# Patient Record
Sex: Male | Born: 1945 | Race: White | Hispanic: No | Marital: Married | Smoking: Never smoker
Health system: Southern US, Community
[De-identification: ages and names within clinical notes are randomized; demographics above are authoritative.]

## PROBLEM LIST (undated history)

## (undated) ENCOUNTER — Emergency Department (HOSPITAL_COMMUNITY): Payer: PPO | Source: Home / Self Care

## (undated) DIAGNOSIS — E782 Mixed hyperlipidemia: Secondary | ICD-10-CM

## (undated) DIAGNOSIS — E8881 Metabolic syndrome: Secondary | ICD-10-CM

## (undated) DIAGNOSIS — IMO0001 Reserved for inherently not codable concepts without codable children: Secondary | ICD-10-CM

## (undated) DIAGNOSIS — Q211 Atrial septal defect: Secondary | ICD-10-CM

## (undated) DIAGNOSIS — E1165 Type 2 diabetes mellitus with hyperglycemia: Secondary | ICD-10-CM

## (undated) DIAGNOSIS — Z8672 Personal history of thrombophlebitis: Secondary | ICD-10-CM

## (undated) DIAGNOSIS — M109 Gout, unspecified: Secondary | ICD-10-CM

## (undated) DIAGNOSIS — Z8673 Personal history of transient ischemic attack (TIA), and cerebral infarction without residual deficits: Secondary | ICD-10-CM

## (undated) DIAGNOSIS — Z86718 Personal history of other venous thrombosis and embolism: Secondary | ICD-10-CM

## (undated) DIAGNOSIS — G8929 Other chronic pain: Secondary | ICD-10-CM

## (undated) DIAGNOSIS — G4730 Sleep apnea, unspecified: Secondary | ICD-10-CM

## (undated) DIAGNOSIS — M5136 Other intervertebral disc degeneration, lumbar region: Secondary | ICD-10-CM

## (undated) DIAGNOSIS — M545 Low back pain, unspecified: Secondary | ICD-10-CM

## (undated) DIAGNOSIS — M51369 Other intervertebral disc degeneration, lumbar region without mention of lumbar back pain or lower extremity pain: Secondary | ICD-10-CM

## (undated) DIAGNOSIS — E669 Obesity, unspecified: Secondary | ICD-10-CM

## (undated) DIAGNOSIS — Z86711 Personal history of pulmonary embolism: Secondary | ICD-10-CM

## (undated) DIAGNOSIS — D6859 Other primary thrombophilia: Secondary | ICD-10-CM

## (undated) DIAGNOSIS — R413 Other amnesia: Secondary | ICD-10-CM

## (undated) DIAGNOSIS — I639 Cerebral infarction, unspecified: Secondary | ICD-10-CM

## (undated) DIAGNOSIS — Q2112 Patent foramen ovale: Secondary | ICD-10-CM

## (undated) DIAGNOSIS — I10 Essential (primary) hypertension: Secondary | ICD-10-CM

## (undated) HISTORY — DX: Metabolic syndrome: E88.81

## (undated) HISTORY — DX: Essential (primary) hypertension: I10

## (undated) HISTORY — DX: Other intervertebral disc degeneration, lumbar region without mention of lumbar back pain or lower extremity pain: M51.369

## (undated) HISTORY — DX: Sleep apnea, unspecified: G47.30

## (undated) HISTORY — DX: Gout, unspecified: M10.9

## (undated) HISTORY — DX: Other intervertebral disc degeneration, lumbar region: M51.36

## (undated) HISTORY — DX: Metabolic syndrome: E88.810

## (undated) HISTORY — DX: Low back pain, unspecified: M54.50

## (undated) HISTORY — DX: Other amnesia: R41.3

## (undated) HISTORY — DX: Atrial septal defect: Q21.1

## (undated) HISTORY — DX: Mixed hyperlipidemia: E78.2

## (undated) HISTORY — DX: Low back pain: M54.5

## (undated) HISTORY — DX: Other chronic pain: G89.29

## (undated) HISTORY — DX: Type 2 diabetes mellitus with hyperglycemia: E11.65

## (undated) HISTORY — DX: Personal history of thrombophlebitis: Z86.72

## (undated) HISTORY — DX: Patent foramen ovale: Q21.12

## (undated) HISTORY — PX: WRIST SURGERY: SHX841

## (undated) HISTORY — DX: Personal history of pulmonary embolism: Z86.711

## (undated) HISTORY — DX: Obesity, unspecified: E66.9

## (undated) HISTORY — DX: Other primary thrombophilia: D68.59

## (undated) HISTORY — DX: Reserved for inherently not codable concepts without codable children: IMO0001

## (undated) HISTORY — DX: Cerebral infarction, unspecified: I63.9

## (undated) HISTORY — DX: Personal history of transient ischemic attack (TIA), and cerebral infarction without residual deficits: Z86.73

## (undated) HISTORY — DX: Personal history of other venous thrombosis and embolism: Z86.718

---

## 1998-11-06 HISTORY — PX: COLONOSCOPY: SHX174

## 1998-11-08 LAB — HM COLONOSCOPY

## 1998-12-18 ENCOUNTER — Emergency Department (HOSPITAL_COMMUNITY): Admission: EM | Admit: 1998-12-18 | Discharge: 1998-12-18 | Payer: Self-pay | Admitting: Emergency Medicine

## 1998-12-18 ENCOUNTER — Encounter: Payer: Self-pay | Admitting: Emergency Medicine

## 2000-08-18 ENCOUNTER — Encounter: Payer: Self-pay | Admitting: Emergency Medicine

## 2000-08-19 ENCOUNTER — Inpatient Hospital Stay (HOSPITAL_COMMUNITY): Admission: EM | Admit: 2000-08-19 | Discharge: 2000-08-20 | Payer: Self-pay | Admitting: Emergency Medicine

## 2000-08-31 ENCOUNTER — Ambulatory Visit (HOSPITAL_BASED_OUTPATIENT_CLINIC_OR_DEPARTMENT_OTHER): Admission: RE | Admit: 2000-08-31 | Discharge: 2000-08-31 | Payer: Self-pay | Admitting: Internal Medicine

## 2001-01-12 ENCOUNTER — Emergency Department (HOSPITAL_COMMUNITY): Admission: EM | Admit: 2001-01-12 | Discharge: 2001-01-12 | Payer: Self-pay | Admitting: Emergency Medicine

## 2002-07-21 ENCOUNTER — Encounter: Payer: Self-pay | Admitting: Emergency Medicine

## 2002-07-21 ENCOUNTER — Emergency Department (HOSPITAL_COMMUNITY): Admission: EM | Admit: 2002-07-21 | Discharge: 2002-07-21 | Payer: Self-pay | Admitting: Emergency Medicine

## 2002-11-12 ENCOUNTER — Encounter: Payer: Self-pay | Admitting: Internal Medicine

## 2002-11-12 ENCOUNTER — Inpatient Hospital Stay (HOSPITAL_COMMUNITY): Admission: EM | Admit: 2002-11-12 | Discharge: 2002-11-19 | Payer: Self-pay | Admitting: Emergency Medicine

## 2002-11-14 ENCOUNTER — Encounter: Payer: Self-pay | Admitting: Internal Medicine

## 2003-02-11 ENCOUNTER — Emergency Department (HOSPITAL_COMMUNITY): Admission: EM | Admit: 2003-02-11 | Discharge: 2003-02-11 | Payer: Self-pay | Admitting: Emergency Medicine

## 2004-09-27 ENCOUNTER — Ambulatory Visit: Payer: Self-pay | Admitting: Internal Medicine

## 2004-11-11 ENCOUNTER — Ambulatory Visit: Payer: Self-pay | Admitting: Internal Medicine

## 2004-12-21 ENCOUNTER — Ambulatory Visit: Payer: Self-pay | Admitting: Internal Medicine

## 2005-01-02 ENCOUNTER — Ambulatory Visit: Payer: Self-pay | Admitting: Family Medicine

## 2005-01-02 ENCOUNTER — Emergency Department (HOSPITAL_COMMUNITY): Admission: EM | Admit: 2005-01-02 | Discharge: 2005-01-02 | Payer: Self-pay | Admitting: Emergency Medicine

## 2005-01-05 ENCOUNTER — Ambulatory Visit: Payer: Self-pay | Admitting: Internal Medicine

## 2005-01-13 ENCOUNTER — Ambulatory Visit: Payer: Self-pay | Admitting: Internal Medicine

## 2005-03-09 ENCOUNTER — Ambulatory Visit: Payer: Self-pay | Admitting: Internal Medicine

## 2005-04-06 ENCOUNTER — Ambulatory Visit: Payer: Self-pay | Admitting: Internal Medicine

## 2005-04-26 ENCOUNTER — Ambulatory Visit: Payer: Self-pay | Admitting: Internal Medicine

## 2005-06-21 ENCOUNTER — Ambulatory Visit: Payer: Self-pay | Admitting: Internal Medicine

## 2005-11-16 ENCOUNTER — Ambulatory Visit: Payer: Self-pay | Admitting: Internal Medicine

## 2006-02-02 ENCOUNTER — Encounter (INDEPENDENT_AMBULATORY_CARE_PROVIDER_SITE_OTHER): Payer: Self-pay | Admitting: *Deleted

## 2006-02-02 ENCOUNTER — Ambulatory Visit: Payer: Self-pay | Admitting: Internal Medicine

## 2006-02-02 LAB — CONVERTED CEMR LAB: PSA: 0.52 ng/mL

## 2006-03-09 ENCOUNTER — Ambulatory Visit: Payer: Self-pay | Admitting: Internal Medicine

## 2006-03-12 ENCOUNTER — Encounter: Admission: RE | Admit: 2006-03-12 | Discharge: 2006-03-12 | Payer: Self-pay | Admitting: Internal Medicine

## 2006-04-18 ENCOUNTER — Ambulatory Visit: Payer: Self-pay | Admitting: Internal Medicine

## 2006-06-01 ENCOUNTER — Ambulatory Visit: Payer: Self-pay | Admitting: Internal Medicine

## 2006-07-20 ENCOUNTER — Ambulatory Visit: Payer: Self-pay | Admitting: Internal Medicine

## 2006-07-24 ENCOUNTER — Ambulatory Visit: Payer: Self-pay | Admitting: Internal Medicine

## 2006-08-06 ENCOUNTER — Ambulatory Visit: Payer: Self-pay | Admitting: Internal Medicine

## 2006-08-23 ENCOUNTER — Ambulatory Visit: Payer: Self-pay | Admitting: Internal Medicine

## 2006-08-23 LAB — CONVERTED CEMR LAB: INR: 2.5 — ABNORMAL HIGH (ref 0.9–2.0)

## 2006-09-12 ENCOUNTER — Ambulatory Visit: Payer: Self-pay | Admitting: Internal Medicine

## 2006-09-12 LAB — CONVERTED CEMR LAB: Prothrombin Time: 22 s — ABNORMAL HIGH (ref 10.0–14.0)

## 2006-11-01 ENCOUNTER — Ambulatory Visit: Payer: Self-pay | Admitting: Internal Medicine

## 2006-11-01 LAB — CONVERTED CEMR LAB
AST: 25 units/L (ref 0–37)
Creatinine, Ser: 1 mg/dL (ref 0.4–1.5)
Creatinine,U: 104.2 mg/dL
HDL: 44.9 mg/dL (ref >39.0)
Hgb A1c MFr Bld: 6 % (ref 4.6–6.0)
Microalb, Ur: 0.3 mg/dL (ref 0.0–1.9)
Potassium: 4 meq/L (ref 3.5–5.1)
Triglyceride fasting, serum: 137 mg/dL (ref 0–149)

## 2006-12-03 ENCOUNTER — Ambulatory Visit: Payer: Self-pay | Admitting: Internal Medicine

## 2006-12-06 ENCOUNTER — Ambulatory Visit: Payer: Self-pay | Admitting: Internal Medicine

## 2006-12-21 ENCOUNTER — Encounter: Admission: RE | Admit: 2006-12-21 | Discharge: 2006-12-21 | Payer: Self-pay | Admitting: Internal Medicine

## 2006-12-21 ENCOUNTER — Ambulatory Visit: Payer: Self-pay | Admitting: Internal Medicine

## 2007-01-24 ENCOUNTER — Ambulatory Visit: Payer: Self-pay | Admitting: Internal Medicine

## 2007-01-24 LAB — CONVERTED CEMR LAB
INR: 2.5 — ABNORMAL HIGH (ref 0.9–2.0)
Prothrombin Time: 19.7 s — ABNORMAL HIGH (ref 10.0–14.0)

## 2007-02-28 ENCOUNTER — Ambulatory Visit: Payer: Self-pay | Admitting: Internal Medicine

## 2007-02-28 LAB — CONVERTED CEMR LAB
INR: 2.6 — ABNORMAL HIGH (ref 0.9–2.0)
Prothrombin Time: 20.4 s — ABNORMAL HIGH (ref 10.0–14.0)

## 2007-04-03 ENCOUNTER — Ambulatory Visit: Payer: Self-pay | Admitting: Internal Medicine

## 2007-04-09 ENCOUNTER — Ambulatory Visit: Payer: Self-pay | Admitting: Internal Medicine

## 2007-04-09 ENCOUNTER — Telehealth (INDEPENDENT_AMBULATORY_CARE_PROVIDER_SITE_OTHER): Payer: Self-pay | Admitting: *Deleted

## 2007-04-09 ENCOUNTER — Observation Stay (HOSPITAL_COMMUNITY): Admission: EM | Admit: 2007-04-09 | Discharge: 2007-04-10 | Payer: Self-pay | Admitting: Emergency Medicine

## 2007-04-23 ENCOUNTER — Ambulatory Visit: Payer: Self-pay | Admitting: Cardiology

## 2007-04-25 ENCOUNTER — Ambulatory Visit: Payer: Self-pay | Admitting: Internal Medicine

## 2007-05-14 ENCOUNTER — Ambulatory Visit: Payer: Self-pay | Admitting: Internal Medicine

## 2007-05-24 ENCOUNTER — Ambulatory Visit: Payer: Self-pay | Admitting: Internal Medicine

## 2007-07-30 ENCOUNTER — Ambulatory Visit: Payer: Self-pay | Admitting: Internal Medicine

## 2007-07-30 LAB — CONVERTED CEMR LAB
INR: 4.5 — ABNORMAL HIGH (ref 0.8–1.0)
Prothrombin Time: 27.1 s — ABNORMAL HIGH (ref 10.9–13.3)

## 2007-08-05 ENCOUNTER — Telehealth (INDEPENDENT_AMBULATORY_CARE_PROVIDER_SITE_OTHER): Payer: Self-pay | Admitting: *Deleted

## 2007-08-16 ENCOUNTER — Ambulatory Visit: Payer: Self-pay | Admitting: Internal Medicine

## 2007-09-18 ENCOUNTER — Ambulatory Visit: Payer: Self-pay | Admitting: Internal Medicine

## 2007-09-20 LAB — CONVERTED CEMR LAB
INR: 2.5 — ABNORMAL HIGH (ref 0.8–1.0)
Prothrombin Time: 19.8 s — ABNORMAL HIGH (ref 10.9–13.3)

## 2007-10-24 ENCOUNTER — Ambulatory Visit: Payer: Self-pay | Admitting: Internal Medicine

## 2007-10-25 LAB — CONVERTED CEMR LAB
INR: 2.3 — ABNORMAL HIGH (ref 0.8–1.0)
Prothrombin Time: 18.9 s — ABNORMAL HIGH (ref 10.9–13.3)

## 2007-11-06 ENCOUNTER — Telehealth (INDEPENDENT_AMBULATORY_CARE_PROVIDER_SITE_OTHER): Payer: Self-pay | Admitting: *Deleted

## 2007-11-07 DIAGNOSIS — Z8673 Personal history of transient ischemic attack (TIA), and cerebral infarction without residual deficits: Secondary | ICD-10-CM

## 2007-11-07 HISTORY — DX: Personal history of transient ischemic attack (TIA), and cerebral infarction without residual deficits: Z86.73

## 2007-12-05 ENCOUNTER — Telehealth (INDEPENDENT_AMBULATORY_CARE_PROVIDER_SITE_OTHER): Payer: Self-pay | Admitting: *Deleted

## 2007-12-05 ENCOUNTER — Ambulatory Visit: Payer: Self-pay | Admitting: Internal Medicine

## 2007-12-05 DIAGNOSIS — E782 Mixed hyperlipidemia: Secondary | ICD-10-CM

## 2007-12-09 ENCOUNTER — Encounter (INDEPENDENT_AMBULATORY_CARE_PROVIDER_SITE_OTHER): Payer: Self-pay | Admitting: *Deleted

## 2007-12-09 DIAGNOSIS — Z86718 Personal history of other venous thrombosis and embolism: Secondary | ICD-10-CM

## 2007-12-09 DIAGNOSIS — Z8672 Personal history of thrombophlebitis: Secondary | ICD-10-CM

## 2007-12-09 DIAGNOSIS — E8881 Metabolic syndrome: Secondary | ICD-10-CM | POA: Insufficient documentation

## 2007-12-09 DIAGNOSIS — M109 Gout, unspecified: Secondary | ICD-10-CM

## 2007-12-18 ENCOUNTER — Telehealth (INDEPENDENT_AMBULATORY_CARE_PROVIDER_SITE_OTHER): Payer: Self-pay | Admitting: *Deleted

## 2007-12-20 ENCOUNTER — Ambulatory Visit: Payer: Self-pay | Admitting: Internal Medicine

## 2007-12-24 ENCOUNTER — Encounter (INDEPENDENT_AMBULATORY_CARE_PROVIDER_SITE_OTHER): Payer: Self-pay | Admitting: *Deleted

## 2007-12-24 LAB — CONVERTED CEMR LAB: Prothrombin Time: 20.3 s — ABNORMAL HIGH (ref 10.9–13.3)

## 2008-02-21 ENCOUNTER — Ambulatory Visit: Payer: Self-pay | Admitting: Internal Medicine

## 2008-05-01 ENCOUNTER — Ambulatory Visit: Payer: Self-pay | Admitting: Internal Medicine

## 2008-05-01 LAB — CONVERTED CEMR LAB: INR: 2.1 — ABNORMAL HIGH (ref 0.8–1.0)

## 2008-05-04 ENCOUNTER — Telehealth (INDEPENDENT_AMBULATORY_CARE_PROVIDER_SITE_OTHER): Payer: Self-pay | Admitting: *Deleted

## 2008-05-07 ENCOUNTER — Ambulatory Visit: Payer: Self-pay | Admitting: Internal Medicine

## 2008-05-07 LAB — CONVERTED CEMR LAB
Bilirubin, Direct: 0.1 mg/dL (ref 0.0–0.3)
Cholesterol: 204 mg/dL (ref 0–200)
Direct LDL: 140.3 mg/dL
Total Bilirubin: 0.8 mg/dL (ref 0.3–1.2)

## 2008-05-14 ENCOUNTER — Ambulatory Visit: Payer: Self-pay | Admitting: Internal Medicine

## 2008-05-14 DIAGNOSIS — D6859 Other primary thrombophilia: Secondary | ICD-10-CM | POA: Insufficient documentation

## 2008-05-14 DIAGNOSIS — E118 Type 2 diabetes mellitus with unspecified complications: Secondary | ICD-10-CM

## 2008-05-14 DIAGNOSIS — M5416 Radiculopathy, lumbar region: Secondary | ICD-10-CM

## 2008-05-14 HISTORY — DX: Other primary thrombophilia: D68.59

## 2008-05-14 LAB — CONVERTED CEMR LAB: HDL goal, serum: 40 mg/dL

## 2008-06-11 ENCOUNTER — Telehealth (INDEPENDENT_AMBULATORY_CARE_PROVIDER_SITE_OTHER): Payer: Self-pay | Admitting: *Deleted

## 2008-06-15 ENCOUNTER — Encounter: Payer: Self-pay | Admitting: Internal Medicine

## 2008-06-18 ENCOUNTER — Ambulatory Visit (HOSPITAL_COMMUNITY): Admission: RE | Admit: 2008-06-18 | Discharge: 2008-06-18 | Payer: Self-pay | Admitting: Specialist

## 2008-06-19 ENCOUNTER — Encounter: Admission: RE | Admit: 2008-06-19 | Discharge: 2008-06-19 | Payer: Self-pay | Admitting: Specialist

## 2008-07-24 ENCOUNTER — Ambulatory Visit: Payer: Self-pay | Admitting: Internal Medicine

## 2008-07-27 ENCOUNTER — Telehealth (INDEPENDENT_AMBULATORY_CARE_PROVIDER_SITE_OTHER): Payer: Self-pay | Admitting: *Deleted

## 2008-08-14 ENCOUNTER — Ambulatory Visit: Payer: Self-pay | Admitting: Internal Medicine

## 2008-08-16 LAB — CONVERTED CEMR LAB
ALT: 27 units/L (ref 0–53)
Albumin: 3.6 g/dL (ref 3.5–5.2)
Bilirubin, Direct: 0.1 mg/dL (ref 0.0–0.3)
INR: 2.7 — ABNORMAL HIGH (ref 0.8–1.0)
Total Bilirubin: 0.8 mg/dL (ref 0.3–1.2)
Total Protein: 6.8 g/dL (ref 6.0–8.3)

## 2008-08-19 ENCOUNTER — Telehealth (INDEPENDENT_AMBULATORY_CARE_PROVIDER_SITE_OTHER): Payer: Self-pay | Admitting: *Deleted

## 2008-08-26 ENCOUNTER — Ambulatory Visit: Payer: Self-pay | Admitting: Internal Medicine

## 2008-08-26 ENCOUNTER — Inpatient Hospital Stay (HOSPITAL_COMMUNITY): Admission: EM | Admit: 2008-08-26 | Discharge: 2008-08-28 | Payer: Self-pay | Admitting: Emergency Medicine

## 2008-08-27 ENCOUNTER — Ambulatory Visit: Payer: Self-pay | Admitting: Internal Medicine

## 2008-08-27 ENCOUNTER — Ambulatory Visit: Payer: Self-pay | Admitting: Vascular Surgery

## 2008-08-27 ENCOUNTER — Encounter (INDEPENDENT_AMBULATORY_CARE_PROVIDER_SITE_OTHER): Payer: Self-pay | Admitting: Emergency Medicine

## 2008-08-28 ENCOUNTER — Encounter: Admission: RE | Admit: 2008-08-28 | Discharge: 2008-08-28 | Payer: Self-pay | Admitting: Internal Medicine

## 2008-09-01 ENCOUNTER — Telehealth (INDEPENDENT_AMBULATORY_CARE_PROVIDER_SITE_OTHER): Payer: Self-pay | Admitting: *Deleted

## 2008-09-03 ENCOUNTER — Ambulatory Visit: Payer: Self-pay | Admitting: Internal Medicine

## 2008-09-03 DIAGNOSIS — Z8679 Personal history of other diseases of the circulatory system: Secondary | ICD-10-CM

## 2008-09-03 LAB — CONVERTED CEMR LAB: INR: 3.1

## 2008-09-16 ENCOUNTER — Encounter: Payer: Self-pay | Admitting: Internal Medicine

## 2008-09-28 ENCOUNTER — Ambulatory Visit: Payer: Self-pay | Admitting: Internal Medicine

## 2008-09-28 ENCOUNTER — Encounter (INDEPENDENT_AMBULATORY_CARE_PROVIDER_SITE_OTHER): Payer: Self-pay | Admitting: *Deleted

## 2008-09-29 ENCOUNTER — Encounter (INDEPENDENT_AMBULATORY_CARE_PROVIDER_SITE_OTHER): Payer: Self-pay | Admitting: *Deleted

## 2008-09-29 ENCOUNTER — Telehealth (INDEPENDENT_AMBULATORY_CARE_PROVIDER_SITE_OTHER): Payer: Self-pay | Admitting: *Deleted

## 2008-09-29 LAB — CONVERTED CEMR LAB
Prothrombin Time: 42 s — ABNORMAL HIGH (ref 10.9–13.3)
Uric Acid, Serum: 7.1 mg/dL (ref 4.0–7.8)

## 2008-10-05 ENCOUNTER — Ambulatory Visit: Payer: Self-pay | Admitting: Internal Medicine

## 2008-10-05 DIAGNOSIS — T887XXA Unspecified adverse effect of drug or medicament, initial encounter: Secondary | ICD-10-CM

## 2008-10-05 LAB — CONVERTED CEMR LAB: INR: 2.6

## 2008-10-12 ENCOUNTER — Telehealth: Payer: Self-pay | Admitting: Internal Medicine

## 2008-10-22 ENCOUNTER — Ambulatory Visit: Payer: Self-pay | Admitting: Internal Medicine

## 2008-10-22 DIAGNOSIS — R1011 Right upper quadrant pain: Secondary | ICD-10-CM

## 2008-10-23 ENCOUNTER — Ambulatory Visit: Payer: Self-pay | Admitting: Internal Medicine

## 2008-10-23 LAB — CONVERTED CEMR LAB
AST: 23 units/L (ref 0–37)
Alkaline Phosphatase: 57 units/L (ref 39–117)
Basophils Relative: 0.2 % (ref 0.0–3.0)
Bilirubin, Direct: 0.2 mg/dL (ref 0.0–0.3)
Eosinophils Relative: 2.2 % (ref 0.0–5.0)
Lymphocytes Relative: 25.6 % (ref 12.0–46.0)
MCHC: 34.2 g/dL (ref 30.0–36.0)
MCV: 87.7 fL (ref 78.0–100.0)
Platelets: 214 10*3/uL (ref 150–400)
RBC: 4.75 M/uL (ref 4.22–5.81)
Total Bilirubin: 1.1 mg/dL (ref 0.3–1.2)
WBC: 7.2 10*3/uL (ref 4.5–10.5)

## 2008-10-25 ENCOUNTER — Emergency Department (HOSPITAL_COMMUNITY): Admission: EM | Admit: 2008-10-25 | Discharge: 2008-10-25 | Payer: Self-pay | Admitting: Emergency Medicine

## 2008-10-26 ENCOUNTER — Encounter (INDEPENDENT_AMBULATORY_CARE_PROVIDER_SITE_OTHER): Payer: Self-pay | Admitting: *Deleted

## 2008-11-02 ENCOUNTER — Telehealth: Payer: Self-pay | Admitting: Internal Medicine

## 2008-11-02 DIAGNOSIS — S381XXA Crushing injury of abdomen, lower back, and pelvis, initial encounter: Secondary | ICD-10-CM | POA: Insufficient documentation

## 2008-11-03 ENCOUNTER — Telehealth: Payer: Self-pay | Admitting: Internal Medicine

## 2008-11-03 ENCOUNTER — Encounter: Admission: RE | Admit: 2008-11-03 | Discharge: 2008-11-03 | Payer: Self-pay | Admitting: Internal Medicine

## 2008-11-04 ENCOUNTER — Ambulatory Visit: Payer: Self-pay | Admitting: Internal Medicine

## 2008-11-04 DIAGNOSIS — S32009A Unspecified fracture of unspecified lumbar vertebra, initial encounter for closed fracture: Secondary | ICD-10-CM | POA: Insufficient documentation

## 2008-11-10 ENCOUNTER — Encounter: Payer: Self-pay | Admitting: Internal Medicine

## 2008-11-16 ENCOUNTER — Telehealth (INDEPENDENT_AMBULATORY_CARE_PROVIDER_SITE_OTHER): Payer: Self-pay | Admitting: *Deleted

## 2008-11-17 ENCOUNTER — Ambulatory Visit: Payer: Self-pay | Admitting: Internal Medicine

## 2008-11-17 LAB — CONVERTED CEMR LAB
Anti Nuclear Antibody(ANA): NEGATIVE
Homocysteine: 14.1 micromoles/L (ref 4.0–15.4)

## 2008-11-24 LAB — CONVERTED CEMR LAB
Hgb A1c MFr Bld: 5.8 % (ref 4.6–6.0)
INR: 4.7 — ABNORMAL HIGH (ref 0.8–1.0)
Prothrombin Time: 46.1 s — ABNORMAL HIGH (ref 10.9–13.3)
TSH: 0.71 microintl units/mL (ref 0.35–5.50)
Total CHOL/HDL Ratio: 4.6
VLDL: 29 mg/dL (ref 0–40)
Vitamin B-12: 279 pg/mL (ref 211–911)

## 2008-11-26 ENCOUNTER — Ambulatory Visit: Payer: Self-pay | Admitting: Internal Medicine

## 2008-12-03 ENCOUNTER — Telehealth (INDEPENDENT_AMBULATORY_CARE_PROVIDER_SITE_OTHER): Payer: Self-pay | Admitting: *Deleted

## 2008-12-16 ENCOUNTER — Encounter: Payer: Self-pay | Admitting: Internal Medicine

## 2008-12-17 ENCOUNTER — Telehealth (INDEPENDENT_AMBULATORY_CARE_PROVIDER_SITE_OTHER): Payer: Self-pay | Admitting: *Deleted

## 2008-12-17 ENCOUNTER — Ambulatory Visit: Payer: Self-pay | Admitting: Internal Medicine

## 2009-01-01 ENCOUNTER — Telehealth (INDEPENDENT_AMBULATORY_CARE_PROVIDER_SITE_OTHER): Payer: Self-pay | Admitting: *Deleted

## 2009-01-29 ENCOUNTER — Encounter: Payer: Self-pay | Admitting: Internal Medicine

## 2009-02-10 ENCOUNTER — Ambulatory Visit (HOSPITAL_COMMUNITY): Admission: RE | Admit: 2009-02-10 | Discharge: 2009-02-10 | Payer: Self-pay | Admitting: Cardiology

## 2009-02-10 ENCOUNTER — Encounter (INDEPENDENT_AMBULATORY_CARE_PROVIDER_SITE_OTHER): Payer: Self-pay | Admitting: Cardiology

## 2009-02-15 ENCOUNTER — Telehealth (INDEPENDENT_AMBULATORY_CARE_PROVIDER_SITE_OTHER): Payer: Self-pay | Admitting: *Deleted

## 2009-02-16 ENCOUNTER — Encounter: Payer: Self-pay | Admitting: Internal Medicine

## 2009-02-23 ENCOUNTER — Ambulatory Visit: Payer: Self-pay | Admitting: Internal Medicine

## 2009-02-24 ENCOUNTER — Telehealth (INDEPENDENT_AMBULATORY_CARE_PROVIDER_SITE_OTHER): Payer: Self-pay | Admitting: *Deleted

## 2009-02-24 LAB — CONVERTED CEMR LAB
INR: 3.9 — ABNORMAL HIGH (ref 0.8–1.0)
Prothrombin Time: 39.1 s — ABNORMAL HIGH (ref 10.9–13.3)

## 2009-03-26 ENCOUNTER — Ambulatory Visit: Payer: Self-pay | Admitting: Internal Medicine

## 2009-04-02 ENCOUNTER — Telehealth (INDEPENDENT_AMBULATORY_CARE_PROVIDER_SITE_OTHER): Payer: Self-pay | Admitting: *Deleted

## 2009-04-07 LAB — CONVERTED CEMR LAB
ALT: 23 units/L (ref 0–53)
AST: 31 units/L (ref 0–37)
Albumin: 4 g/dL (ref 3.5–5.2)
Cholesterol: 184 mg/dL (ref 0–200)
HDL: 50.5 mg/dL (ref >39.00)
Hgb A1c MFr Bld: 5.9 % (ref 4.6–6.5)
Prothrombin Time: 27.3 s — ABNORMAL HIGH (ref 10.9–13.3)
Total Bilirubin: 1.1 mg/dL (ref 0.3–1.2)
Total Protein: 7.3 g/dL (ref 6.0–8.3)
Triglycerides: 97 mg/dL (ref 0.0–149.0)
VLDL: 19.4 mg/dL (ref 0.0–40.0)

## 2009-04-08 ENCOUNTER — Ambulatory Visit: Payer: Self-pay | Admitting: Internal Medicine

## 2009-04-08 DIAGNOSIS — Q2112 Patent foramen ovale: Secondary | ICD-10-CM | POA: Insufficient documentation

## 2009-04-08 DIAGNOSIS — G4733 Obstructive sleep apnea (adult) (pediatric): Secondary | ICD-10-CM | POA: Insufficient documentation

## 2009-04-08 DIAGNOSIS — Q211 Atrial septal defect: Secondary | ICD-10-CM

## 2009-04-08 DIAGNOSIS — G4730 Sleep apnea, unspecified: Secondary | ICD-10-CM

## 2009-04-16 ENCOUNTER — Telehealth (INDEPENDENT_AMBULATORY_CARE_PROVIDER_SITE_OTHER): Payer: Self-pay | Admitting: *Deleted

## 2009-05-06 ENCOUNTER — Ambulatory Visit: Payer: Self-pay | Admitting: Internal Medicine

## 2009-05-06 LAB — CONVERTED CEMR LAB: INR: 2.4

## 2009-07-01 ENCOUNTER — Telehealth (INDEPENDENT_AMBULATORY_CARE_PROVIDER_SITE_OTHER): Payer: Self-pay | Admitting: *Deleted

## 2009-07-09 ENCOUNTER — Ambulatory Visit: Payer: Self-pay | Admitting: Internal Medicine

## 2009-07-18 LAB — CONVERTED CEMR LAB
ALT: 21 units/L (ref 0–53)
Bilirubin, Direct: 0.1 mg/dL (ref 0.0–0.3)
HDL: 52.5 mg/dL (ref >39.00)
INR: 2.3 — ABNORMAL HIGH (ref 0.8–1.0)
LDL Cholesterol: 86 mg/dL (ref 0–99)
Prothrombin Time: 23.8 s — ABNORMAL HIGH (ref 9.1–11.7)
Total Bilirubin: 1.1 mg/dL (ref 0.3–1.2)
VLDL: 14.6 mg/dL (ref 0.0–40.0)

## 2009-07-21 ENCOUNTER — Telehealth (INDEPENDENT_AMBULATORY_CARE_PROVIDER_SITE_OTHER): Payer: Self-pay | Admitting: *Deleted

## 2009-07-22 ENCOUNTER — Ambulatory Visit: Payer: Self-pay | Admitting: Internal Medicine

## 2009-08-06 ENCOUNTER — Encounter: Payer: Self-pay | Admitting: Internal Medicine

## 2009-08-27 ENCOUNTER — Telehealth (INDEPENDENT_AMBULATORY_CARE_PROVIDER_SITE_OTHER): Payer: Self-pay | Admitting: *Deleted

## 2009-09-10 ENCOUNTER — Telehealth (INDEPENDENT_AMBULATORY_CARE_PROVIDER_SITE_OTHER): Payer: Self-pay | Admitting: *Deleted

## 2009-09-10 ENCOUNTER — Ambulatory Visit: Payer: Self-pay | Admitting: Internal Medicine

## 2009-09-10 LAB — CONVERTED CEMR LAB: Prothrombin Time: 26.7 s — ABNORMAL HIGH (ref 9.1–11.7)

## 2009-09-27 ENCOUNTER — Telehealth: Payer: Self-pay | Admitting: Internal Medicine

## 2009-11-26 ENCOUNTER — Ambulatory Visit: Payer: Self-pay | Admitting: Internal Medicine

## 2009-11-28 LAB — CONVERTED CEMR LAB: Prothrombin Time: 18.6 s — ABNORMAL HIGH (ref 9.1–11.7)

## 2009-11-29 ENCOUNTER — Telehealth (INDEPENDENT_AMBULATORY_CARE_PROVIDER_SITE_OTHER): Payer: Self-pay | Admitting: *Deleted

## 2009-12-14 ENCOUNTER — Telehealth (INDEPENDENT_AMBULATORY_CARE_PROVIDER_SITE_OTHER): Payer: Self-pay | Admitting: *Deleted

## 2009-12-23 ENCOUNTER — Ambulatory Visit: Payer: Self-pay | Admitting: Internal Medicine

## 2009-12-24 ENCOUNTER — Telehealth (INDEPENDENT_AMBULATORY_CARE_PROVIDER_SITE_OTHER): Payer: Self-pay | Admitting: *Deleted

## 2010-01-06 ENCOUNTER — Encounter: Payer: Self-pay | Admitting: Internal Medicine

## 2010-01-28 ENCOUNTER — Ambulatory Visit: Payer: Self-pay | Admitting: Internal Medicine

## 2010-01-28 ENCOUNTER — Telehealth (INDEPENDENT_AMBULATORY_CARE_PROVIDER_SITE_OTHER): Payer: Self-pay | Admitting: *Deleted

## 2010-01-28 LAB — CONVERTED CEMR LAB
INR: 2.8 — ABNORMAL HIGH (ref 0.8–1.0)
Prothrombin Time: 29.1 s — ABNORMAL HIGH (ref 9.1–11.7)

## 2010-02-07 ENCOUNTER — Encounter: Payer: Self-pay | Admitting: Internal Medicine

## 2010-03-09 ENCOUNTER — Encounter: Payer: Self-pay | Admitting: Internal Medicine

## 2010-03-17 ENCOUNTER — Encounter: Payer: Self-pay | Admitting: Internal Medicine

## 2010-03-25 ENCOUNTER — Telehealth (INDEPENDENT_AMBULATORY_CARE_PROVIDER_SITE_OTHER): Payer: Self-pay | Admitting: *Deleted

## 2010-04-22 ENCOUNTER — Telehealth (INDEPENDENT_AMBULATORY_CARE_PROVIDER_SITE_OTHER): Payer: Self-pay | Admitting: *Deleted

## 2010-05-03 ENCOUNTER — Ambulatory Visit: Payer: Self-pay | Admitting: Internal Medicine

## 2010-05-04 ENCOUNTER — Telehealth (INDEPENDENT_AMBULATORY_CARE_PROVIDER_SITE_OTHER): Payer: Self-pay | Admitting: *Deleted

## 2010-05-04 LAB — CONVERTED CEMR LAB
Creatinine, Ser: 0.8 mg/dL (ref 0.4–1.5)
Hgb A1c MFr Bld: 6 % (ref 4.6–6.5)
INR: 2.2 — ABNORMAL HIGH (ref 0.8–1.0)
Potassium: 4.9 meq/L (ref 3.5–5.1)

## 2010-05-05 ENCOUNTER — Encounter (INDEPENDENT_AMBULATORY_CARE_PROVIDER_SITE_OTHER): Payer: Self-pay | Admitting: *Deleted

## 2010-05-05 ENCOUNTER — Telehealth: Payer: Self-pay | Admitting: Family Medicine

## 2010-05-12 ENCOUNTER — Ambulatory Visit: Payer: Self-pay | Admitting: Internal Medicine

## 2010-05-13 ENCOUNTER — Telehealth (INDEPENDENT_AMBULATORY_CARE_PROVIDER_SITE_OTHER): Payer: Self-pay | Admitting: *Deleted

## 2010-06-15 ENCOUNTER — Telehealth: Payer: Self-pay | Admitting: Internal Medicine

## 2010-06-22 ENCOUNTER — Ambulatory Visit: Payer: Self-pay | Admitting: Internal Medicine

## 2010-09-01 ENCOUNTER — Ambulatory Visit: Payer: Self-pay | Admitting: Internal Medicine

## 2010-09-01 ENCOUNTER — Telehealth (INDEPENDENT_AMBULATORY_CARE_PROVIDER_SITE_OTHER): Payer: Self-pay | Admitting: *Deleted

## 2010-09-01 LAB — CONVERTED CEMR LAB: INR: 2.5 — ABNORMAL HIGH (ref 0.8–1.0)

## 2010-10-10 ENCOUNTER — Ambulatory Visit: Payer: Self-pay | Admitting: Internal Medicine

## 2010-10-11 LAB — CONVERTED CEMR LAB: Prothrombin Time: 22.4 s — ABNORMAL HIGH (ref 9.7–11.8)

## 2010-11-01 ENCOUNTER — Telehealth (INDEPENDENT_AMBULATORY_CARE_PROVIDER_SITE_OTHER): Payer: Self-pay | Admitting: *Deleted

## 2010-11-10 ENCOUNTER — Ambulatory Visit
Admission: RE | Admit: 2010-11-10 | Discharge: 2010-11-10 | Payer: Self-pay | Source: Home / Self Care | Attending: Internal Medicine | Admitting: Internal Medicine

## 2010-11-10 ENCOUNTER — Other Ambulatory Visit: Payer: Self-pay | Admitting: Internal Medicine

## 2010-11-10 ENCOUNTER — Telehealth (INDEPENDENT_AMBULATORY_CARE_PROVIDER_SITE_OTHER): Payer: Self-pay | Admitting: *Deleted

## 2010-11-10 LAB — PROTIME-INR
INR: 2.6 ratio — ABNORMAL HIGH (ref 0.8–1.0)
Prothrombin Time: 27.8 s — ABNORMAL HIGH (ref 9.7–11.8)

## 2010-11-14 ENCOUNTER — Other Ambulatory Visit: Payer: Self-pay | Admitting: Internal Medicine

## 2010-11-14 ENCOUNTER — Ambulatory Visit
Admission: RE | Admit: 2010-11-14 | Discharge: 2010-11-14 | Payer: Self-pay | Source: Home / Self Care | Attending: Internal Medicine | Admitting: Internal Medicine

## 2010-11-14 LAB — LIPID PANEL
Cholesterol: 178 mg/dL (ref 0–200)
HDL: 52.1 mg/dL (ref >39.00)
LDL Cholesterol: 105 mg/dL — ABNORMAL HIGH (ref 0–99)
Total CHOL/HDL Ratio: 3
Triglycerides: 106 mg/dL (ref 0.0–149.0)
VLDL: 21.2 mg/dL (ref 0.0–40.0)

## 2010-11-14 LAB — BUN: BUN: 14 mg/dL (ref 6–23)

## 2010-11-14 LAB — HEMOGLOBIN A1C: Hgb A1c MFr Bld: 6.4 % (ref 4.6–6.5)

## 2010-11-14 LAB — CREATININE, SERUM: Creatinine, Ser: 0.9 mg/dL (ref 0.4–1.5)

## 2010-11-14 LAB — POTASSIUM: Potassium: 4.4 mEq/L (ref 3.5–5.1)

## 2010-11-18 ENCOUNTER — Ambulatory Visit
Admission: RE | Admit: 2010-11-18 | Discharge: 2010-11-18 | Payer: Self-pay | Source: Home / Self Care | Attending: Internal Medicine | Admitting: Internal Medicine

## 2010-11-30 ENCOUNTER — Telehealth: Payer: Self-pay | Admitting: Internal Medicine

## 2010-12-08 NOTE — Progress Notes (Signed)
Summary: REFILL REQUEST  Phone Note Refill Request Call back at [phone] Message from:  Pharmacy on May 05, 2010 8:16 AM  Refills Requested: Medication #1:  GABAPENTIN 100 MG CAPS 1 q 8 hrs as needed pain   Dosage confirmed as above?Dosage Confirmed   Supply Requested: 1 month   Last Refilled: 03/25/2010 [facility]  Next Appointment Scheduled: JULY 7TH 2011 Initial call taken by: Lavell Islam,  May 05, 2010 8:16 AM  Follow-up for Phone Call        Last filled 03-25-10 #90, last Ov 07-22-09.Marland KitchenMarland KitchenMarland KitchenFelecia Deloach CMA  May 05, 2010 9:33 AM   Additional Follow-up for Phone Call Additional follow up Details #1::        needs OV.  ok for # 90, no refills Additional Follow-up by: Neena Rhymes MD,  May 05, 2010 9:35 AM    Additional Follow-up for Phone Call Additional follow up Details #2::    letter mailed .....Marland KitchenMarland KitchenFelecia Deloach CMA  May 05, 2010 10:10 AM   New/Updated Medications: GABAPENTIN 100 MG CAPS (GABAPENTIN) 1 q 8 hrs as needed pain*OFFICE VISIT DUE NOW** Prescriptions: GABAPENTIN 100 MG CAPS (GABAPENTIN) 1 q 8 hrs as needed pain*OFFICE VISIT DUE NOW**  #90 x 0   Entered by:   Jeremy Johann CMA   Authorized by:   Neena Rhymes MD   Signed by:   Jeremy Johann CMA on 05/05/2010   Method used:   Faxed to ...       [facility]* (retail)       [address]       [place], [place]  [address]       Ph: [phone]       Fax: [phone]   RxID:   [id number]

## 2010-12-08 NOTE — Letter (Signed)
Summary: Primary Care Appointment Letter  [facility] [place]  [address] [place], [place] [address]   Phone: [phone]  Fax: [phone]    05/05/2010 MRN: [id number]  Logan Valdez [address] [place], [place]  [address]  Dear Mr. SWIM,   Your Primary Care Physician Marga Melnick MD has indicated that:    __X_____it is time to schedule an appointment.    _______you missed your appointment on______ and need to call and          reschedule.    _______you need to have lab work done.    _______you need to schedule an appointment discuss lab or test results.    _______you need to call to reschedule your appointment that is                       scheduled on _________.     Please call our office as soon as possible. Our phone number is [phone]. Please press option 1. Our office is open 8a-12noon and 1p-5p, Monday through Friday.     Thank you,    [facility]

## 2010-12-08 NOTE — Progress Notes (Signed)
Summary: PT/INR Results  Phone Note Outgoing Call   Call placed by: Shonna Chock,  May 04, 2010 8:14 AM Call placed to: Patient Summary of Call: Left message on machine for patient to return call when avaliable, Reason for call:  PT/INR 2.2 (good), ? current dose of Coumadin, ? missed doses, ? chage in diet   Chrae [facility]  May 04, 2010 8:17 AM   Follow-up for Phone Call        no change ; repeat 1 month. I recommend OV before refilling Metformin as A1c is in NON DM range . Bring all glucose readings to that visit. Follow-up by: Marga Melnick MD,  May 04, 2010 8:42 AM  Additional Follow-up for Phone Call Additional follow up Details #1::        Left message for pt to return phone call. Army Fossa CMA  May 05, 2010 12:03 PM DISCUSS WITH PATIENT.............Marland KitchenFelecia Deloach CMA  May 05, 2010 5:04 PM

## 2010-12-08 NOTE — Progress Notes (Signed)
Summary: REFILL REQUEST  Phone Note Refill Request Call back at [phone] Message from:  Pharmacy on May 13, 2010 8:21 AM  Refills Requested: Medication #1:  TOPROL XL 100 MG  TB24 1/2 tab qd   Dosage confirmed as above?Dosage Confirmed   Supply Requested: 3 months   Last Refilled: 04/13/2010 [facility]  Next Appointment Scheduled: NONE Initial call taken by: Lavell Islam,  May 13, 2010 8:23 AM    Prescriptions: TOPROL XL 100 MG  TB24 (METOPROLOL SUCCINATE) 1/2 tab qd  #90 x 2   Entered by:   Shonna Chock   Authorized by:   Marga Melnick MD   Signed by:   Shonna Chock on 05/13/2010   Method used:   Electronically to        [facility]* (retail)       [address]       [place], [place]  [address]       Ph: [phone]       Fax: [phone]   RxID:   [id number]

## 2010-12-08 NOTE — Progress Notes (Signed)
Summary: refill  Phone Note Refill Request Message from:  Fax from Pharmacy on September 01, 2010 10:15 AM  Refills Requested: Medication #1:  WARFARIN SODIUM 5 MG  TABS take as directed 1 by mouth once daily except 1 1/2 on [facility] - fax [phone]  Initial call taken by: Okey Regal Spring,  September 01, 2010 10:16 AM  Follow-up for Phone Call        I spoke with patient's wife and asked who is checking PT. Last PT 06/22/10 with indication recheck in 4 weeks.  Patient's wife indicated that Dr.Hopper is monitoring PT and patient forgot to go get it rechecked and she will have him go to [facility] today for PT because tomorrow [place] are [place]. Follow-up by: Shonna Chock CMA,  September 01, 2010 10:35 AM  Additional Follow-up for Phone Call Additional follow up Details #1::        PT/INR 2.5  Taking 5mg  tab daily except 7.5mg  on Wed , patient's wife informed to have patient keep med the same and recheck in 4 week ( Mrs.Selbe indicated she will mark it on her calender)  Additional Follow-up by: Shonna Chock CMA,  September 01, 2010 2:11 PM    Prescriptions: WARFARIN SODIUM 5 MG  TABS (WARFARIN SODIUM) take as directed 1 by mouth once daily except 1 1/2 on Wed  #45 x 1   Entered by:   Shonna Chock CMA   Authorized by:   Marga Melnick MD   Signed by:   Shonna Chock CMA on 09/01/2010   Method used:   Electronically to        [facility]* (retail)       [address]       [place], [place]  [address]       Ph: [phone]       Fax: [phone]   RxID:   [id number]

## 2010-12-08 NOTE — Assessment & Plan Note (Signed)
Summary: FOLLOWUP/KN   Vital Signs:  Patient profile:   65 year old male Weight:      271.8 pounds Temp:     98.4 degrees F oral Pulse rate:   64 / minute Resp:     15 per minute BP sitting:   114 / 76  (left arm) Cuff size:   large  Vitals Entered By: Shonna Chock CMA (November 18, 2010 9:41 AM) CC: Follow-up visit: discuss labs (copy given). Patient not sure if he missed a dose of coumadin or not and would like to know if he should take a dose or just count it as a possible skipped dose. , Type 2 diabetes mellitus follow-up   CC:  Follow-up visit: discuss labs (copy given). Patient not sure if he missed a dose of coumadin or not and would like to know if he should take a dose or just count it as a possible skipped dose.  and Type 2 diabetes mellitus follow-up.  History of Present Illness: Hyperlipidemia Follow-Up      This is a 65 year old man who presents for Hyperlipidemia follow-up.  Labs reviewed & risks discussed. The patient denies muscle aches, GI upset, abdominal pain, flushing, itching, constipation, diarrhea, and fatigue.  The patient denies the following symptoms: chest pain/pressure, dypsnea, palpitations, syncope, and pedal edema.  Compliance with medications (by patient report) has been near 100%.  Dietary compliance has been good except over holidays.  The patient reports exercising occasionally.  LDL 105 on Crestor 20 mg 1/2 3X/week ; cost will go up. Type 2 Diabetes Mellitus Follow-Up      The patient is also here for Type 2 diabetes mellitus follow-up.  The patient reports weight gain of 1# over holidays, but denies polyuria, polydipsia, blurred vision, self managed hypoglycemia, and numbness of extremities.  The patient denies the following symptoms: neuropathic pain, orthostatic symptoms, poor wound healing, intermittent claudication, vision loss, and foot ulcer.  Since the last visit the patient reports not monitoring blood glucose.  Since the last visit, the patient  reports having had eye care by an ophthalmologist.  A1c was 6.4%( 137 average sugar , risk of 28%).  Allergies: 1)  ! * Indomethacin 2)  ! * Hctz 3)  ! * Lisinopril 4)  ! * Micardis 5)  ! Pravastatin Sodium (Pravastatin Sodium)  Physical Exam  General:  in no acute distress; alert,appropriate and cooperative throughout examination Lungs:  Normal respiratory effort, chest expands symmetrically. Lungs are clear to auscultation, no crackles or wheezes. Heart:  Normal rate and regular rhythm. S1 and S2 normal without gallop, murmur, click, rub. S4 Pulses:  R and L carotid,radial,dorsalis pedis and posterior tibial pulses are full and equal bilaterally Extremities:  No clubbing, cyanosis, edema, or deformity noted . Neurologic:  alert ; 11/18/2009";  sensation intact to light touch over feet.   Skin:  Intact without suspicious lesions or rashes Psych:  normally interactive, good eye contact, and not anxious appearing.     Impression & Recommendations:  Problem # 1:  DIABETES MELLITUS, CONTROLLED (ICD-250.00)  His updated medication list for this problem includes:    Metformin Hcl 500 Mg Tabs (Metformin hcl) .Marland Kitchen... 1 by mouth once daily with largest meal  Problem # 2:  HYPERLIPIDEMIA (ICD-272.2)  The following medications were removed from the medication list:    Crestor 20 Mg Tabs (Rosuvastatin calcium) .Marland Kitchen... 1/2 m,w & f His updated medication list for this problem includes:    Simvastatin 20 Mg Tabs (  Simvastatin) .Marland Kitchen... 1/2 at bedtime .Note: cost to rise  Problem # 3:  MEMORY LOSS (ICD-780.93) stable  Problem # 4:  PRIMARY HYPERCOAGULABLE STATE (ICD-289.81) Coumadin must be lifelong  Complete Medication List: 1)  Toprol Xl 100 Mg Tb24 (Metoprolol succinate) .... 1/2 tab qd 2)  Warfarin Sodium 5 Mg Tabs (Warfarin sodium) .... Take as directed 1 by mouth once daily except 1 1/2 on wed 3)  Metformin Hcl 500 Mg Tabs (Metformin hcl) .Marland Kitchen.. 1 by mouth once daily with largest meal 4)   Hydrocodone-acetaminophen 10-325 Mg/74ml Soln (Hydrocodone-acetaminophen) .Marland Kitchen.. 1 by mouth every 6 hours as needed 5)  Gabapentin 100 Mg Caps (Gabapentin) .Marland Kitchen.. 1 q 8 hrs as needed pain*office visit due now** 6)  Stool Softner  .Marland Kitchen.. 1 by mouth as needed 7)  Metanx 3-35-2 Mg Tabs (L-methylfolate-b6-b12) .Marland Kitchen.. 1 by mouth once daily 8)  Simvastatin 20 Mg Tabs (Simvastatin) .... 1/2 at bedtime  Patient Instructions: 1)  Check your blood sugars regularly. If your readings are usually above :150  or below 90 you should contact our office. 2)  It is important that your Diabetic A1c level is checked every 6 months. 3)  See your eye doctor yearly to check for diabetic eye damage. 4)  Check your feet each night for sore areas, calluses or signs of infection. 5)  Please schedule a follow-up appointment in 6 months. 6)  Hepatic Panel ; 7)  Lipid Panel ; 8)  HbgA1C ; & 9)  Urine Microalbumin prior to visit. Prescriptions: SIMVASTATIN 20 MG TABS (SIMVASTATIN) 1/2 at bedtime  #30 x 3   Entered and Authorized by:   Marga Melnick MD   Signed by:   Marga Melnick MD on 11/18/2010   Method used:   Print then Give to Patient   RxID:   [id number] METFORMIN HCL 500 MG  TABS (METFORMIN HCL) 1 by mouth once daily with largest meal  #90 x 1   Entered and Authorized by:   Marga Melnick MD   Signed by:   Marga Melnick MD on 11/18/2010   Method used:   Print then Give to Patient   RxID:   [id number]    Orders Added: 1)  Est. Patient Level IV [28413]

## 2010-12-08 NOTE — Progress Notes (Signed)
Summary: PT/INR Results   Phone Note Outgoing Call Call back at Home Phone [phone]   Call placed by: Shonna Chock CMA,  November 10, 2010 4:58 PM Call placed to: Patient Details for Reason: PT/INR 2.6 Summary of Call: Left message on machine for patient to return call when avaliable, Reason for call: no change ; recheck 4 weeks  Chrae [facility] CMA  November 10, 2010 4:58 PM   Follow-up for Phone Call        spoke w/ patient wife aware of resuts ...Marland KitchenMarland KitchenDoristine Devoid CMA  November 14, 2010 9:58 AM

## 2010-12-08 NOTE — Progress Notes (Signed)
Summary: PT/INR Results  Phone Note Outgoing Call Call back at Home Phone [phone]   Call placed by: Shonna Chock,  November 29, 2009 11:42 AM Call placed to: Patient Details for Reason: PT/INR  Summary of Call: Left message on machine for patient to return call when avaliable, Reason for call:    he needs to increase coumadin by 2.5 mg one day of week ; recheck 4 weeks  Chrae [facility]  November 29, 2009 11:44 AM    Follow-up for Phone Call        Patient takes 5mg  daily except Tues 7.5mg , Wed 2.5mg , Saturday 7.5mg    Per Dr.Hopper 7.5mg  on Tues/Sat, 5mg  all other days  and recheck in 4 weeks  Patient verbalized he understood instruction Follow-up by: Shonna Chock,  November 29, 2009 1:53 PM

## 2010-12-08 NOTE — Progress Notes (Signed)
Summary: Lab Results  Phone Note Outgoing Call Call back at [facility] Phone [phone]   Call placed by: Shonna Chock,  January 28, 2010 10:38 AM Details for Reason: INR: 2.8 Summary of Call: Left message on Home VM informing patient to keep meds the same and recheck labs in 4 weeks.   Chrae Malloy  January 28, 2010 10:39 AM

## 2010-12-08 NOTE — Progress Notes (Signed)
Summary: Refill--Gabapentin  Phone Note Refill Request Message from:  Fax from Pharmacy on June 15, 2010 1:23 PM  Refills Requested: Medication #1:  GABAPENTIN 100 MG CAPS 1 q 8 hrs as needed pain*OFFICE VISIT DUE NOW** [facility] [phone]  Initial call taken by: Okey Regal Spring,  June 15, 2010 1:24 PM    Prescriptions: GABAPENTIN 100 MG CAPS (GABAPENTIN) 1 q 8 hrs as needed pain*OFFICE VISIT DUE NOW**  #90 x 0   Entered by:   Lucious Groves CMA   Authorized by:   Marga Melnick MD   Signed by:   Lucious Groves CMA on 06/15/2010   Method used:   Faxed to ...       [facility]* (retail)       [address]       [place], [place]  [address]       Ph: [phone]       Fax: [phone]   RxID:   [id number]

## 2010-12-08 NOTE — Progress Notes (Signed)
Summary: Metformin question  Phone Note Call from Patient Call back at Home Phone [phone]   Caller: Norma Fredrickson Summary of Call: At last office visit patient was given Metformin prescription. Spouse notes that this was stopped 05/2010 (verified via patient instructions). She would like to know if this meant that he should re-start the med or continue to stay off it. Please advise. Initial call taken by: Lucious Groves CMA,  November 30, 2010 10:40 AM  Follow-up for Phone Call        leave it off ,but check A1c in 3 months(250.00) Follow-up by: Marga Melnick MD,  November 30, 2010 1:09 PM  Additional Follow-up for Phone Call Additional follow up Details #1::        Patient spouse notified. Additional Follow-up by: Lucious Groves CMA,  November 30, 2010 2:52 PM    New/Updated Medications: METFORMIN HCL 500 MG  TABS (METFORMIN HCL) 1 by mouth once daily with largest meal (D/C ed 07/11)

## 2010-12-08 NOTE — Progress Notes (Signed)
Summary: Refill Request  Phone Note Refill Request Call back at [phone] Message from:  Pharmacy on November 01, 2010 10:56 AM  Refills Requested: Medication #1:  WARFARIN SODIUM 5 MG  TABS take as directed 1 by mouth once daily except 1 1/2 on Wed   Dosage confirmed as above?Dosage Confirmed   Supply Requested: #45   Last Refilled: 10/10/2010 [facility]  Next Appointment Scheduled: none Initial call taken by: Harold Barban,  November 01, 2010 10:56 AM  Follow-up for Phone Call        I called and spoke with patient's wife and informed her appointment due 11/10/2009 or a few days before for PT/INR check and  other labs (see 05/2010 OV/Patient Instructions) Follow-up by: Shonna Chock CMA,  November 01, 2010 3:15 PM    Prescriptions: WARFARIN SODIUM 5 MG  TABS (WARFARIN SODIUM) take as directed 1 by mouth once daily except 1 1/2 on Wed  #45 x 1   Entered by:   Shonna Chock CMA   Authorized by:   Marga Melnick MD   Signed by:   Shonna Chock CMA on 11/01/2010   Method used:   Electronically to        [facility]* (retail)       [address]       [place], [place]  [address]       Ph: [phone]       Fax: [phone]   RxID:   [id number]

## 2010-12-08 NOTE — Progress Notes (Signed)
Summary: Rash- Waiting for Return Call  Phone Note Call from Patient Call back at [phone] ext [phone]   Summary of Call: received call from patient wife stating that patient has noticed small rash on legs and upper thigh thought it may be from pain medication given by another physician called pharmacy to see if manufactures had change or ingredient change which it hadn't informed wife I don't think that this may be from medication she says he was outside doing some yard work also and was having a hard time trying to convince him to get it check out. Wife ask if I would call patient at home called patient left message on machine to return call.......Marland KitchenDoristine Devoid  April 22, 2010 2:05 PM   Follow-up for Phone Call        Left message for pt to return call. Follow-up by: Josph Macho RMA,  April 25, 2010 11:19 AM  Additional Follow-up for Phone Call Additional follow up Details #1::        Spoke with patient and patient indicated that his rash is gone. Patient does not feel the need for an OV at this time. Patient was informed that if rash re-appears he needs to contact the Dr. that rx'ed the med first then [place] if he is unable to be seen by that Dr. patient agreed.  I called patient's wife at work and left message for her to return call to share what me and her husband discussed  Additional Follow-up by: Shonna Chock,  April 26, 2010 8:13 AM    Additional Follow-up for Phone Call Additional follow up Details #2::    I spoke with patient's wife and informed her what me and Mr.Vonbargen discussed earlier. Follow-up by: Shonna Chock,  April 26, 2010 4:57 PM

## 2010-12-08 NOTE — Letter (Signed)
Summary: Clarene Critchley OD  Clarene Critchley OD   Imported By: Lanelle Bal 02/14/2010 08:47:55  _____________________________________________________________________  External Attachment:    Type:   Image     Comment:   External Document

## 2010-12-08 NOTE — Assessment & Plan Note (Signed)
Summary: rto review lab/cbs   Vital Signs:  Patient profile:   65 year old male Weight:      259.4 pounds Pulse rate:   60 / minute Resp:     17 per minute BP sitting:   128 / 70  (left arm) Cuff size:   large  Vitals Entered By: Shonna Chock (May 12, 2010 2:56 PM) CC: Follow-up visit; Discuss Labs and how to take Gabapentin, Type 2 diabetes mellitus follow-up Comments REVIEWED MED LIST, PATIENT AGREED DOSE AND INSTRUCTION CORRECT    CC:  Follow-up visit; Discuss Labs and how to take Gabapentin and Type 2 diabetes mellitus follow-up.  History of Present Illness:        This is a 65 year old man who presents for Type 2 diabetes mellitus follow-up.  The patient denies polyuria, polydipsia, blurred vision, self managed hypoglycemia, and numbness of extremities.  The patient denies the following symptoms: neuropathic pain, chest pain, vomiting, orthostatic symptoms, poor wound healing, intermittent claudication, vision loss, and foot ulcer.  Since the last visit the patient reports poor dietary compliance with 16# weight gain, compliance with medications, not exercising regularly, and not monitoring blood glucose.  Since the last visit, the patient reports having had eye care by an ophthalmologist and no foot care.    Allergies: 1)  ! * Indomethacin 2)  ! * Hctz 3)  ! * Lisinopril 4)  ! * Micardis 5)  ! Pravastatin Sodium (Pravastatin Sodium)  Physical Exam  General:  well-nourished alert,appropriate and cooperative throughout examination Lungs:  Normal respiratory effort, chest expands symmetrically. Lungs are clear to auscultation, no crackles or wheezes. Heart:  normal rate, regular rhythm, no gallop, no rub, no JVD, and grade 1/2  /6 systolic murmur.   Pulses:  R and L carotid,radial,dorsalis pedis and posterior tibial pulses are full and equal bilaterally Neurologic:  alert  but unable to give date Skin:  Intact without suspicious lesions or rashes Psych:  normally  interactive, good eye contact, and not anxious appearing.     Impression & Recommendations:  Problem # 1:  DIABETES MELLITUS, CONTROLLED (ICD-250.00) A1c now  in non Diabetes range His updated medication list for this problem includes:    Metformin Hcl 500 Mg Tabs (Metformin hcl) .Marland Kitchen... 1 by mouth once daily **appointment due**  Problem # 2:  MEMORY LOSS (ICD-780.93) stable as per spouse  Complete Medication List: 1)  Toprol Xl 100 Mg Tb24 (Metoprolol succinate) .... 1/2 tab qd 2)  Warfarin Sodium 5 Mg Tabs (Warfarin sodium) .... Take as directed 1 by mouth once daily except 1 1/2 on wed 3)  Metformin Hcl 500 Mg Tabs (Metformin hcl) .Marland Kitchen.. 1 by mouth once daily **appointment due** 4)  Hydrocodone-acetaminophen 10-325 Mg/46ml Soln (Hydrocodone-acetaminophen) .Marland Kitchen.. 1 by mouth every 6 hours as needed 5)  Gabapentin 100 Mg Caps (Gabapentin) .Marland Kitchen.. 1 q 8 hrs as needed pain*office visit due now** 6)  Stool Softner  .Marland Kitchen.. 1 by mouth as needed 7)  Crestor 20 Mg Tabs (Rosuvastatin calcium) .... 1/2 m,w & f 8)  Metanx 3-35-2 Mg Tabs (L-methylfolate-b6-b12) .Marland Kitchen.. 1 by mouth once daily  Patient Instructions: 1)  Consume LESS THAN 40 grams of "sugar" /day from foods & drinks with High fructose Corn Syrup as #1,2 or #3 on label. Stop Metformin . PT/INR  recheck  in 1 month.  2)  Please schedule a follow-up appointment in 6 months. 3)  BUN,creat,K+ prior to visit, ICD-9:250.00 4)  Lipid Panel prior to  visit, ICD-9:272.2 5)  HbgA1C prior to visit, ICD-9:250.00 6)  It is important that you exercise regularly at least 20 minutes 5 times a week. If you develop chest pain, have severe difficulty breathing, or feel very tired , stop exercising immediately and seek medical attention.

## 2010-12-08 NOTE — Progress Notes (Signed)
Summary: Refill Request  Phone Note Refill Request Message from:  Pharmacy on [facility] Fax #: [phone]  Refills Requested: Medication #1:  WARFARIN SODIUM 5 MG  TABS take as directed   Dosage confirmed as above?Dosage Confirmed Initial call taken by: Harold Barban,  December 24, 2009 8:21 AM    Prescriptions: WARFARIN SODIUM 5 MG  TABS (WARFARIN SODIUM) take as directed  #45 Each x 5   Entered by:   Shonna Chock   Authorized by:   Marga Melnick MD   Signed by:   Shonna Chock on 12/24/2009   Method used:   Electronically to        [facility]* (retail)       [address]       [place], [place]  [address]       Ph: [phone]       Fax: [phone]   RxID:   [id number]

## 2010-12-08 NOTE — Progress Notes (Signed)
Summary: Appointment Concerns  Phone Note Call from Patient Call back at Home Phone [phone]   Caller: Spouse Summary of Call: Message left on voicemail: Please call     I called and spoke with patient, patient said he would like to cancle appointment here to check PT/INR and get it done at [facility]. I ok'd and will cancle appointment for here and place patient on [facility] schedule./Chrae [facility]  December 14, 2009 5:23 PM     Additional Follow-up for Phone Call Additional follow up Details #2::    patient scheduled.Marland KitchenMarland KitchenMarland KitchenBarb Merino  December 15, 2009 8:25 AM  Follow-up by: Barb Merino,  December 15, 2009 8:25 AM

## 2010-12-08 NOTE — Progress Notes (Signed)
Summary: Refill Request  Phone Note Refill Request Call back at [phone] Message from:  Pharmacy on Mar 25, 2010 8:54 AM  Refills Requested: Medication #1:  GABAPENTIN 100 MG CAPS 1 q 8 hrs as needed pain   Dosage confirmed as above?Dosage Confirmed   Supply Requested: 1 month   Last Refilled: 02/11/2010 [facility]  Next Appointment Scheduled: none Initial call taken by: Harold Barban,  Mar 25, 2010 8:54 AM    Prescriptions: GABAPENTIN 100 MG CAPS (GABAPENTIN) 1 q 8 hrs as needed pain  #90 x 0   Entered by:   Shonna Chock   Authorized by:   Marga Melnick MD   Signed by:   Shonna Chock on 03/25/2010   Method used:   Electronically to        [facility]* (retail)       [address]       [place], [place]  [address]       Ph: [phone]       Fax: [phone]   RxID:   [id number]

## 2010-12-09 ENCOUNTER — Telehealth: Payer: Self-pay | Admitting: Internal Medicine

## 2010-12-12 ENCOUNTER — Encounter: Payer: Self-pay | Admitting: Internal Medicine

## 2010-12-14 NOTE — Progress Notes (Signed)
Summary: OTC med recommendation  Phone Note Call from Patient Call back at Home Phone [phone]   Caller: Spouse-Inez  Summary of Call: Patient family member calling noting that he has a cold and they would like to know which OTC med you recommend for the patient due to his conditions and meds (coumadin). Please advise. Initial call taken by: Lucious Groves CMA,  December 09, 2010 12:15 PM  Follow-up for Phone Call        Mucinex DM, Zicam or Airborne Follow-up by: Marga Melnick MD,  December 09, 2010 1:19 PM  Additional Follow-up for Phone Call Additional follow up Details #1::        Patient spouse notified. Additional Follow-up by: Lucious Groves CMA,  December 09, 2010 3:01 PM

## 2010-12-28 NOTE — Letter (Signed)
Summary: [employer]  [employer]   Imported By: Maryln Gottron 12/22/2010 08:35:48  _____________________________________________________________________  External Attachment:    Type:   Image     Comment:   External Document

## 2011-01-02 ENCOUNTER — Telehealth (INDEPENDENT_AMBULATORY_CARE_PROVIDER_SITE_OTHER): Payer: Self-pay | Admitting: *Deleted

## 2011-01-05 ENCOUNTER — Encounter (INDEPENDENT_AMBULATORY_CARE_PROVIDER_SITE_OTHER): Payer: Self-pay | Admitting: *Deleted

## 2011-01-05 ENCOUNTER — Other Ambulatory Visit: Payer: Self-pay | Admitting: Internal Medicine

## 2011-01-05 ENCOUNTER — Other Ambulatory Visit: Payer: Medicare Other

## 2011-01-05 DIAGNOSIS — Z79899 Other long term (current) drug therapy: Secondary | ICD-10-CM

## 2011-01-05 DIAGNOSIS — Z86718 Personal history of other venous thrombosis and embolism: Secondary | ICD-10-CM

## 2011-01-05 DIAGNOSIS — Z8672 Personal history of thrombophlebitis: Secondary | ICD-10-CM

## 2011-01-05 DIAGNOSIS — T887XXA Unspecified adverse effect of drug or medicament, initial encounter: Secondary | ICD-10-CM

## 2011-01-05 LAB — PROTIME-INR: Prothrombin Time: 34.1 s — ABNORMAL HIGH (ref 10.2–12.4)

## 2011-01-06 ENCOUNTER — Telehealth: Payer: Self-pay | Admitting: Internal Medicine

## 2011-01-10 ENCOUNTER — Encounter: Payer: Self-pay | Admitting: Internal Medicine

## 2011-01-12 NOTE — Progress Notes (Signed)
Summary: Refill  Phone Note Refill Request Message from:  Pharmacy on January 02, 2011 12:05 PM  Refills Requested: Medication #1:  WARFARIN SODIUM 5 MG  TABS take as directed 1 by mouth once daily except 1 1/2 on Wed   Dosage confirmed as above?Dosage Confirmed   Supply Requested: 1 month [facility]  Next Appointment Scheduled: none Initial call taken by: Lavell Islam,  January 02, 2011 12:06 PM  Follow-up for Phone Call        I left message for patient to call and schedule PT/INR (Nurse Visit) appointment  Follow-up by: Shonna Chock CMA,  January 02, 2011 1:14 PM    Prescriptions: WARFARIN SODIUM 5 MG  TABS (WARFARIN SODIUM) take as directed 1 by mouth once daily except 1 1/2 on Wed  #45 x 0   Entered by:   Shonna Chock CMA   Authorized by:   Marga Melnick MD   Signed by:   Shonna Chock CMA on 01/02/2011   Method used:   Electronically to        [facility]* (retail)       [address]       [place], [place]  [address]       Ph: [phone]       Fax: [phone]   RxID:   [id number]   Appended Document: Refill Patient's wife indicated patient will get PT checked on Thursday at [facility] in the lab

## 2011-01-12 NOTE — Progress Notes (Signed)
Summary: PT/INR Results  Phone Note Outgoing Call Call back at Home Phone [phone]   Call placed by: Shonna Chock CMA,  January 06, 2011 8:46 AM Call placed to: Patient Details for Reason: PT/INR Results 3.4 Summary of Call: Per Dr.Hieu Herms No change and recheck in 4 weeks: I spoke with patient's wife and she ok'd results, patient is currently taking 1 tab daily except Tue/Sat 1 1/2 (5mg  tab)   .Shonna Chock CMA  January 06, 2011 8:47 AM    Dr.Indigo Barbian PT/INR was 3.4 this time, last PT/INR 2.6 on 11/10/2010. Please verify patient is to keep med the same and recheck in 4 weeks   Follow-up for Phone Call        noted Follow-up by: Marga Melnick MD,  January 06, 2011 12:55 PM

## 2011-01-24 NOTE — Letter (Signed)
Summary: [facility]  [facility]   Imported By: Kassie Mends 01/19/2011 11:03:16  _____________________________________________________________________  External Attachment:    Type:   Image     Comment:   External Document

## 2011-01-27 ENCOUNTER — Other Ambulatory Visit: Payer: Self-pay | Admitting: Internal Medicine

## 2011-01-27 MED ORDER — WARFARIN SODIUM 5 MG PO TABS: 5.0000 mg | ORAL_TABLET | Freq: Every day | ORAL | Status: DC

## 2011-02-07 ENCOUNTER — Other Ambulatory Visit (INDEPENDENT_AMBULATORY_CARE_PROVIDER_SITE_OTHER): Payer: Medicare Other

## 2011-02-07 ENCOUNTER — Telehealth: Payer: Self-pay

## 2011-02-07 DIAGNOSIS — Z8672 Personal history of thrombophlebitis: Secondary | ICD-10-CM

## 2011-02-07 DIAGNOSIS — T887XXA Unspecified adverse effect of drug or medicament, initial encounter: Secondary | ICD-10-CM

## 2011-02-07 DIAGNOSIS — E119 Type 2 diabetes mellitus without complications: Secondary | ICD-10-CM

## 2011-02-07 DIAGNOSIS — Z86718 Personal history of other venous thrombosis and embolism: Secondary | ICD-10-CM

## 2011-02-07 LAB — PROTIME-INR
INR: 2.4 ratio — ABNORMAL HIGH (ref 0.8–1.0)
Prothrombin Time: 24.8 s — ABNORMAL HIGH (ref 10.2–12.4)

## 2011-02-07 NOTE — Telephone Encounter (Signed)
Message copied by Stephan Minister on Tue Feb 07, 2011  4:44 PM ------      Message from: Marga Melnick      Created: Tue Feb 07, 2011  1:07 PM       No change; repeat in 4 weeks

## 2011-02-07 NOTE — Telephone Encounter (Signed)
Spoke with patient and he ok'd Dr.Hopper's instruction to keep meds the same and recheck in 4 weeks

## 2011-02-24 ENCOUNTER — Other Ambulatory Visit: Payer: Self-pay

## 2011-02-24 MED ORDER — WARFARIN SODIUM 5 MG PO TABS: 5.0000 mg | ORAL_TABLET | Freq: Every day | ORAL | Status: DC

## 2011-03-21 ENCOUNTER — Other Ambulatory Visit (INDEPENDENT_AMBULATORY_CARE_PROVIDER_SITE_OTHER): Payer: Medicare Other

## 2011-03-21 ENCOUNTER — Telehealth: Payer: Self-pay

## 2011-03-21 DIAGNOSIS — Z8672 Personal history of thrombophlebitis: Secondary | ICD-10-CM

## 2011-03-21 DIAGNOSIS — Z86718 Personal history of other venous thrombosis and embolism: Secondary | ICD-10-CM

## 2011-03-21 DIAGNOSIS — T887XXA Unspecified adverse effect of drug or medicament, initial encounter: Secondary | ICD-10-CM

## 2011-03-21 NOTE — Assessment & Plan Note (Signed)
[facility]                            CARDIOLOGY OFFICE NOTE   HAWLEY, MICHEL                   MRN:          [id number]  DATE:04/23/2007                            DOB:          1945/12/17    PRIMARY CARDIOLOGIST:  Dr. Gala Romney.   PRIMARY CARE PHYSICIAN:  Dr. Alwyn Ren.   PATIENT PROFILE:  A 65 year old, Caucasian male with a prior history of  nonobstructive CAD who was recently admitted June 3 to June 4 with  vertigo and atypical chest pain.   PROBLEM LIST:  1. History of chest pain.      a.     Nonobstructive coronary artery disease by cardiac       catheterization in 2004.  2. Hypertension.  3. Hyperlipidemia.  4. Type 2 diabetes mellitus.  5. Obesity.  6. Obstructive sleep apnea.  7. 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   ALLERGIES:  INDOCIN.   HISTORY OF PRESENT ILLNESS:  A 65 year old, Caucasian male who was  recently admitted to [facility] secondary to benign positional vertigo  that was successfully treated with meclizine along with atypical chest  pain that appeared for several hours over the weekend prior to  admission. He was evaluated by Dr. Felicity Coyer from Internal Medicine who  recommended the meclizine. Upon discharge, we had initially scheduled a  Myoview; however, after being seen by Dr. Samule Ohm, it was felt the  Myoview was not necessary and this was cancelled. Since his discharge,  he has done reasonably well and has only had to take 2 doses of  Meclizine. Otherwise he has not had any vertigo. He denies any chest  pain or shortness of breath and he is back to working without any  limitations.   HOME MEDICATIONS:  1. Coumadin as directed.  2. Toprol XL 50 mg daily.  3. Amaril 1 mg b.i.d.  4. Metformin 500 mg b.i.d.  5. Pravachol 40 mg daily.   PHYSICAL EXAMINATION:  VITAL SIGNS:  Blood pressure is 124/66, heart  rate 60, he weighs 293 pounds.  GENERAL:  Pleasant, white  male in no acute distress, awake, alert and  oriented x3.  NECK:  No bruits or JVD.  LUNGS:  Respirations regular and unlabored, clear to auscultation.  CARDIAC:  Regular, S1, S2, no S3, S4 or murmurs.  ABDOMEN:  Round, soft, nontender, nondistended. Bowel sounds present x4.  EXTREMITIES:  Warm, dry and pink. No clubbing or cyanosis with 1+  bilateral lower extremity edema.   ACCESSORY CLINICAL FINDINGS:  None.   ASSESSMENT/PLAN:  1. Chest pain. This is felt to be fairly atypical and he has not had      any recurrence. He has a history of nonobstructive coronary disease      by catheterization in 2004.  2. Hypertension. Stable, continue current regimen.  3. Type 2 diabetes mellitus followed by primary care.  4. Hyperlipidemia. He remains on Pravachol therapy and this is well      tolerated. His LDL in the hospital was 91.  5. Benign positional vertigo. Appears to have been  transient and he      has not really needed to take much meclizine. It is recommended      that he followup with Dr. Alwyn Ren.  6. History of deep venous thrombosis and bilateral PE. He remains on      Coumadin therapy and is followed in the Coumadin clinic.  7. Disposition. The patient to followup with Dr. Gala Romney in 3 months      or sooner if necessary.      Nicolasa Ducking, ANP  Electronically Signed      Madolyn Frieze. Jens Som, MD, [facility]  Electronically Signed   CB/MedQ  DD: 04/23/2007  DT: 04/23/2007  Job #: [id number]

## 2011-03-21 NOTE — H&P (Signed)
NAMESHEROD, CISSE            ACCOUNT NO.:  [id number]   MEDICAL RECORD NO.:  [id number]          PATIENT TYPE:  INP   LOCATION:  1844                         FACILITY:  [facility]   PHYSICIAN:  Bevelyn Buckles. Bensimhon, MDDATE OF BIRTH:  [birth year]   DATE OF ADMISSION:  04/09/2007  DATE OF DISCHARGE:                              HISTORY & PHYSICAL   Logan Valdez is a very pleasant 64 year old Caucasian gentleman with  known history of single vessel disease status post cardiac  catheterization in 2001 at which time he was found to have 20-35%  proximal stenosis of the left main coronary artery with normal left  ventricular systolic function.  No further cardiac workup since that  time.  He does have several comorbidities including morbid obesity,  hypercholesteremia, diabetes, hypertension, sleep apnea that is not  treated.  Logan Valdez presented to [facility] emergency room today.  After speaking with Dr. Frederik Pear nurse he apparently has had 2 weeks of  increased fatigue and decreased energy.  He continues, however, to work  as a Advice worker.  Sunday he stated he felt just shaky and weak.  Experienced some intermittent substernal chest pressure.  Had what he  called nearly passing out spell and some intermittent nausea.  Those  symptoms occurred on this past Sunday.  Yesterday, which would be  Monday, the patient went on to work, experienced the same sensation with  mild nausea and dizziness.  The dizziness has been intermittent since  the weekend.  Once again he felt like he might pass out.  He also  complains of some shortness of breath that started this morning.  That  is what led him to call Dr. Alwyn Ren.  He is describing pressure now on  both sides of his head also.  Currently is denying any chest discomfort.  He states the dizziness does not always occur with the chest pressure.   ALLERGIES:  Include intolerance to INDOCIN.  He thinks he had a  presyncopal episode in the past  when he was given Indocin for his back  pain.   MEDICATIONS:  1. Toprol XL 50 daily.  2. Pravastatin 40 daily.  3. Amaryl 2 mg tablet; the patient takes half a tablet b.i.d.  4. Metformin 500 mg daily.  5. Warfarin 7.5 mg daily except for 5 mg on Tuesdays and Thursdays.   PAST MEDICAL HISTORY:  1. Includes arthritis/chronic back and hip pain.  2. Diabetes type 2.  3. Hypertension.  4. Status post DVT in the past with bilateral pulmonary embolism      requiring chronic Coumadin therapy.  5. Nonobstructive coronary artery disease with a 20-30% ostial left      main coronary artery stenosis by cardiac catheterization in 2001.      Ejection fraction at that time 55%.  6. Previous history of alcohol abuse.  7. Morbid obesity.  8. Hypercholesteremia.  9. Sleep apnea.  The patient never followed up after sleep study.  10.Gout.   PAST SURGICAL HISTORY:  No surgical history.   SOCIAL HISTORY:  He lives in [place] with his wife.  He does metal work  for a living.  He has four grown children.  Denies any tobacco or use.  He states he just occasionally has a beer now.  He states he is not  compliant with his ADA diet.  Denies any drug or herbal medication use.   FAMILY HISTORY:  Mother with hypertension.  Father deceased secondary to  complications of COPD.  Brother had a MI and ended up having a bypass at  age 4.   REVIEW OF SYSTEMS:  Positive for sweats, headache, chest pressure,  dyspnea on exertion, presyncopal episodes, weakness, generalized pain in  hips, back, nausea.  All other systems negative per patient.   PHYSICAL EXAMINATION:  Temperature 97.3, heart rate 67, respirations 18,  blood pressure 151/78, weight 288 pounds, blood pressure 98% on room  air.  GENERAL:  In no acute distress, a morbidly obese 65 year old Caucasian  gentleman.  HEENT:  Normocephalic, atraumatic.  Pupils equal, round, react to light.  Sclerae is clear.  NECK:  Supple without lymphadenopathy, no  bruit, no JVD.  CARDIOVASCULAR: Reveals S1-S2, regular rate and rhythm.  LUNGS:  Clear to auscultation with distant breath sounds throughout.  SKIN:  Warm and dry.  ABDOMEN:  Soft, nontender, positive bowel sounds, protuberant.  LOWER EXTREMITIES: Without clubbing, cyanosis or edema.  NEUROLOGIC:  The patient is alert and oriented x3.   Chest x-ray is pending.  EKG:  Sinus rhythm at a rate of 64 without  acute ST or T-wave changes.   BASELINE LAB WORK:  Point of care enzymes x1 set:  Troponin less than  0.05, D-dimer within normal limits.  PT 25.8, INR 2.2, H&H 15 and 44,  WBC 7.6, platelet count 210,000, sodium 138, potassium 4.0, chloride  106, BUN 12, creatinine 0.9, glucose 126.   Dr. Nicholes Mango in to examine and assess patient with nonspecific  chest tightness in the setting of dizziness most likely secondary to  vertigo.  We will treat with meclizine at this time.  The patient will  be admitted to rule out MI.  Discussed the risks and benefits associated  with catheterization versus stress Myoview.  The patient wants to think  about it, however, agrees to be admitted.  Make the patient n.p.o. after  midnight for possible cath in the a.m.  If the patient rules out he may  choose to be discharged and proceed with a stress Myoview outpatient.  We will reevaluate in the a.m. Also will need to review results of his  chest x-ray at that time.  We will hold metformin.  Place the patient on  sliding scale insulin coverage.      Dorian Pod, ACNP      Bevelyn Buckles. Bensimhon, MD  Electronically Signed    MB/MEDQ  D:  04/09/2007  T:  04/09/2007  Job:  [id number]

## 2011-03-21 NOTE — Telephone Encounter (Signed)
Patient's wife is aware of PT/INR results and informed that he needs to schedule an appointment for his next PT/INR (call transferred to scheduler)

## 2011-03-21 NOTE — Telephone Encounter (Signed)
Message copied by Stephan Minister on Tue Mar 21, 2011  3:09 PM ------      Message from: Marga Melnick      Created: Tue Mar 21, 2011  1:26 PM       No change repeat in 4 weeks.
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
## 2011-03-21 NOTE — Discharge Summary (Signed)
NAMEBRAYLN, DUQUE            ACCOUNT NO.:  [id number]   MEDICAL RECORD NO.:  [id number]          PATIENT TYPE:  INP   LOCATION:  4733                         FACILITY:  [facility]   PHYSICIAN:  Salvadore Farber, MD  DATE OF BIRTH:  [birth year]   DATE OF ADMISSION:  04/09/2007  DATE OF DISCHARGE:  04/10/2007                               DISCHARGE SUMMARY   PRIMARY CARDIOLOGIST:  Dr. Gala Romney.   PRIMARY CARE Shizuye Rupert:  Dr. Alwyn Ren.   DISCHARGE DIAGNOSIS:  Benign positional vertigo.   SECONDARY DIAGNOSES:  1. Chest pain with history of nonobstructive coronary artery disease      by cardiac catheterization in 2004.  2. Hypertension.  3. Diabetes mellitus.  4. Hyperlipidemia.  5. Obstructive sleep apnea.  6. 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  7. Obesity.   ALLERGIES:  INDOCIN.   PROCEDURES:  None.   HISTORY OF PRESENT ILLNESS:  This is a 65 year old obese Caucasian male  with the above problem list who since this past weekend has been  experiencing intermittent midsternal chest pressure lasting hours at a  time (as much as 6 hours at a time) and resolving spontaneously.  He has  also felt shaky and weak as well as lightheaded with rotation of his  head.  His lightheadedness worsened on Monday June 2 and then that  evening he developed a headache.  On the morning of admission, June 3,  he continued to have a lightheadedness worse with rotation of his head  as well as weakness and shortness of breath and he called his primary  care Satish Hammers who recommended he present to the ED.  In the ED he was  pain free and his cardiac markers and D-dimer were normal.  He was  admitted for further evaluation.   HOSPITAL COURSE:  The patient's history seemed most consistent with  benign positional vertigo and he was written for p.r.n. meclizine, which  was felt to abate his symptoms.  He initially had a headache this  morning while he was n.p.o. as  well as while he had a nitroglycerin  patch and once he ate, his nitroglycerin patch was removed, his headache  resolved promptly.  He was not found to be orthostatic on exam and he  has had no additional chest discomfort.  His cardiac markers have  remained negative and his ECG is without any acute changes.  Despite  many hours of chest discomfort over the weekend.  We will plan to  discharge him today and have arranged for him to have a 2-day outpatient  exercise Myoview on April 15, 2007 at 9 a.m.  He will then follow-up with  Dr. Prescott Gum nurse practitioner in June 17  at 9:45 a.m.   DISCHARGE LABS:  Hemoglobin.  13.3, hematocrit 39.7, WBC 7.5, platelets  182.  PT 25.3, INR 2.2, PTT 44.  Sodium 137, potassium 3.9, chloride  100, CO2 27, BUN 12, creatinine 0.93, glucose 82, total bilirubin 0.9,  alkaline phosphatase 51, AST 24, ALT 22, total protein 6.1, albumin 3.3,  calcium 8.7, hemoglobin A1c  6.1.  Cardiac enzymes are negative x3.  Total cholesterol 155, triglycerides 110, HDL 42, LDL 91.  TSH 1.950.   DISPOSITION:  The patient is being discharged home today in good  condition.   FOLLOW-UP APPOINTMENTS:  He has an outpatient exercise Myoview on June 9  at 9:00 a.m..  He has been advised to hold his beta-blocker that morning  as well as to remain n.p.o. after midnight.  He has follow-up with Dr.  Prescott Gum nurse practitioner in June 17 at 9:45 a.m. He has follow-up  with Dr. Alwyn Ren in approximately 1 week for further follow-up regarding  vertigo.   DISCHARGE MEDICATIONS:  1. Coumadin as previously prescribed.  2. Toprol XL 50 mg daily.  3. Amaryl 1 mg b.i.d.  4. Metformin 500 mg b.i.d.  5. Pravachol 40 mg daily.  6. Nitroglycerin 0.4 mg sublingual p.r.n. chest pain.  7. Meclizine 25 mg t.i.d. p.r.n. vertigo.   PENDING STUDIES:  None.   DURATION DISCHARGE ENCOUNTER:  Sixty minutes including physician time.      Nicolasa Ducking, ANP      Salvadore Farber, MD   Electronically Signed    CB/MEDQ  D:  04/10/2007  T:  04/11/2007  Job:  [id number]   cc:   Peyton Najjar, MD

## 2011-03-24 NOTE — Cardiovascular Report (Signed)
[place]. [facility]  Patient:    Logan Valdez, Logan Valdez                   MRN: [id number] Proc. Date: 08/20/00 Adm. Date:  [id number] Attending:  Tresa Garter CC:         Corwin Levins, M.D. [facility]  Daisey Must, M.D. [facility]   Cardiac Catheterization  DATE OF BIRTH:  07-Jan-1946  REFERRING PHYSICIAN:  Dr. Jonny Ruiz, phone number [phone]  CARDIOLOGIST:  Dr. Loraine Leriche Pulsipher  PROCEDURES PERFORMED: 1. Left heart catheterization with selective coronary angiography. 2. Left ventriculography.  DIAGNOSES: 1. Nonobstructive coronary artery disease, as outlined below. 2. Normal left ventricular systolic function.  HISTORY:  [name] is a 65 year old white male with multiple risk factors for coronary artery disease.  The patient was admitted with atypical chest pain.  He has ruled out for myocardial infarction by enzymes.  He has been referred for a diagnostic catheterization to assess his coronary anatomy.  DESCRIPTION OF PROCEDURE:  After informed consent was obtained, the patient was brought to the catheterization laboratory.  The right groin was sterilely prepped and draped.  Lidocaine 1% was used to infiltrate the right groin and a 6 French arterial sheath was placed using the modified Seldinger technique. Subsequently a 6 [place] and JR4 catheters were used to engage the left and right coronary arteries.  Selective angiography was then performed in various projections using manual injections of contrast.  After coronary angiography, attention was turned to ventriculography.  A 6 French angled pigtail catheter was placed in the left ventricle.  Appropriated left-sided hemodynamics were obtained.  Subsequently ventriculography was performed in single plane RAO projection using power injection of contrast.  After ventriculography, the pigtail catheter was removed.  At the termination of the case, the catheters and sheaths were removed  and manual pressure applied until adequate hemostasis was achieved.  The patient tolerated the procedure well and was transferred to floor in stable condition.   FINDINGS:  HEMODYNAMICS: 1. Left ventricular pressure 134/19 mmHg. 2. Aortic pressure 133/76 mmHg.  VENTRICULOGRAPHY: 1. Ejection fraction approximately 55%, no segmental wall abnormalities.  2. 2. There was no mitral regurgitation.  LEFT MAIN CORONARY ARTERY:  There appears to be a 20-35% proximal stenosis off the left main coronary artery.  Mild calcification has been noted.  However, there was no pressure dampening upon engagement of the Judkins catheter, and there was normal efflux of dye upon contrast injection.  Stenosis not felt to be hemodynamically significant.  LEFT ANTERIOR DESCENDING ARTERY:  Proximal segment gives rise to two septal perforators.  The LAD proper is rather small.  After the second septal perforator, the LAD bifurcates into a small LAD proper and a large diagonal branch.  There is no obvious flow-limiting coronary artery disease.  LEFT CIRCUMFLEX CORONARY ARTERY:  Demonstrates no evidence of flow-limiting coronary artery disease.  This is a moderate caliber vessel.  RIGHT CORONARY ARTERY:  This was a large vessel.  Giving rise to a large posterior descending artery and a very large posterolateral branch.  There was no evidence of flow-limiting coronary artery disease in the right coronary artery proper or at terminal branches.  CONCLUSION: 1. Nonobstructive coronary artery disease with a 20-30% ostial left main    coronary artery stenosis. 2. Normal LV systolic function. 3. Mildly elevated left ventricular and diastolic pressure.  RECOMMENDATIONS:  The results have been discussed with Dr. Gerri Spore.  The patients chest pains are felt  to be atypical as ______ findings by angiography do not explain his chest pain syndrome.  The plan is to refer the patient to Dr. Jonny Ruiz for further work-up of  his chest pain syndrome.  The patient will be discharged later today. DD:  08/20/00 TD:  08/20/00 Job: [id number]

## 2011-03-24 NOTE — H&P (Signed)
[place]. [facility]  Patient:    Logan Valdez, Logan Valdez                   MRN: [id number] Adm. Date:  [id number] Attending:  Tresa Garter CC:         Corwin Levins, M.D. [facility]   History and Physical  DATE OF BIRTH: Sep 18, 1946  CHIEF COMPLAINT: Chest pain.  HISTORY OF PRESENT ILLNESS: The patient is a 65 year old male who started to develop a heavy sensation in the chest as well as burning, sweating, and some shortness of breath around 5 p.m.  This lasted for several hours.  He presented to the emergency room, and the pain has resolved.  He took three aspirin at home.  He also noted that his left arm and hand were numb for one week.  His symptoms were not exertional.  PAST MEDICAL HISTORY:  1. Chest pain one year ago, work-up negative.  Cardiolite study, according to     the patient.  2. Elevated blood pressure diagnosed by Dr. Jonny Ruiz three to four weeks ago.  He     was started on Altace.  3. Borderline sugar.  No treatment.  CURRENT MEDICATIONS: Altace 5 mg for one month.  ALLERGIES: No known drug allergies.  FAMILY HISTORY: Brother had MI and had bypass surgery in his early 56s. Father has coronary artery disease.  SOCIAL HISTORY: He has never smoked.  He is married.  He works as a Fish farm manager. He does not drink alcohol.  REVIEW OF SYSTEMS: He has been overweight.  Some indigestion.  No syncope.  No urinary complaints.  No blood in the stool or black stools.  Remainder of systems negative.  PHYSICAL EXAMINATION:  VITAL SIGNS: Blood pressure 172/82, pulse 80, respirations 32, temperature 98.6 degrees.  Oxygen saturation 97%.  Recheck of vital signs showed a blood pressure of 134/70, respirations 18.  GENERAL: He is in no acute distress.  He wears a wristwatch on the left wrist with a tight bracelet.  HEENT: Mucosa moist.  NECK: Supple, no thyromegaly or bruits.  CHEST: Lungs clear to auscultation and percussion.  Decreased breath  sounds. Chest wall nontender.  HEART: No distant tones.  S1 and S2.  ABDOMEN: Obese.  Soft, nontender.  No organomegaly or masses felt.  EXTREMITIES: Lower extremities show trace edema.  Calves nontender.  SKIN: Aging changes noted.  NEUROLOGIC: He is alert and cooperative.  Cranial nerves nonfocal.  LABORATORY DATA: EKG was normal.  Chest x-ray showed no infiltrates; cardiomegaly.  Potassium 3.6.  Hemoglobin 13.2.  CK 49.  Troponin 0.01.  ASSESSMENT/PLAN:  1. Chest pain, cardiac versus gastrointestinal related.  Obtain troponin x 3,     CPK x 3, and obtain EKG in the morning.  Obtain cardiology consultation to     help with further work-up.  Will continue with aspirin.  I will not start     him on heparin at this point.  2. Elevated blood pressure, probable hypertension.  Continue with Altace at     current dosage.  3. Diabetes mellitus, likely diet controlled at present.  Obtain hemoglobin     A1C and check CBG b.i.d.  Dietary consultation.  4. Obesity.  Dietary consultation.  5. Possible obstructive sleep apnea.  Will probably need to address this as     an outpatient.  6. 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  Will     monitor. DD:  08/19/00  TD:  08/19/00 Job: [id number]

## 2011-03-24 NOTE — H&P (Signed)
NAME:  Logan Valdez, Logan Valdez                      ACCOUNT NO.:  [id number]   MEDICAL RECORD NO.:  [id number]                   PATIENT TYPE:  INP   LOCATION:  3730                                 FACILITY:  [facility]   PHYSICIAN:  Titus Dubin. Alwyn Ren, M.D. [facility]         DATE OF BIRTH:  Aug 02, 1946   DATE OF ADMISSION:  11/12/2002  DATE OF DISCHARGE:                                HISTORY & PHYSICAL   HISTORY:  The patient is a 65 year old white male admitted with shortness of  breath in the context of left lower extremity edema with a questionable  Homan's sign with an asthmatic component.   He was seen on 11/03/2002 following an onset of cough on 12/28. This was  associated with headache and chest congestion. He had sweats and chills the  evening of 12/28, but the temperature was up to 101 or greater. The headache  was described as throbbing in the temples and the frontal sinuses. It was  aggravated by the nonproductive cough. He was having some paroxysmal  nocturnal dyspnea due to the cough. Tessalon Perles was of no benefit.  Temperature was 101. The oropharynx revealed mild erythema. The tympanic  membranes were dull. Narrow walls at the bases, but no increased work of  breathing. An S4 gallop was noted and he was placed on Tequin for 7 days and  given Anaplex-HD and was told to use Tylenol or Advil every four hours for  fever. Tequin was employed as he had been on amoxicillin in October 2003 for  bronchitis and possible strep pharyngitis.   He finished the Tequin on 1/4 and actually returned to work on 1/5. On the  evening of 1/6 he noticed swelling of the left lower extremity and a pain.  His wife felt that he had broken a blood vessel as she noted and  erythematous lesion on the left posterior calf. This morning approximately 6  a.m. he had acute shortness of breath upon awakening. He also had a  paroxysmal nonproductive cough this morning associated with heaviness in his  chest. When  he walked outside he had symptoms of exercise induced  bronchospasm following exposure to the cold.   He came to the office and was found to have oxygen saturations in the mid  80s with ambulation. He was found to have an equivocal Homan's sign on the  left and diffuse wheezing. He was admitted to rule out pulmonary and  thromboemboli and deep venous thrombosis.   PAST MEDICAL HISTORY:  His past medical history reveals a catheterization in  October 2001. He had less than a 30% lesion in the left main. He had a  repair of the right wrist over three decades ago after trauma. He had a  colonoscopy in 2000.   Medical problems include hyperlipidemia, weight excess, sleep apnea, and  gout.   FAMILY HISTORY:  Positive for cancer in his maternal aunt of unknown  primary, diabetes, hypertension, bypass surgery,  and myocardial infarction  in his brother, and emphysema in his father.   SOCIAL HISTORY:  He has never smoked. He drinks occasionally.   ALLERGIES:  He has no known drug allergies. He is questionably allergic to  indomethacin as he experienced near syncope on one occasion when he took  this.   MEDICATIONS:  He has been on Bayer aspirin 325 mg daily and took this the  morning of his office visit. He is also on Toprol XL 100 one-half daily.   REVIEW OF SYMPTOMS:  Review of systems reveals some heartburn recently. He  has had no angina, but the vague heaviness. The remainder of the review of  systems was checked and was negative. He questioned whether the pain in the  calf was related to gout.   He had lost six pounds over the holidays and related it to better dietary  discretion.   PHYSICAL EXAMINATION:   GENERAL:  He appeared deconditioned and mildly acutely ill.   VITAL SIGNS:  Weight 276, temperature 97.3, pulse 88, respiratory rate 24,  blood pressure 142/82.   HEENT:  There was some edema of the lids without frank angioedema. Arterial  narrowing is present.  Otololaryngologic exam was essentially negative.   NECK:  Carotid was normal to palpation.   LUNGS:  He had diffuse wheezing in all lung fields and an S4 gallop was  noted.   EXTREMITIES:  Trace to one-half plus edema was noted. A positive Homan's  sign was present in the left lower extremity. There was mild erythema over  the left superior calf, which appeared to be vascular. It did blanch with  pressure. There was full range of motion with no definite musculoskeletal  deficit.   ABDOMEN:  He also had somewhat of a livedo pattern over his abdomen. The  abdomen was massive, but nontender. He had no lymphadenopathy or  organomegaly.   GENITAL/RECTAL:  Deferred.   NEUROLOGIC:  He had essentially normal neurologic/psychiatric exam with no  deficits.   PLAN:  He is now admitted with shortness of breath in the context of left  lower extremity edema and questionable Homan's sign suggestive of possible  deep venous thrombosis or pulmonary emboli. The differential would include  asthmatic bronchitis following his bronchitic episode, which has been  treated with Tequin.   He will be admitted with a spiral CT and heparin coverage pending these  results. He will receive aggressive pulmonary toilet.                                                 Titus Dubin. Alwyn Ren, M.D. [facility]    [facility]/MEDQ  D:  11/12/2002  T:  11/12/2002  Job:  [id number]

## 2011-03-24 NOTE — Discharge Summary (Signed)
NAME:  Logan Valdez, Logan Valdez                      ACCOUNT NO.:  [id number]   MEDICAL RECORD NO.:  [id number]                   PATIENT TYPE:  INP   LOCATION:  3730                                 FACILITY:  [facility]   PHYSICIAN:  Rene Paci, M.D. [facility]          DATE OF BIRTH:  Dec 25, 1945   DATE OF ADMISSION:  11/12/2002  DATE OF DISCHARGE:  11/19/2002                                 DISCHARGE SUMMARY   DISCHARGE DIAGNOSES:  1. Bilateral pulmonary embolus.  2. Left lower extremity deep vein thrombosis.  3. Lupus anticoagulant positive.  4. Thrombocytopenia.  5. Gastritis.   BRIEF ADMISSION HISTORY:  The patient is a 65 year old white male who  presents with shortness of breath and left lower extremity edema.  The  patient had been seen in the office on November 03, 2002 with the onset of  cough.  The patient was treated with Tessalon Perles without improvement.  He was also given Anaplex HD, as well as Tylenol.  The patient was then  started on Tequin.  He completed his Tequin on November 09, 2002.  He returned  to work on November 10, 2002.  He then noted swelling in his left leg on  November 11, 2002.  He woke at 6 a.m. on the morning of admission acutely  short of breath.   PAST MEDICAL HISTORY:  1. Single-vessel coronary artery disease by catheterization in October 2001     with a 30% lesion in the left main.  2. History of colonoscopy in 2000.  3. Hyperlipidemia.  4. Obesity.  5. Sleep apnea.  6. Gout.   HOSPITAL COURSE:  Problem 1:  BILATERAL PULMONARY EMBOLUS AND LEFT LOWER  EXTREMITY DEEP VEIN THROMBOSIS:  The patient presented with acute shortness  of breath and left lower extremity swelling suspicious for a pulmonary  embolus.  He did have a spiral CT of the chest.  This confirmed bilateral  pulmonary embolus and a left lower extremity DVT.  The patient was started  on heparin and Coumadin.  The patient was also noted to be thrombocytopenic.  This prompted a  hematology/oncology evaluation.  The patient was evaluated  by Dr. Myna Hidalgo.  He made recommendations to rule out anticardiolipin  antibody syndrome, a hereditary hypercoagulable state, malignancies.  In  regards to his thrombocytopenia, he thought he might have a possible bone  marrow production problem and may need a bone marrow biopsy at some point.  The patient's lupus anticoagulant came back positive.  His workup was  otherwise unremarkable.  Dr. Myna Hidalgo felt the patient would require lifelong  Coumadin therapy.  He also made recommendations for the patient's INR to be  between 3-3.5 for the next year.  The patient has received seven days of  heparin at this time.  He is anticoagulated, although he is not quite at his  goal of 3-3.5 yet.   Problem 2:  GASTROINTESTINAL:  The patient has had a nonproductive cough  and  heartburn and indigestion.  This prompted a GI evaluation.  The patient  underwent endoscopy on November 17, 2002.  This revealed gastritis.  They  recommended a proton pump inhibitor b.i.d. for the time being.   Problem 3:  ADULT ONSET DIABETES MELLITUS:  This is a new diagnosis and  stable.   DISCHARGE LABORATORY DATA:  Pro time was 24.6, INR was 2.6.  CBC was normal  and his platelet count was 192, up from 53,000.  Stool for blood was  negative.  D-dimer was greater than 20.  Antithrombin III was 75.  Antiphospholipid evaluation and PTT LA was high at 200, PTT LA confirmation  was high at 23.6, DRVTT was elevated at 68.7, DRVTT confirmation was  elevated at 1.5.  Lupus anticoagulant was detected.  Protein C was 84,  protein S was elevated at 160.  Hemoglobin A1c was 6.8%.  LFT's were normal.  Renal function was normal.  Total cholesterol was elevated at 207,  triglycerides 132, HDL 51, LDL 130.   DISCHARGE MEDICATIONS:  1. Coumadin 3 mg 2 tablets until further instructed.  2. Toprol XL 1/2 tablet daily.  3. Actos 15 mg daily.  4. Amaryl 2 mg daily.  5. Protonix 40  mg b.i.d.  6. Advair 250/50 one puff b.i.d.  7. Singulair 10 mg daily.  8. The patient has been instructed not to take aspirin.   FOLLOW UP:  Follow up with Dr. Alwyn Ren on Wednesday, November 26, 2002 at 11  a.m. and Dr. Frederik Pear office on Friday, November 21, 2002 for a pro time.     Cornell Barman, P.A. [facility]                  Rene Paci, M.D. [facility]    LC/MEDQ  D:  11/19/2002  T:  11/19/2002  Job:  [id number]   cc:   Rose Phi. Myna Hidalgo, M.D.  [address] [facility]  [place], [place] [address]  Fax: [phone]   Titus Dubin. Alwyn Ren, M.D. [facility]   Lina Sar, M.D. [facility]

## 2011-03-24 NOTE — Discharge Summary (Signed)
[place]. [facility]  Patient:    Logan Valdez, MENEES                   MRN: [id number] Adm. Date:  [id number] Disc. Date: 08/20/00 Attending:  Tresa Garter CC:         Corwin Levins, M.D. [facility]   Discharge Summary  ADMISSION DIAGNOSES: 1. Chest pain, cardiac versus esophageal reflux. 2. Hypertension. 3. Hyperglycemia. 4. Weight excess. 5. Possible obstructive sleep apnea. 6. Neuropathy.  DISCHARGE DIAGNOSES: 1. Chest pain, cardiac versus esophageal reflux. 2. Hypertension. 3. Hyperglycemia. 4. Weight excess. 5. Possible obstructive sleep apnea. 6. Neuropathy.  OPERATIONS:  Coronary artery catheterization on August 20, 2000, revealing 20 to 30% proximal stenosis of the left main and an ejection fraction of 55%.  COMPLICATIONS:  None.  BRIEF HISTORY:  [name] is a 65 year old white male who began to experience achable chest pain associated with diaphoresis.  Additionally, he had numbness and tingling in the location of the left ulnar nerve for one week.  Significantly, a brother has had bypass grafting at age 49, his father had coronary artery disease.  He has never smoked.  He has had a weight problem, and is being evaluated for possible sleep apnea.  He also has had hypertension.  LABORATORY DATA:  Admitting EKG was normal.  Chest x-ray revealed no active disease, but there was some suggestion of possible cardiomegaly.  Potassium was low normal at 3.6.  Hemoglobin was 13.2.  Serial CPKs and MBs were within normal limits.  Glucose was 160; hemoglobin A1C was 6.6%.  Lipid profile revealed a total cholesterol of 198, triglycerides of 62, HDL of 45, and LDL of 141.  TSH was therapeutic at 2.43.  He continued to have the ulnar radiation of pain suggesting ulnar neuropathy. His chest pain resolved.  He underwent catheterization because of the strong family history and his multiple risk factors.  The findings were noted as  above.  He was to be discharged on Protonix 40 mg q.a.m., Altace 10 mg daily, enteric-coated aspirin 325 mg once daily with a meal.  He does have a sleep apnea evaluation pending, and this should be completed.  At present, his risks were identified and discussed with him in detail.  A weight over 200 would be a risk, as would an LDL over 130, and glucose over 115.  Additionally, lack of exercise 3 to 4 times a week for 30 to 45 minutes constitutes reversible risk factor.  He does not have uncontrolled hypertension at this time, as his blood pressure is 129/74 on Altace 10 mg daily.  He also has never smoked.  He will be discharged on nutrition consultation with 1800 to 2200 calorie ADA diet, low cholesterol.  Weight loss to 200 was recommended.  He will be given a reference to Dr. Cathi Roan, Drink, and Be Healthy.  Graduated cardiovascular exercise would be recommended.  PROGNOSIS:  Good if he addressed these issues.  Progressive low back pain, he may wish to use a treadmill, stationary bike, or water aerobics as a form of exercise.  He will be asked to see Dr. Melvyn Novas in two to three weeks for follow up.  ACTIVITIES:  Ad lib.  DIET:  As noted. DD:  08/20/00 TD:  08/20/00 Job: [id number]

## 2011-03-24 NOTE — Consult Note (Signed)
NAME:  Logan Valdez, Logan Valdez                      ACCOUNT NO.:  [id number]   MEDICAL RECORD NO.:  [id number]                   PATIENT TYPE:  INP   LOCATION:  3730                                 FACILITY:   PHYSICIAN:  Rose Phi. Myna Hidalgo, M.D.              DATE OF BIRTH:  [birth year]   DATE OF CONSULTATION:  11/13/2002  DATE OF DISCHARGE:                                   CONSULTATION   REASON FOR CONSULTATION:  1. Pulmonary embolism/deep venous thrombosis.  2. Thrombocytopenia.   HISTORY OF PRESENT ILLNESS:  Logan Valdez is a 65 year old white gentleman  who is very nice.  He was with his wife when I spoke with him this morning.  He has a history of sleep apnea, morbid obesity, elevated lipids.  He did  have a cardiac catheterization two years ago which showed nonobstructive  coronary artery disease.  He has been sick for several weeks.  He keeps  getting recurrent lung infections.  He recently had completed I think  seven days of Tequin.  He noticed swelling in the left lower leg.  He  thought this was another gout attack.  He then woke up the morning of the  7th with diaphoresis.  He was coughing severely.  He was not coughing up  blood.  He was having a hard time catching his breath.  He went to see Dr. Alwyn Ren.  Dr. Alwyn Ren immediately sent him to the emergency  room.  He subsequently underwent a CT scan.  CT scan showed bilateral pulmonary  emboli.  He also had a left lower extremity DVT in the peroneal vein.  He  was started on heparin.  Interestingly enough, he had a CBC done with a white count of 7.3,  hemoglobin 14.7, platelet count 53,000.  A metabolic panel was normal.  He  had an elevated D-dimer greater than 20.  Troponin was mildly elevated.  His  PT and PTT were normal.  Again heparin was initiated.  He has had some routine hypercoagulable  studies done which are not back.  We were subsequently asked to evaluate  him.   PAST MEDICAL HISTORY:  1. Sleep apnea.  2.  Morbid obesity.  3. Hyperlipidemia.  4. Borderline hypertension and diabetes.   ALLERGIES:  INDOCIN.   MEDICATIONS:  1. Albuterol nebulizer q. four hours.  2. Aspirin 325 mg daily.  3. Decadron taper.  4. Toprol XL 50 mg daily.  5. Protonix 40 mg daily.   SOCIAL HISTORY:  He has no history of tobacco use.  There is no alcohol use.  He works in a Insurance claims handler as a Merchandiser, retail.  He has no obvious occupational  exposures.   FAMILY HISTORY:  Remarkable for coronary artery disease.  His brother died  of coronary artery disease.  Another brother has polymyositis.  There is  history of COPD.   REVIEW OF SYSTEMS:  Remarkable for a six-pound weight loss.  He  has had a  fever and sweats. There has been no chills.  He has had chronic fatigue.  He  has not noted any diarrhea. There has been no obvious bleeding.  He has had  gouty attacks.   PHYSICAL EXAMINATION:  GENERAL:  This is an obese white gentleman in no  obvious distress.  VITAL SIGNS:  Temperature 99.1, pulse 84, respiratory rate 20, blood  pressure 150/100.  HEENT:  Normocephalic, atraumatic skull.  No ocular or oral lesions.  Pupils  react appropriately.  NECK:  No adenopathy in the neck.  LUNGS:  Decreased breath sounds diffusely.  There are a couple of expiratory  wheezes.  CARDIAC:  Regular rate and rhythm with a normal S1 and S2.  There are no  murmurs, rubs or bruits.  ABDOMEN:  Obese.  Abdomen is soft.  He has good  bowel sounds.  There is no obvious fluid wave.  There is no palpable  hepatosplenomegaly.  EXTREMITIES:  No pitting edema in the left lower extremity.  He does have  some slight tenderness to palpation of the left calf.  He has no gouty  changes in his joints.  SKIN:  Questionable petechial type lesions on his left lower extremity.  NEUROLOGIC:  No focal neurological deficits.   LABORATORY DATA:  His peripheral smear shows a normochromic, normocytic  population of red blood cells.  There may be some  slightly low rouleaux  formation.  No schistocytes are noted.  He has no nucleated red blood cells.  White cells appear with mildly increased hypersegmented polys.  There are no  immature mono____ or lymphoid forms.  I do not see any plasma cells.  Platelets are decreased in number.  Platelets are small in size.   IMPRESSION AND RECOMMENDATIONS:  Logan Valdez is a 65 year old gentleman  with deep venous thrombosis and pulmonary embolism.  He has no obvious risk  factor.  He does have sleep apnea.  He has been somewhat sedentary over the  past couple of weeks because of recurrent chest colds.  The fact that he had thrombocytopenia is quite interesting.  I would really  have to focus on him possibly having anticardiolipin antibody syndrome.  We  will send off the appropriate tests for this.   I think that it would be unusual for him to have a hereditary  hypercoagulable state. We will send off the appropriate studies to rule out  a hereditary hypocoagulable condition.  I do not think he has underlying malignancy.  This, however, strongly could  be looked at.  A CT scan of the abdomen and pelvis would be indicated.  Note, he does have heme negative stools.   The platelets being small would indicate a possible bone marrow production  problem.  I do not see any obvious medications that he is on that would lead  to marrow dysfunction.  As such, we may have to embark on a bone marrow  biopsy on him if our blood tests are negative for the etiology of his  emboli/thrombosis.   He does not need aspirin.  He is going to be on therapeutic anticoagulation  for at least a year and probably longer.  As such, I would discontinue his  aspirin.  As far as anticoagulation, I would go ahead and get him on Coumadin.  We can  certainly let the pharmacy manage his anticoagulation while he is in the  hospital.  I had a nice talk with Logan Valdez and his wife.  They are very nice  folks.  I told him my  thoughts.  He understands the possibilities with  regards to making a diagnosis.  We will plan to await results of our blood tests.  Results of our blood  tests will not affect his management.  Once his prothrombin time is about  with an INR of 3, I think he could be discharged.   Thank you so much for allowing [place] to see Logan Valdez.  We will follow him  closely.                                                  Rose Phi. Myna Hidalgo, M.D.    PRE/MEDQ  D:  11/13/2002  T:  11/13/2002  Job:  [id number]   cc:   Rene Paci, M.D. [facility]  [address]  [place], [place] [address]  Fax: 1   Titus Dubin. Alwyn Ren, M.D. [facility]

## 2011-04-06 ENCOUNTER — Other Ambulatory Visit: Payer: Self-pay | Admitting: Internal Medicine

## 2011-04-06 MED ORDER — WARFARIN SODIUM 5 MG PO TABS: 5.0000 mg | ORAL_TABLET | Freq: Every day | ORAL | Status: DC

## 2011-04-06 NOTE — Telephone Encounter (Signed)
RX sent to pharmacy  

## 2011-04-21 ENCOUNTER — Other Ambulatory Visit: Payer: Self-pay | Admitting: Internal Medicine

## 2011-04-21 ENCOUNTER — Other Ambulatory Visit: Payer: Medicare Other

## 2011-04-21 DIAGNOSIS — E119 Type 2 diabetes mellitus without complications: Secondary | ICD-10-CM

## 2011-04-21 DIAGNOSIS — Z86718 Personal history of other venous thrombosis and embolism: Secondary | ICD-10-CM

## 2011-04-21 DIAGNOSIS — T887XXA Unspecified adverse effect of drug or medicament, initial encounter: Secondary | ICD-10-CM

## 2011-04-21 DIAGNOSIS — E782 Mixed hyperlipidemia: Secondary | ICD-10-CM

## 2011-04-24 ENCOUNTER — Other Ambulatory Visit (INDEPENDENT_AMBULATORY_CARE_PROVIDER_SITE_OTHER): Payer: Medicare Other

## 2011-04-24 ENCOUNTER — Other Ambulatory Visit: Payer: Self-pay | Admitting: Internal Medicine

## 2011-04-24 DIAGNOSIS — Z8672 Personal history of thrombophlebitis: Secondary | ICD-10-CM

## 2011-04-24 DIAGNOSIS — T887XXA Unspecified adverse effect of drug or medicament, initial encounter: Secondary | ICD-10-CM

## 2011-04-24 DIAGNOSIS — E119 Type 2 diabetes mellitus without complications: Secondary | ICD-10-CM

## 2011-04-24 DIAGNOSIS — Z86718 Personal history of other venous thrombosis and embolism: Secondary | ICD-10-CM

## 2011-04-24 DIAGNOSIS — E782 Mixed hyperlipidemia: Secondary | ICD-10-CM

## 2011-04-24 LAB — MICROALBUMIN / CREATININE URINE RATIO
Creatinine,U: 98.8 mg/dL
Microalb Creat Ratio: 0.4 mg/g (ref 0.0–30.0)
Microalb, Ur: 0.4 mg/dL (ref 0.0–1.9)

## 2011-04-24 LAB — HEPATIC FUNCTION PANEL
ALT: 27 U/L (ref 0–53)
AST: 23 U/L (ref 0–37)
Albumin: 4 g/dL (ref 3.5–5.2)
Alkaline Phosphatase: 50 U/L (ref 39–117)
Total Protein: 7 g/dL (ref 6.0–8.3)

## 2011-04-24 LAB — LIPID PANEL: VLDL: 22 mg/dL (ref 0.0–40.0)

## 2011-04-24 LAB — LDL CHOLESTEROL, DIRECT: Direct LDL: 150.1 mg/dL

## 2011-04-26 ENCOUNTER — Telehealth: Payer: Self-pay

## 2011-04-26 DIAGNOSIS — Z86711 Personal history of pulmonary embolism: Secondary | ICD-10-CM

## 2011-04-26 NOTE — Telephone Encounter (Signed)
Placed a standing order for PT/INR checks

## 2011-05-05 ENCOUNTER — Other Ambulatory Visit: Payer: Self-pay | Admitting: Internal Medicine

## 2011-05-05 MED ORDER — WARFARIN SODIUM 5 MG PO TABS: 5.0000 mg | ORAL_TABLET | Freq: Every day | ORAL | Status: DC

## 2011-05-05 NOTE — Telephone Encounter (Signed)
Rx faxed.    KP 

## 2011-05-15 ENCOUNTER — Encounter: Payer: Self-pay | Admitting: Internal Medicine

## 2011-05-19 ENCOUNTER — Ambulatory Visit (INDEPENDENT_AMBULATORY_CARE_PROVIDER_SITE_OTHER): Payer: Medicare Other | Admitting: Internal Medicine

## 2011-05-19 ENCOUNTER — Encounter: Payer: Self-pay | Admitting: Internal Medicine

## 2011-05-19 DIAGNOSIS — Z79899 Other long term (current) drug therapy: Secondary | ICD-10-CM

## 2011-05-19 DIAGNOSIS — M109 Gout, unspecified: Secondary | ICD-10-CM

## 2011-05-19 DIAGNOSIS — E119 Type 2 diabetes mellitus without complications: Secondary | ICD-10-CM

## 2011-05-19 DIAGNOSIS — D6859 Other primary thrombophilia: Secondary | ICD-10-CM

## 2011-05-19 DIAGNOSIS — IMO0002 Reserved for concepts with insufficient information to code with codable children: Secondary | ICD-10-CM

## 2011-05-19 DIAGNOSIS — Z7901 Long term (current) use of anticoagulants: Secondary | ICD-10-CM

## 2011-05-19 DIAGNOSIS — Z8672 Personal history of thrombophlebitis: Secondary | ICD-10-CM

## 2011-05-19 NOTE — Assessment & Plan Note (Signed)
He states he's had no gout symptoms since his stroke

## 2011-05-19 NOTE — Progress Notes (Signed)
Subjective:    Patient ID: Logan Valdez, male    DOB: 1946-02-07, 65 y.o.   MRN: [id number]  HPI#1  Diabetes status assessment: Fasting or morning glucose range/ average:  Not checked  Highest glucose 2 hours after any meal:  Not checked. Hypoglycemia :  no .                                                     Excess thirst ;   hunger ;   urination:  no.                                  Lightheadedness with standing:  no. Chest pain:  no ; Palpitations :no ;  Pain in  calves with walking:  no .                                                                                                                                 Non healing skin  ulcers or sores,especially over the feet:  no. Numbness or tingling or burning in feet : no .                                                                                                                                              Significant change in  Weight : up 13#  Vision changes : no  .                                                                    Exercise : decreased due to back/ hip pain . Nutrition/diet:  No plan; increased "greens " lately. Medication compliance : yes. Medication adverse  Effects:  no . Eye exam : this year; no retinopathy. Foot care : no.  A1c/ urine microalbumin monitor:  6.7% ( average sugar    146; risk 34%)  #  2 anticoagulation: PT/INR is 2.9. This is in the context of increase intake of greens seasonally. Previously his PT/INR had remained essentially at 2.4 since March of this year when it was  3.4. He denies any use significant bruising or abnormal bleeding.    Review of Systems his wife states that his sleep apnea that is stable.  Although his back pain he took some exercises, is usually controlled with half a pain pill from Dr. Ethelene Hal. They've question possible surgical intervention for his back. He has no bowel or urinary incontinence.    Objective:   Physical Exam Gen.: well-nourished in  appearance. Alert, appropriate and cooperative throughout exam. Weight excess Eyes: No corneal or conjunctival inflammation noted.  Neck: No deformities, masses, or tenderness noted. Range of motion & . Thyroid normal. Lungs: Normal respiratory effort; chest expands symmetrically. Lungs are clear to auscultation without rales, wheezes, or increased work of breathing. Heart: Normal rate and rhythm. Normal S1 and S2. No gallop, click, or rub. Grade 1 systolic murmur  Abdomen: Bowel sounds normal; abdomen soft and nontender. No masses, organomegaly. Ventral hernia noted.    .                                                                                Musculoskeletal/extremities: No deformity or scoliosis noted of  the thoracic or lumbar spine. No clubbing, cyanosis, edema. Minor OA DIP changes. .He has a great deal of difficulty lying on the exam table and must  "crawl" back up. There is negative straight leg raising bilaterally. .Tone & strength  normal.Joints normal. Nail health  good. Vascular: Carotid, radial artery,  and  posterior tibial pulses are full and equal. No bruits present. Decreased DPP Neurologic:  Deep tendon reflexes symmetrical and normal.  Light touch normal over feet.        Skin: Intact without suspicious lesions or rashes. Lymph: No cervical, axillary  lymphadenopathy present. Psych: Mood and affect are normal. Normally interactive                                                                                         Assessment & Plan:  #1 diabetes, adequate control. Of concern is the recent weight gain  #2 anticoagulation, PT/INR is upper limits of normal. This is most likely related to dietary changes  #3 lumbar radiculopathy, clinically this appears to be stable. Risk of surgery appears to outweigh the benefits  Plan: #1 recheck the A1c in 4 months. Institute a low-carb nutrition program  #2 no change in his Coumadin as long as he  does  not increase his  intake of  Greens  more than at present  #3 he'll discuss the risk benefit ratio for back surgery with Dr.Ramos. Because of his history of patent foramen ovale and coagulopathy   peri operative Lovenox would be  necessary  would be necessary.

## 2011-05-19 NOTE — Patient Instructions (Signed)
.  Follow the low carb nutrition program in [employer] as closely as possible to prevent Diabetes progression & complications.   You should consume less than 40 grams  of sugar per day from foods and drinks with high fructose corn syrup as number1, 2, 3, or #4 on the label . This will also help prevent gout.  Please recheck your A1c in 4 months with fasting lipids, BUN , creat, AST, ALT

## 2011-05-31 ENCOUNTER — Other Ambulatory Visit: Payer: Self-pay | Admitting: Internal Medicine

## 2011-05-31 MED ORDER — METOPROLOL SUCCINATE ER 100 MG PO TB24: 100.0000 mg | ORAL_TABLET | Freq: Every day | ORAL | Status: DC

## 2011-05-31 NOTE — Telephone Encounter (Signed)
done

## 2011-06-01 ENCOUNTER — Other Ambulatory Visit: Payer: Self-pay | Admitting: Internal Medicine

## 2011-06-01 MED ORDER — WARFARIN SODIUM 5 MG PO TABS: 5.0000 mg | ORAL_TABLET | Freq: Every day | ORAL | Status: DC

## 2011-06-01 NOTE — Telephone Encounter (Signed)
Refill  X 6 months 

## 2011-06-01 NOTE — Telephone Encounter (Signed)
This is Dr.Hop's patient. Please advise    KP

## 2011-06-01 NOTE — Telephone Encounter (Signed)
RX sent to pharmacy  

## 2011-06-19 ENCOUNTER — Other Ambulatory Visit: Payer: Medicare Other

## 2011-06-20 ENCOUNTER — Other Ambulatory Visit (INDEPENDENT_AMBULATORY_CARE_PROVIDER_SITE_OTHER): Payer: Medicare Other

## 2011-06-20 DIAGNOSIS — Z86711 Personal history of pulmonary embolism: Secondary | ICD-10-CM

## 2011-06-21 ENCOUNTER — Telehealth: Payer: Self-pay

## 2011-06-21 NOTE — Telephone Encounter (Signed)
Message copied by Edgardo Roys on Wed Jun 21, 2011  5:03 PM ------      Message from: Pecola Lawless      Created: Tue Jun 20, 2011  5:42 PM       PT/INR is therapeutic. No change in warfarin dose; repeat PT/INR in 3 weeks as the value  is low therapeutic

## 2011-06-21 NOTE — Telephone Encounter (Signed)
Spoke with patient's wife, instruction ok'd: no change and recheck in 3 weeks

## 2011-07-20 ENCOUNTER — Other Ambulatory Visit (INDEPENDENT_AMBULATORY_CARE_PROVIDER_SITE_OTHER): Payer: Medicare Other

## 2011-07-20 DIAGNOSIS — Z86711 Personal history of pulmonary embolism: Secondary | ICD-10-CM

## 2011-07-20 LAB — PROTIME-INR: Prothrombin Time: 27.7 s — ABNORMAL HIGH (ref 10.2–12.4)

## 2011-07-21 ENCOUNTER — Telehealth: Payer: Self-pay

## 2011-07-21 NOTE — Telephone Encounter (Signed)
Spoke with patient's wife, all instruction ok'd. PT/INR 2.7

## 2011-07-21 NOTE — Telephone Encounter (Signed)
Message copied by Edgardo Roys on Fri Jul 21, 2011  4:45 PM ------      Message from: Pecola Lawless      Created: Fri Jul 21, 2011  6:00 AM       No change; 1 month      ----- Message -----         From: [facility] [place] Interface         Sent: 07/20/2011   3:34 PM           To: Pecola Lawless, MD

## 2011-08-07 LAB — URINALYSIS, ROUTINE W REFLEX MICROSCOPIC
Glucose, UA: NEGATIVE
Specific Gravity, Urine: 1.015
pH: 6.5

## 2011-08-07 LAB — LIPID PANEL
HDL: 36 — ABNORMAL LOW
LDL Cholesterol: 140 — ABNORMAL HIGH
Total CHOL/HDL Ratio: 5.7
Triglycerides: 138
VLDL: 28

## 2011-08-07 LAB — HEMOGLOBIN A1C
Hgb A1c MFr Bld: 6.1
Mean Plasma Glucose: 128

## 2011-08-07 LAB — CBC
MCV: 87.4
RBC: 4.92
WBC: 7.2

## 2011-08-07 LAB — POCT I-STAT, CHEM 8
BUN: 16
Calcium, Ion: 1.07 — ABNORMAL LOW
Chloride: 106
Sodium: 139

## 2011-08-07 LAB — HOMOCYSTEINE: Homocysteine: 10.7

## 2011-08-07 LAB — CK TOTAL AND CKMB (NOT AT ARMC): Relative Index: INVALID

## 2011-08-07 LAB — COMPREHENSIVE METABOLIC PANEL
ALT: 21
AST: 20
Alkaline Phosphatase: 57
CO2: 24
Calcium: 9.1
GFR calc Af Amer: >60
Sodium: 140
Total Bilirubin: 1
Total Protein: 6.9

## 2011-08-07 LAB — DIFFERENTIAL
Eosinophils Absolute: 0.1
Lymphs Abs: 1.2
Monocytes Relative: 9
Neutrophils Relative %: 73

## 2011-08-07 LAB — APTT: aPTT: 42 — ABNORMAL HIGH

## 2011-08-07 LAB — GLUCOSE, CAPILLARY
Glucose-Capillary: 110 — ABNORMAL HIGH
Glucose-Capillary: 114 — ABNORMAL HIGH
Glucose-Capillary: 119 — ABNORMAL HIGH

## 2011-08-07 LAB — PROTIME-INR
INR: 2.3 — ABNORMAL HIGH
INR: 2.4 — ABNORMAL HIGH
Prothrombin Time: 27.6 — ABNORMAL HIGH

## 2011-08-10 LAB — URINALYSIS, ROUTINE W REFLEX MICROSCOPIC
Bilirubin Urine: NEGATIVE
Glucose, UA: NEGATIVE mg/dL
Hgb urine dipstick: NEGATIVE
Ketones, ur: 15 mg/dL — AB
pH: 5.5 (ref 5.0–8.0)

## 2011-08-11 ENCOUNTER — Encounter: Payer: Self-pay | Admitting: Internal Medicine

## 2011-08-11 ENCOUNTER — Ambulatory Visit (INDEPENDENT_AMBULATORY_CARE_PROVIDER_SITE_OTHER)
Admission: RE | Admit: 2011-08-11 | Discharge: 2011-08-11 | Disposition: A | Discharge status: Home / Self Care | Payer: Medicare Other | Source: Ambulatory Visit | Attending: Internal Medicine | Admitting: Internal Medicine

## 2011-08-11 ENCOUNTER — Ambulatory Visit (INDEPENDENT_AMBULATORY_CARE_PROVIDER_SITE_OTHER): Payer: Medicare Other | Admitting: Internal Medicine

## 2011-08-11 DIAGNOSIS — M25572 Pain in left ankle and joints of left foot: Secondary | ICD-10-CM

## 2011-08-11 DIAGNOSIS — M79609 Pain in unspecified limb: Secondary | ICD-10-CM

## 2011-08-11 DIAGNOSIS — M109 Gout, unspecified: Secondary | ICD-10-CM

## 2011-08-11 DIAGNOSIS — R609 Edema, unspecified: Secondary | ICD-10-CM

## 2011-08-11 DIAGNOSIS — M25579 Pain in unspecified ankle and joints of unspecified foot: Secondary | ICD-10-CM

## 2011-08-11 DIAGNOSIS — R509 Fever, unspecified: Secondary | ICD-10-CM

## 2011-08-11 LAB — CBC WITH DIFFERENTIAL/PLATELET
Basophils Relative: 0.3 % (ref 0.0–3.0)
Eosinophils Relative: 1.2 % (ref 0.0–5.0)
HCT: 44.3 % (ref 39.0–52.0)
Lymphs Abs: 2.2 10*3/uL (ref 0.7–4.0)
Monocytes Relative: 12.8 % — ABNORMAL HIGH (ref 3.0–12.0)
Platelets: 166 10*3/uL (ref 150.0–400.0)
RBC: 4.93 Mil/uL (ref 4.22–5.81)
WBC: 12.1 10*3/uL — ABNORMAL HIGH (ref 4.5–10.5)

## 2011-08-11 LAB — URIC ACID: Uric Acid, Serum: 7.1 mg/dL (ref 4.0–7.8)

## 2011-08-11 MED ORDER — COLCHICINE 0.6 MG PO TABS: 0.6000 mg | ORAL_TABLET | Freq: Every day | ORAL | Status: DC

## 2011-08-11 MED ORDER — PREDNISONE 20 MG PO TABS: 20.0000 mg | ORAL_TABLET | Freq: Two times a day (BID) | ORAL | Status: AC

## 2011-08-11 NOTE — Progress Notes (Signed)
  Subjective:    Patient ID: Logan Valdez, male    DOB: November 28, 1945, 65 y.o.   MRN: [id number]  HPI Extremity pain, left foot Location: left foot -  Top of foot and inner aspect primarily, outer aspect slightly tender Onset: Wednesday, 08/09/11 evening Trigger/injury: none, started suddenly Pain quality: constant throbbing pain Pain severity: 10/10 on pain scale Duration: constant Radiation: no radiation  Exacerbating factors:pain increases with flexion or extension of foot, walking Treatment/response: drank cherry juice with minimal relief Review of systems: Constitutional: Fever: yes 99.5 last night and 99.7 this morning, chills Yes, sweats No, change in weight No  Musculoskeletal:no  muscle cramps or pain; Yes  joint stiffness, slight redness, Yes swelling - ankle both inner and outer aspect but not top of foot  Skin:no rash Neuro: no weakness; incontinence (stool/urine); Yes numbness especially on top of foot; Yes tingling Heme:no lymphadenopathy; abnormal bruising or bleeding       Review of Systems     Objective:   Physical Exam he is in no acute distress, but uncomfortable  Pedal pulses are intact.  Nail health is good.  There is visible swelling of the left foot and ankle. There is no significant plethora or erythema. The ankle edema does not yet. He has pain with palpation of the ankle, Achilles tendon or dorsum of the foot. He describes pain with range of motion; this is decreased in all directions.        Assessment & Plan:  #1 foot pain with swelling and low-grade fever. No history of trauma; most likely etiology is exacerbation of his gout.  Plan: See orders

## 2011-08-11 NOTE — Patient Instructions (Addendum)
The most common cause of elevated URIC ACID  is the ingestion of sugar from high fructose corn syrup sources. You should consume less than 40 grams  of sugar per day from foods and drinks with high fructose corn syrup as number 1, 2, 3, or #4 on the label.   Order for x-rays entered into  the computer; these will be performed at [facility] across from [facility]. No appointment is necessary.

## 2011-08-14 ENCOUNTER — Telehealth: Payer: Self-pay

## 2011-08-14 NOTE — Telephone Encounter (Signed)
Inez notified no more meds if pain has resolved.  She states no more pain and patient was able to walk better on Friday after first doses.  Advised if it persists pt would need to see rheumatology.  She is agreeable.

## 2011-08-14 NOTE — Telephone Encounter (Signed)
Message left on Triage voicemail: patient's wife would like to know if he was to continue taking gout medication? Patient finished recent rx's

## 2011-08-14 NOTE — Telephone Encounter (Signed)
No additional medication needed if the pain in the foot has resolved. If it persists, I would like rheumatology to  evaluate him. The Coumadin and other multiple health problems will complicate treating gout

## 2011-08-16 ENCOUNTER — Telehealth: Payer: Self-pay

## 2011-08-16 NOTE — Telephone Encounter (Signed)
Spoke with patient and patient's wife, patient's foot is much better. Patient aware copy of report to be mailed

## 2011-08-16 NOTE — Telephone Encounter (Signed)
Message copied by Edgardo Roys on Wed Aug 16, 2011  2:36 PM ------      Message from: Pecola Lawless      Created: Sat Aug 12, 2011 12:11 PM       Acute gout without boney erosions suggested.  I recommend an Rheumatology  consultation to determine optimal therapy if symptoms persist or progress despite meds. [employer]

## 2011-08-24 LAB — I-STAT 8, (EC8 V) (CONVERTED LAB)
BUN: 12
Chloride: 106
Hemoglobin: 15
Operator id: 146091
Sodium: 138

## 2011-08-24 LAB — CARDIAC PANEL(CRET KIN+CKTOT+MB+TROPI)
CK, MB: 0.9
Relative Index: INVALID
Relative Index: INVALID
Troponin I: 0.01
Troponin I: 0.01

## 2011-08-24 LAB — CBC
HCT: 41.5
Hemoglobin: 13.3
MCHC: 33.5
MCV: 85.9
MCV: 86.4
RBC: 4.62
RBC: 4.81
WBC: 7.6

## 2011-08-24 LAB — DIFFERENTIAL
Eosinophils Absolute: 0.2
Eosinophils Relative: 2
Lymphs Abs: 1.6
Monocytes Relative: 8

## 2011-08-24 LAB — COMPREHENSIVE METABOLIC PANEL
ALT: 22
Alkaline Phosphatase: 51
CO2: 27
Calcium: 8.7
GFR calc non Af Amer: >60
Glucose, Bld: 82
Sodium: 137
Total Bilirubin: 0.9

## 2011-08-24 LAB — HEMOGLOBIN A1C: Hgb A1c MFr Bld: 6.1

## 2011-08-24 LAB — POCT I-STAT CREATININE
Creatinine, Ser: 0.9
Operator id: 146091

## 2011-08-24 LAB — POCT CARDIAC MARKERS: Operator id: 146091

## 2011-08-24 LAB — LIPID PANEL
Cholesterol: 155
Total CHOL/HDL Ratio: 3.7
VLDL: 22

## 2011-08-24 LAB — PROTIME-INR
INR: 2.2 — ABNORMAL HIGH
Prothrombin Time: 25.3 — ABNORMAL HIGH

## 2011-08-24 LAB — B-NATRIURETIC PEPTIDE (CONVERTED LAB): Pro B Natriuretic peptide (BNP): <30

## 2011-09-14 ENCOUNTER — Other Ambulatory Visit: Payer: Self-pay | Admitting: Internal Medicine

## 2011-09-14 DIAGNOSIS — T887XXA Unspecified adverse effect of drug or medicament, initial encounter: Secondary | ICD-10-CM

## 2011-09-14 DIAGNOSIS — E119 Type 2 diabetes mellitus without complications: Secondary | ICD-10-CM

## 2011-09-14 DIAGNOSIS — E785 Hyperlipidemia, unspecified: Secondary | ICD-10-CM

## 2011-09-19 ENCOUNTER — Other Ambulatory Visit: Payer: Medicare Other

## 2011-09-20 ENCOUNTER — Telehealth: Payer: Self-pay

## 2011-09-20 NOTE — Telephone Encounter (Signed)
Message left on voicemail: Patient's wife would like a returned call (no further info given)

## 2011-09-20 NOTE — Telephone Encounter (Signed)
Pt wife states that as Pt was walking on floor barefoot he got a sharp pain in his foot that felt like he had stepped on something. Pt wife was worried because she knows that he is diabetic. Pt wife denies any redness, swelling, bruising, bleeding or possible entry wound that would indicated that he stepped on something. Pt wife note that after a while pain move in foot and has now improved some. Pt wife advise to monitor foot and if symptoms persist or increase Pt will need to come in for OV. Pt wife ok will call if symptoms do not continue to improve.

## 2011-09-26 ENCOUNTER — Other Ambulatory Visit (INDEPENDENT_AMBULATORY_CARE_PROVIDER_SITE_OTHER): Payer: Medicare Other

## 2011-09-26 DIAGNOSIS — Z86711 Personal history of pulmonary embolism: Secondary | ICD-10-CM

## 2011-09-29 ENCOUNTER — Telehealth: Payer: Self-pay

## 2011-09-29 NOTE — Telephone Encounter (Signed)
Spoke with patient's wife, all instruction ok'd  

## 2011-09-29 NOTE — Telephone Encounter (Signed)
Message copied by Edgardo Roys on Fri Sep 29, 2011  5:03 PM ------      Message from: Pecola Lawless      Created: Wed Sep 27, 2011  5:25 PM       PT/INR is LOW  therapeutic. No change in warfarin dose; repeat PT/INR in 3 weeks

## 2011-10-27 ENCOUNTER — Telehealth: Payer: Self-pay | Admitting: Internal Medicine

## 2011-10-27 NOTE — Telephone Encounter (Signed)
Wife has been made aware and she said the Neurologist called but they missed their call, she agreed to call them on Monday    KP

## 2011-10-27 NOTE — Telephone Encounter (Signed)
I assume they're talking about the generic form of Namenda 10 mg. This should hold the medication and assess response of the rash. Please verify if the Namenda has been prescribed by the neurologist rather than by me.

## 2011-10-27 NOTE — Telephone Encounter (Signed)
Spoke with wife and she stated the Metanex (B6-B12) which the Neurologist prescribed (it was given to patient for a stroke 3 years ago). He was given the generic and it gave him a rash, the pharmacist said it may be from the dye in the the medication, she said he did not take it this morning and wanted to know if he should discontinue until they hear back from Neurologist.     KP

## 2011-10-27 NOTE — Telephone Encounter (Signed)
Patients wife states that patient is breaking out in a rash. She thinks its a side effect from metanex(sp?). The pharmacist gave patient generic form of metanex(lmethyl). Please assist.

## 2011-10-27 NOTE — Telephone Encounter (Signed)
Patient wife wanted to leave another phone # to contact her [phone]

## 2011-10-27 NOTE — Telephone Encounter (Signed)
Definitely stay off this until the neurologists assesses this situation

## 2011-11-09 ENCOUNTER — Ambulatory Visit: Payer: Medicare Other | Admitting: Family Medicine

## 2011-11-13 ENCOUNTER — Ambulatory Visit (INDEPENDENT_AMBULATORY_CARE_PROVIDER_SITE_OTHER): Payer: Medicare Other | Admitting: Internal Medicine

## 2011-11-13 ENCOUNTER — Encounter: Payer: Self-pay | Admitting: Internal Medicine

## 2011-11-13 DIAGNOSIS — J45909 Unspecified asthma, uncomplicated: Secondary | ICD-10-CM

## 2011-11-13 DIAGNOSIS — Z7901 Long term (current) use of anticoagulants: Secondary | ICD-10-CM

## 2011-11-13 DIAGNOSIS — R21 Rash and other nonspecific skin eruption: Secondary | ICD-10-CM

## 2011-11-13 DIAGNOSIS — Z86711 Personal history of pulmonary embolism: Secondary | ICD-10-CM

## 2011-11-13 MED ORDER — BECLOMETHASONE DIPROPIONATE 80 MCG/ACT IN AERS: 1.0000 | INHALATION_SPRAY | Freq: Two times a day (BID) | RESPIRATORY_TRACT | Status: DC

## 2011-11-13 MED ORDER — PREDNISONE 20 MG PO TABS: 20.0000 mg | ORAL_TABLET | Freq: Two times a day (BID) | ORAL | Status: DC

## 2011-11-13 MED ORDER — MOMETASONE FUROATE 0.1 % EX OINT: TOPICAL_OINTMENT | Freq: Two times a day (BID) | CUTANEOUS | Status: DC

## 2011-11-14 ENCOUNTER — Other Ambulatory Visit: Payer: Self-pay | Admitting: Internal Medicine

## 2011-11-14 MED ORDER — WARFARIN SODIUM 5 MG PO TABS: 5.0000 mg | ORAL_TABLET | Freq: Every day | ORAL | Status: DC

## 2011-11-14 NOTE — Telephone Encounter (Signed)
rx sent

## 2011-11-20 NOTE — Progress Notes (Signed)
  Subjective:    Patient ID: Logan Valdez, male    DOB: Nov 10, 1945, 66 y.o.   MRN: [id number]  HPI RASH: Location: arms, thorax & minimally on legs  Onset: 3 weeks ago as erythema over upper chest  Course: spreading diffusely Self-treated with:hydrocortisone topically & benadryl helped             History Pruritis: yes Tenderness: not significantly New medications/antibiotics:yes, MVI from Neurology was changed to generic & Crestor was changed to generic Simvastatin due to cost. His wife questions whether he had broken out with simvastatin in the past. Tick/insect/pet exposure: no Recent travel: no New detergent, new clothing, or other topical exposure:no  Red Flags Feeling ill:no  Fever: no Mouth lesions: no Facial/tongue swelling/difficulty breathing:  no Diabetic or immunocompromised:DM controlled   #2 COUGH Onset/symptoms:3+ weeks ago Exposures (illness/environmental/extrinsic):? Progression of symptoms:improving over past 2 days Treatments/response:Mucinex DM with some benefit Associated symptoms/signs:  URI symptoms: No facial pain, frontal headaches, nasal purulence, dental pain,sorethroat, or ear ache/otic discharge Extrinsic symptoms: No itchy eyes, sneezing, or angioedema Infectious symptoms: No fever, chills, sweats, and purulent secretions Chest symptoms: No pleuritic pain. Clear  sputum w/o  Hemoptysis. Some dyspnea with cough. Intermittent  wheezing GI symptoms: No dyspepsia, dysphagia, reflux symptoms Smoking history:never ACE inhibitor administration:no Past medical history/family history pulmonary disease: asthma as child         Review of Systems     Objective:   Physical Exam General appearance:well nourished; no acute distress or increased work of breathing is present.  No  lymphadenopathy about the head, neck, or axilla noted.   Eyes: No conjunctival inflammation or lid edema is present. No icterus  Ears:  External ear exam shows no  significant lesions or deformities.  Otoscopic examination reveals clear canals, tympanic membranes are intact bilaterally without bulging, retraction, inflammation or discharge.  Nose:  External nasal examination shows no deformity or inflammation. Nasal mucosa are pink and moist without lesions or exudates. No septal dislocation or deviation.No obstruction to airflow.   Oral exam: Dental hygiene is good; lips and gums are healthy appearing.There is no oropharyngeal erythema or exudate noted.     Heart:  Normal rate and regular rhythm. S1 and S2 normal without gallop, murmur, click, rub or other extra sounds.   Lungs:Chest clear to auscultation; no wheezes, rhonchi,rales ,or rubs present.No increased work of breathing.    Extremities:  No cyanosis, edema, or clubbing  noted    Skin: Warm & dry w/o jaundice . Diffuse, flat slightly erythematous lesions are noted. Those on the upper chest blanch with pressure. No dermatographia could be elicited           Assessment & Plan:   #1 rash, pruritic, possible drug reaction to statin or generic vitamin supplement  #2 asthmatic bronchitis without fever or purulent secretions.  Plan see orders and recommendations. Simvastatin will be discontinued well the rash is treated.

## 2011-11-22 NOTE — Patient Instructions (Signed)
Use the prescription steroid ointment twice a day. If  skin is dry; you can mix Eucerin 1 part to  1 part of the steroid ointment & apply twice a day. Stop the simvastatin until the rash has resolved for at least a week. Use Qvar sample 2 puffs every 12 hours. Gargle and spit after use.

## 2012-01-03 ENCOUNTER — Other Ambulatory Visit (INDEPENDENT_AMBULATORY_CARE_PROVIDER_SITE_OTHER): Payer: Medicare Other

## 2012-01-03 DIAGNOSIS — Z86711 Personal history of pulmonary embolism: Secondary | ICD-10-CM

## 2012-01-03 LAB — PROTIME-INR: INR: 2 ratio — ABNORMAL HIGH (ref 0.8–1.0)

## 2012-01-12 ENCOUNTER — Telehealth: Payer: Self-pay | Admitting: Internal Medicine

## 2012-01-12 MED ORDER — WARFARIN SODIUM 5 MG PO TABS: 5.0000 mg | ORAL_TABLET | Freq: Every day | ORAL | Status: DC

## 2012-01-12 NOTE — Telephone Encounter (Signed)
Prescription sent to pharmacy.

## 2012-01-12 NOTE — Telephone Encounter (Signed)
Refill: Warfarin Sodium 5mg  tab #45. Take 1 tablet by mouth daily except take 1 & 1/2 tablets on every Wednesday. Last fill 12-14-11

## 2012-02-07 ENCOUNTER — Encounter (INDEPENDENT_AMBULATORY_CARE_PROVIDER_SITE_OTHER): Payer: Medicare Other

## 2012-02-07 DIAGNOSIS — Z86711 Personal history of pulmonary embolism: Secondary | ICD-10-CM

## 2012-02-07 LAB — PROTIME-INR
INR: 1.9 ratio — ABNORMAL HIGH (ref 0.8–1.0)
Prothrombin Time: 20.5 s — ABNORMAL HIGH (ref 10.2–12.4)

## 2012-02-13 ENCOUNTER — Telehealth: Payer: Self-pay

## 2012-02-13 NOTE — Telephone Encounter (Signed)
Patient's wife called to inquire about PT/INR results. Patient currently taking 1 1/2 on T/W/Sat and 1 all other days.   Per Dr.Hopper patient to take 1 1/2 on M/W/F/Sun and 1 all other days and recheck in 3 weeks. Patient's wife verbalized understanding and repeated back

## 2012-02-14 NOTE — Progress Notes (Signed)
This encounter was created in error - please disregard.

## 2012-03-04 ENCOUNTER — Other Ambulatory Visit: Payer: Self-pay | Admitting: Internal Medicine

## 2012-03-04 MED ORDER — WARFARIN SODIUM 5 MG PO TABS: 5.0000 mg | ORAL_TABLET | Freq: Every day | ORAL | Status: DC

## 2012-03-04 NOTE — Telephone Encounter (Signed)
refill warfarin sodium 5MG  tab #45 Take one tablet by mouth daily except take 1.5 tablets on wednesdays as directed Qty 45 Last filed 4.4.13 Last OV 1.7.13

## 2012-03-04 NOTE — Telephone Encounter (Signed)
RX sent

## 2012-03-26 ENCOUNTER — Other Ambulatory Visit (INDEPENDENT_AMBULATORY_CARE_PROVIDER_SITE_OTHER): Payer: Medicare Other

## 2012-03-26 ENCOUNTER — Telehealth: Payer: Self-pay

## 2012-03-26 DIAGNOSIS — Z86711 Personal history of pulmonary embolism: Secondary | ICD-10-CM

## 2012-03-26 NOTE — Telephone Encounter (Signed)
Message copied by Maurice Small on Tue Mar 26, 2012  3:55 PM ------      Message from: Logan Valdez      Created: Tue Mar 26, 2012  1:55 PM       Decrease weekly dose by 2.5 mg one day of week, otherwise no change.Please recheck in 4 weeks

## 2012-03-26 NOTE — Telephone Encounter (Signed)
Left message on voicemail for patient to return call when available   

## 2012-03-28 NOTE — Telephone Encounter (Signed)
Patient currently taking: M/W/F/Sun 1 1/2 tab (5mg  tab), all other days 1 tab. Patient was instructed to flip flop to M/W/F/Sun 1 tab daily, T/Th/Sat 1 1/2 tab

## 2012-05-10 ENCOUNTER — Other Ambulatory Visit: Payer: Self-pay | Admitting: Internal Medicine

## 2012-05-10 MED ORDER — WARFARIN SODIUM 5 MG PO TABS: 5.0000 mg | ORAL_TABLET | Freq: Every day | ORAL | Status: DC

## 2012-05-10 NOTE — Telephone Encounter (Signed)
Refill done.  

## 2012-05-10 NOTE — Telephone Encounter (Signed)
refill warfarin sodium tab # 45, take one tablet by mouth daily except take 1.5 tablets on wednesdays, last fill 6.3.13, last ov 1.17.13

## 2012-05-15 ENCOUNTER — Other Ambulatory Visit: Payer: Self-pay | Admitting: Internal Medicine

## 2012-05-15 ENCOUNTER — Other Ambulatory Visit (INDEPENDENT_AMBULATORY_CARE_PROVIDER_SITE_OTHER): Payer: Medicare Other

## 2012-05-15 DIAGNOSIS — Z86718 Personal history of other venous thrombosis and embolism: Secondary | ICD-10-CM

## 2012-05-15 DIAGNOSIS — Z86711 Personal history of pulmonary embolism: Secondary | ICD-10-CM

## 2012-05-15 DIAGNOSIS — Z7901 Long term (current) use of anticoagulants: Secondary | ICD-10-CM

## 2012-05-15 LAB — PROTIME-INR
INR: 2.4 ratio — ABNORMAL HIGH (ref 0.8–1.0)
Prothrombin Time: 27 s — ABNORMAL HIGH (ref 10.2–12.4)

## 2012-05-16 ENCOUNTER — Telehealth: Payer: Self-pay

## 2012-05-16 NOTE — Telephone Encounter (Signed)
Spoke with patient's wife and she verbalized understanding of results.

## 2012-05-16 NOTE — Telephone Encounter (Signed)
Message copied by Maurice Small on Thu May 16, 2012 10:23 AM ------      Message from: Pecola Lawless      Created: Wed May 15, 2012  6:09 PM       PT/INR is therapeutic. No change in warfarin dose; repeat PT/INR in 4 weeks       ----- Message -----         From: [facility]         Sent: 05/15/2012   2:57 PM           To: Pecola Lawless, MD

## 2012-06-17 ENCOUNTER — Other Ambulatory Visit: Payer: Self-pay | Admitting: Internal Medicine

## 2012-06-17 MED ORDER — WARFARIN SODIUM 5 MG PO TABS: 5.0000 mg | ORAL_TABLET | Freq: Every day | ORAL | Status: DC

## 2012-06-17 NOTE — Telephone Encounter (Signed)
Refill Warfarin Sodium (Tab) 5 MG # 45Take 1 tablet (5 mg total) by mouth daily. Except 1 & 1/2 tablets on Wednesday - last fill 7.5.13 Last ov 1.2.13 acute visit/rash

## 2012-06-17 NOTE — Telephone Encounter (Signed)
RX sent

## 2012-06-27 ENCOUNTER — Other Ambulatory Visit (INDEPENDENT_AMBULATORY_CARE_PROVIDER_SITE_OTHER): Payer: Medicare Other

## 2012-06-27 DIAGNOSIS — Z86718 Personal history of other venous thrombosis and embolism: Secondary | ICD-10-CM

## 2012-06-27 DIAGNOSIS — Z86711 Personal history of pulmonary embolism: Secondary | ICD-10-CM

## 2012-06-27 DIAGNOSIS — Z7901 Long term (current) use of anticoagulants: Secondary | ICD-10-CM

## 2012-06-27 LAB — PROTIME-INR: INR: 2.5 ratio — ABNORMAL HIGH (ref 0.8–1.0)

## 2012-06-28 ENCOUNTER — Telehealth: Payer: Self-pay

## 2012-06-28 NOTE — Telephone Encounter (Signed)
Spoke with patient's wife, verbalized understanding of instruction.

## 2012-06-28 NOTE — Telephone Encounter (Signed)
Message copied by Maurice Small on Fri Jun 28, 2012  4:36 PM ------      Message from: Pecola Lawless      Created: Thu Jun 27, 2012  4:12 PM       PT/INR is therapeutic. No change in warfarin dose; repeat PT/INR in 4 weeks       ----- Message -----         From: [facility]         Sent: 06/27/2012   2:32 PM           To: Pecola Lawless, MD

## 2012-07-26 ENCOUNTER — Telehealth: Payer: Self-pay | Admitting: Internal Medicine

## 2012-07-26 MED ORDER — WARFARIN SODIUM 5 MG PO TABS: 5.0000 mg | ORAL_TABLET | Freq: Every day | ORAL | Status: DC

## 2012-07-26 NOTE — Telephone Encounter (Signed)
Refill: Warfarin sodium 5mg  tab #45. Take 1 tablet by mouth once a day except on Wednesdays take 1.5 (one and a half) tablets. Last fill 8.12.13

## 2012-07-26 NOTE — Telephone Encounter (Signed)
RX sent in

## 2012-08-01 ENCOUNTER — Other Ambulatory Visit (INDEPENDENT_AMBULATORY_CARE_PROVIDER_SITE_OTHER): Payer: Medicare Other

## 2012-08-01 ENCOUNTER — Telehealth: Payer: Self-pay

## 2012-08-01 DIAGNOSIS — Z86711 Personal history of pulmonary embolism: Secondary | ICD-10-CM

## 2012-08-01 DIAGNOSIS — Z7901 Long term (current) use of anticoagulants: Secondary | ICD-10-CM

## 2012-08-01 DIAGNOSIS — Z86718 Personal history of other venous thrombosis and embolism: Secondary | ICD-10-CM

## 2012-08-01 LAB — PROTIME-INR
INR: 3 ratio — ABNORMAL HIGH (ref 0.8–1.0)
Prothrombin Time: 31.3 s — ABNORMAL HIGH (ref 10.2–12.4)

## 2012-08-01 NOTE — Telephone Encounter (Signed)
Left message on voicemail with Dr.Hopper's recommendations. Patient to call and confirm message received

## 2012-08-01 NOTE — Telephone Encounter (Signed)
Message copied by Maurice Small on Thu Aug 01, 2012  5:45 PM ------      Message from: Pecola Lawless      Created: Thu Aug 01, 2012  5:14 PM       1/2 usual warfarin dose today ; then resume same schedule; recheck 4 weeks      ----- Message -----         From: [facility]         Sent: 08/01/2012   3:39 PM           To: Pecola Lawless, MD

## 2012-08-02 NOTE — Telephone Encounter (Signed)
Spoke with patient's wife. She didn't get message to late last night. Patient will decrease dosage by 1/2 today and then resume regular schedule and recheck in 4 weeks.  Patient's current regimen is 1 1/2 on T/Th/Sa and 1 all other days. No missed doses or change in diet

## 2012-08-30 ENCOUNTER — Other Ambulatory Visit: Payer: Self-pay | Admitting: Internal Medicine

## 2012-08-30 MED ORDER — WARFARIN SODIUM 5 MG PO TABS: 5.0000 mg | ORAL_TABLET | Freq: Every day | ORAL | Status: DC

## 2012-08-30 NOTE — Telephone Encounter (Signed)
Refill done.  

## 2012-08-30 NOTE — Telephone Encounter (Signed)
refill Warfarin Sodium (Tab) COUMADIN 5 MG Take 1 tablet (5 mg total) by mouth daily. Except 1 & 1/2 tablets on Wednesday #45 last fill 09.20.13---last ov 1.7.13 acute --last PT ck 9.26.13

## 2012-09-03 ENCOUNTER — Other Ambulatory Visit (INDEPENDENT_AMBULATORY_CARE_PROVIDER_SITE_OTHER): Payer: Medicare Other

## 2012-09-03 DIAGNOSIS — Z86711 Personal history of pulmonary embolism: Secondary | ICD-10-CM

## 2012-09-03 DIAGNOSIS — Z7901 Long term (current) use of anticoagulants: Secondary | ICD-10-CM

## 2012-09-03 DIAGNOSIS — Z86718 Personal history of other venous thrombosis and embolism: Secondary | ICD-10-CM

## 2012-09-04 ENCOUNTER — Telehealth: Payer: Self-pay

## 2012-09-04 NOTE — Telephone Encounter (Signed)
Message copied by Maurice Small on Wed Sep 04, 2012 10:02 AM ------      Message from: Pecola Lawless      Created: Tue Sep 03, 2012  5:53 PM       PT/INR is therapeutic. No change in warfarin dose; repeat PT/INR in 4 weeks       ----- Message -----         From: [facility]         Sent: 09/03/2012   5:04 PM           To: Pecola Lawless, MD

## 2012-09-04 NOTE — Telephone Encounter (Signed)
Spoke with [name] (patien's wife), informed her no change and recheck in 4 weeks. Patient 's wife verbalized understanding

## 2012-09-18 ENCOUNTER — Ambulatory Visit (INDEPENDENT_AMBULATORY_CARE_PROVIDER_SITE_OTHER): Payer: Medicare Other | Admitting: Internal Medicine

## 2012-09-18 ENCOUNTER — Encounter: Payer: Self-pay | Admitting: Internal Medicine

## 2012-09-18 VITALS — BP 126/78 | HR 52 | Temp 98.2°F | Resp 14 | Ht 65.03 in | Wt 286.0 lb

## 2012-09-18 DIAGNOSIS — G4730 Sleep apnea, unspecified: Secondary | ICD-10-CM

## 2012-09-18 DIAGNOSIS — I10 Essential (primary) hypertension: Secondary | ICD-10-CM

## 2012-09-18 DIAGNOSIS — Z23 Encounter for immunization: Secondary | ICD-10-CM

## 2012-09-18 DIAGNOSIS — M109 Gout, unspecified: Secondary | ICD-10-CM

## 2012-09-18 DIAGNOSIS — E119 Type 2 diabetes mellitus without complications: Secondary | ICD-10-CM

## 2012-09-18 DIAGNOSIS — E782 Mixed hyperlipidemia: Secondary | ICD-10-CM

## 2012-09-18 LAB — BASIC METABOLIC PANEL
BUN: 15 mg/dL (ref 6–23)
Chloride: 97 mEq/L (ref 96–112)
Glucose, Bld: 98 mg/dL (ref 70–99)
Potassium: 4.5 mEq/L (ref 3.5–5.1)

## 2012-09-18 LAB — HEPATIC FUNCTION PANEL
ALT: 22 U/L (ref 0–53)
AST: 23 U/L (ref 0–37)
Albumin: 3.9 g/dL (ref 3.5–5.2)

## 2012-09-18 LAB — LDL CHOLESTEROL, DIRECT: Direct LDL: 153 mg/dL

## 2012-09-18 LAB — LIPID PANEL
Cholesterol: 242 mg/dL — ABNORMAL HIGH (ref 0–200)
Total CHOL/HDL Ratio: 5

## 2012-09-18 LAB — MICROALBUMIN / CREATININE URINE RATIO: Microalb, Ur: 2 mg/dL — ABNORMAL HIGH (ref 0.0–1.9)

## 2012-09-18 LAB — URIC ACID: Uric Acid, Serum: 7.7 mg/dL (ref 4.0–7.8)

## 2012-09-18 LAB — HEMOGLOBIN A1C: Hgb A1c MFr Bld: 6.7 % — ABNORMAL HIGH (ref 4.6–6.5)

## 2012-09-18 NOTE — Patient Instructions (Addendum)
Please review the medication list in the After Visit Summary provided.Please write the name of the prescribing physician to the right of the medication and share this with all medical staff seen at each appointment. This will help provide continuity of care; help optimize therapeutic interventions;and help prevent drug:drug adverse reaction.  Blood Pressure Goal is < 140/90,ideal is an AVERAGE < 135/85. This AVERAGE should be calculated from @ least 5-7 BP readings taken @ different times of day on different days of week. You should not respond to isolated BP readings , but rather the AVERAGE for that week

## 2012-09-18 NOTE — Assessment & Plan Note (Signed)
Urine microalbumin and A1c will be checked. If A1c is uncontrolled; glucose monitoring will be encouraged

## 2012-09-18 NOTE — Assessment & Plan Note (Signed)
He is off statin and not following a heart healthy diet. Lipids will be checked

## 2012-09-18 NOTE — Progress Notes (Signed)
  Subjective:    Patient ID: Logan Valdez, male    DOB: 12-01-1945, 66 y.o.   MRN: [id number]  HPI HYPERTENSION: Disease Monitoring: Blood pressure range-in range @ MD appts  Medications: Compliance- yes   DIABETES: Disease Monitoring: Blood Sugar ranges-no monitor  Polyuria/phagia/dipsia-  no     Visual problems-watery & blurring OS Opth exam-overdue Medications: Compliance- no meds Diet- none  HYPERLIPIDEMIA: Disease Monitoring: See symptoms for Hypertension Medications: Compliance- Simvastatin D/Ced for ? reason           Review of SystemsChest pain, palpitations-no       Dyspnea- no Lightheadedness,Syncope- no    Edema- no Abd pain, bowel changes- occasional constipation due to narcotics  Muscle aches- no; no gout for > 1 year      Objective:   Physical Exam Gen.:  well-nourished in appearance. Alert, appropriate and cooperative throughout exam. Weight excess Head: Normocephalic without obvious abnormalities  Eyes: No corneal or conjunctival inflammation noted.  Nose: External nasal exam reveals no deformity or inflammation. Nasal mucosa are pink and moist. No lesions or exudates noted.  Mouth: Oral mucosa and oropharynx reveal no lesions or exudates. Teeth in good repair. Neck: No deformities, masses, or tenderness noted.  Thyroid normal. Lungs: Normal respiratory effort; chest expands symmetrically. Lungs are clear to auscultation without rales, wheezes, or increased work of breathing. Heart: Slow rate and regular rhythm. Normal S1 and S2. No gallop, click, or rub. No murmur. Abdomen: Bowel sounds normal; abdomen soft and nontender. No masses, organomegaly or hernias noted.  Musculoskeletal/extremities: There is some asymmetry of the posterior thoracic musculature suggesting occult scoliosis.  No clubbing, cyanosis, edema, or deformity noted. Tone & strength  normal.Joints normal. Nail health  good. Vascular: Carotid, radial artery,  and  posterior  tibial pulses are full and equal. No bruits present.Dorsalis pedis pulses decreased Neurologic: Not oriented to date or initially the President. Deep tendon reflexes symmetrical and normal. Light touch normal over feet.           Skin: Intact without suspicious lesions or rashes. Lymph: No cervical, axillary lymphadenopathy present. Psych: Mood and affect are normal. Normally interactive                                                                                         Assessment & Plan:

## 2012-09-18 NOTE — Assessment & Plan Note (Signed)
He has been gout free for over a year; uric acid level will be checked. He will verify which medication needs refilling. He is aware that he cannot take Indocin with the warfarin

## 2012-10-01 ENCOUNTER — Other Ambulatory Visit: Payer: Self-pay | Admitting: Internal Medicine

## 2012-10-01 MED ORDER — WARFARIN SODIUM 5 MG PO TABS: 5.0000 mg | ORAL_TABLET | Freq: Every day | ORAL | Status: DC

## 2012-10-01 NOTE — Telephone Encounter (Signed)
RX sent

## 2012-10-01 NOTE — Telephone Encounter (Signed)
refill Warfarin Sodium (Tab) 5 MG Take 1 tablet (5 mg total) by mouth daily. Except 1 & 1/2 tablets on Wednesday #45, last fill 10.25.13, last ov 11.13.13 meds mgmt

## 2012-10-16 ENCOUNTER — Other Ambulatory Visit (INDEPENDENT_AMBULATORY_CARE_PROVIDER_SITE_OTHER): Payer: Medicare Other

## 2012-10-16 DIAGNOSIS — Z7901 Long term (current) use of anticoagulants: Secondary | ICD-10-CM

## 2012-10-16 DIAGNOSIS — Z86718 Personal history of other venous thrombosis and embolism: Secondary | ICD-10-CM

## 2012-10-16 DIAGNOSIS — Z86711 Personal history of pulmonary embolism: Secondary | ICD-10-CM

## 2012-10-16 LAB — PROTIME-INR
INR: 3.5 ratio — ABNORMAL HIGH (ref 0.8–1.0)
Prothrombin Time: 36.5 s — ABNORMAL HIGH (ref 10.2–12.4)

## 2012-10-17 ENCOUNTER — Telehealth: Payer: Self-pay

## 2012-10-17 NOTE — Telephone Encounter (Signed)
Left message on VM for patient to return call when available  

## 2012-10-17 NOTE — Telephone Encounter (Signed)
Spoke with patient's wife, patient is currently taking 1 1/2 tab= (7.5) tab on T/Th/Sat, 1 tab= (5 mg )all other days   Per Dr.Hopper's instruction patient to now take 1 1/2 tab= (7.5 mg) on M/F and 1 tab=(5 mg) all other days and recheck in 3 weeks (weekly dose decreased by 2.5 mg), patient's wife verbalized understanding by repeating instruction back

## 2012-10-17 NOTE — Telephone Encounter (Signed)
Message copied by Maurice Small on Thu Oct 17, 2012 11:50 AM ------      Message from: Pecola Lawless      Created: Wed Oct 16, 2012  5:53 PM       Please decrease the warfarin dose by 2.5 mg the same day each week ; recheck  in  3 weeks due to rise                                                                                                    ----- Message -----         From: [facility]         Sent: 10/16/2012   4:37 PM           To: Pecola Lawless, MD

## 2012-10-19 ENCOUNTER — Encounter: Payer: Self-pay | Admitting: *Deleted

## 2012-10-28 ENCOUNTER — Other Ambulatory Visit: Payer: Self-pay | Admitting: Internal Medicine

## 2012-10-28 MED ORDER — WARFARIN SODIUM 5 MG PO TABS: 5.0000 mg | ORAL_TABLET | Freq: Every day | ORAL | Status: DC

## 2012-10-28 NOTE — Telephone Encounter (Signed)
refill Warfarin Sodium (Tab) 5 MG Take 1 tablet (5 mg total) by mouth daily. Except 1 & 1/2 tablets on Wednesday  #45 last fill 11.26.13

## 2012-11-26 ENCOUNTER — Telehealth: Payer: Self-pay | Admitting: Internal Medicine

## 2012-11-26 MED ORDER — WARFARIN SODIUM 5 MG PO TABS: 5.0000 mg | ORAL_TABLET | Freq: Every day | ORAL | Status: DC

## 2012-11-26 NOTE — Telephone Encounter (Signed)
Refill: Warfarin sodium 5mg  tab #45. Take 1 tablet by mouth daily, except on Wednesday take 1.5 tablets. Last fill 10-28-12

## 2012-12-12 ENCOUNTER — Other Ambulatory Visit: Payer: Self-pay | Admitting: Internal Medicine

## 2012-12-12 MED ORDER — METOPROLOL SUCCINATE ER 100 MG PO TB24: ORAL_TABLET | ORAL | Status: DC

## 2012-12-12 NOTE — Telephone Encounter (Signed)
refill Metoprolol Succinate TER 100 MG Take 100 mg by mouth. 1/2 by mouth daily #90 last fill 2.18.13

## 2012-12-23 ENCOUNTER — Telehealth: Payer: Self-pay | Admitting: Internal Medicine

## 2012-12-23 NOTE — Telephone Encounter (Signed)
Refill: warfarin sodium 5 mg tab #32. Take 1 tablet by mouth daily. Except 1.5 tablets on Wednesday. Last fill 11-26-12

## 2012-12-24 ENCOUNTER — Telehealth: Payer: Self-pay

## 2012-12-24 ENCOUNTER — Other Ambulatory Visit (INDEPENDENT_AMBULATORY_CARE_PROVIDER_SITE_OTHER): Payer: Medicare Other

## 2012-12-24 DIAGNOSIS — Z86711 Personal history of pulmonary embolism: Secondary | ICD-10-CM

## 2012-12-24 DIAGNOSIS — Z7901 Long term (current) use of anticoagulants: Secondary | ICD-10-CM

## 2012-12-24 DIAGNOSIS — Z86718 Personal history of other venous thrombosis and embolism: Secondary | ICD-10-CM

## 2012-12-24 MED ORDER — WARFARIN SODIUM 5 MG PO TABS: 5.0000 mg | ORAL_TABLET | Freq: Every day | ORAL | Status: DC

## 2012-12-24 NOTE — Telephone Encounter (Signed)
Left message on Vm for patient to return call  

## 2012-12-24 NOTE — Telephone Encounter (Signed)
Spoke with patient's wife, indicated she will try her best to take patient to have PT/INR checked today at [facility]. I stressed the importance of having this done in order to continue refilling warfarin. Patient's wife verbalized understanding

## 2012-12-24 NOTE — Telephone Encounter (Signed)
Message copied by Maurice Small on Tue Dec 24, 2012  2:29 PM ------      Message from: Pecola Lawless      Created: Tue Dec 24, 2012  1:08 PM       No change; repeat 1 month      ----- Message -----         From: [facility]         Sent: 12/24/2012  12:31 PM           To: Pecola Lawless, MD                   ------

## 2012-12-24 NOTE — Telephone Encounter (Signed)
Left message on VM informing patient no change and recheck in 4 weeks. Patient to call and confirm that he received RX

## 2012-12-25 NOTE — Telephone Encounter (Signed)
Spoke with patient's wife, instructions received.

## 2012-12-27 ENCOUNTER — Encounter: Payer: Self-pay | Admitting: Nurse Practitioner

## 2013-01-20 ENCOUNTER — Telehealth: Payer: Self-pay | Admitting: Internal Medicine

## 2013-01-20 MED ORDER — WARFARIN SODIUM 5 MG PO TABS: 5.0000 mg | ORAL_TABLET | Freq: Every day | ORAL | Status: DC

## 2013-01-20 NOTE — Telephone Encounter (Signed)
Sent electronically 

## 2013-01-20 NOTE — Telephone Encounter (Signed)
Refill- warfarin sodium 5mg  tab. Take one tablet by mouth every day, except take one and one half tablets every Wednesday. Qty 32 last fill 2.18.14

## 2013-01-23 ENCOUNTER — Telehealth: Payer: Self-pay

## 2013-01-23 ENCOUNTER — Ambulatory Visit (INDEPENDENT_AMBULATORY_CARE_PROVIDER_SITE_OTHER): Payer: Medicare Other | Admitting: Nurse Practitioner

## 2013-01-23 ENCOUNTER — Other Ambulatory Visit (INDEPENDENT_AMBULATORY_CARE_PROVIDER_SITE_OTHER): Payer: Medicare Other

## 2013-01-23 ENCOUNTER — Encounter: Payer: Self-pay | Admitting: Nurse Practitioner

## 2013-01-23 VITALS — BP 148/71 | HR 52 | Ht 67.5 in | Wt 289.0 lb

## 2013-01-23 DIAGNOSIS — R413 Other amnesia: Secondary | ICD-10-CM

## 2013-01-23 NOTE — Telephone Encounter (Signed)
Spoke with Logan Valdez, she verbalized instructions

## 2013-01-23 NOTE — Telephone Encounter (Signed)
Message copied by Maurice Small on Thu Jan 23, 2013  4:59 PM ------      Message from: Pecola Lawless      Created: Thu Jan 23, 2013  3:22 PM       PT/INR is therapeutic. No change in warfarin dose; repeat PT/INR in 4 weeks      ----- Message -----         From: [facility]         Sent: 01/23/2013   1:50 PM           To: Pecola Lawless, MD                   ------

## 2013-01-23 NOTE — Progress Notes (Signed)
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 He has risk factors of diabetes and hypertension obesity and hyperlipidemia. At the time of his stroke he was found to have a PFO. He elected not to have disclosed. He denies stroke or TIA symptoms since last seen. Memory is stable. He remains on Coumadin with minimal bruising. No behavior problems, sleeping well. He made on Namenda 10 mg twice a day.  ROS:   Negative  Physical Exam General: well developed, well nourished, seated, in no evident distress Head: head normocephalic and atraumatic. Orohparynx benign Neck: supple with no carotid or supraclavicular bruits Cardiovascular: regular rate and rhythm, no murmurs  Neurologic Exam Mental Status: Awake and fully alert. Oriented to place and time. Recent and remote memory intact. Attention span, concentration and fund of knowledge appropriate. Mood and affect appropriate. MMSE 20/30.  Cranial Nerves: Pupils equal, briskly reactive to light. Extraocular movements full without nystagmus. Visual fields full to confrontation. Hard of Hearing. Facial sensation intact. Face, tongue, palate move normally and symmetrically. Neck flexion and extension normal.  Motor: Normal bulk and tone. Normal strength in all tested extremity muscles. Sensory.: intact to touch and pinprick and vibratory.  Coordination: Rapid alternating movements normal in all extremities. Finger-to-nose and heel-to-shin performed accurately bilaterally. Gait and Station: Arises from chair without difficulty but guarded. Has back pain. . Gait demonstrates normal stride length and balance . Able to heel, toe and tandem walk without difficulty.  Reflexes: 1+ and symmetric. Toes downgoing.     ASSESSMENT:: History of left posterior cerebral artery infarct October 2009. No further stroke TIA symptoms. Memory problems which have improved on Namenda. Memory testing is constrained  by his limited education.     PLAN:  He will continue his Namenda 10 mg twice daily until his next refill and then he will be switched to Namenda 28 mg XR daily. He will continue his Coumadin for secondary stroke prevention. Keep systolic blood pressure less than 130. Watch portion control in terms of his diet.PQRS dememtia measures obtained   Nilda Riggs, GNP-BC APRN

## 2013-01-23 NOTE — Patient Instructions (Addendum)
Continue Namenda 10mg  twice daily.  Call when refill is due and will change  to Namenda 28mg  XR at that time.  Continue Coumadin for secondary stroke prevention Watch portion control on diet  Keep blood pressure at 130 systolic or less.  Fu 6 months

## 2013-02-12 ENCOUNTER — Telehealth: Payer: Self-pay

## 2013-02-12 MED ORDER — METOPROLOL TARTRATE 25 MG PO TABS: 25.0000 mg | ORAL_TABLET | Freq: Two times a day (BID) | ORAL | Status: DC

## 2013-02-12 NOTE — Addendum Note (Signed)
Addended by: Candie Echevaria L on: 02/12/2013 03:17 PM   Modules accepted: Orders

## 2013-02-12 NOTE — Telephone Encounter (Signed)
Patient took Metoprolol 1 by mouth daily a couple of days, instructions state 1/2 by mouth daily. RX not able to be renewed until 02/18/13, patient only has enough medication to last 2 more days. Patients wife would like to know if patient will be ok to skip a few days or if New rx to be submitted for 1 by mouth daily (patient will only take 1/2)   Hopp please advise

## 2013-02-12 NOTE — Telephone Encounter (Signed)
I recommend changing to generic metoprolol 25 mg twice a day, dispense 180. This would be the best manner controlling hypertension and any cardiac irregularities. The should be very inexpensive and may be on "$4 list "at [employer] or [employer].

## 2013-02-12 NOTE — Telephone Encounter (Signed)
Discuss with patient wife 

## 2013-03-03 ENCOUNTER — Other Ambulatory Visit: Payer: Self-pay | Admitting: *Deleted

## 2013-03-03 MED ORDER — WARFARIN SODIUM 5 MG PO TABS: 5.0000 mg | ORAL_TABLET | Freq: Every day | ORAL | Status: DC

## 2013-03-03 NOTE — Telephone Encounter (Signed)
Rx sent 

## 2013-03-11 ENCOUNTER — Other Ambulatory Visit (INDEPENDENT_AMBULATORY_CARE_PROVIDER_SITE_OTHER): Payer: Medicare Other

## 2013-03-11 DIAGNOSIS — Z86711 Personal history of pulmonary embolism: Secondary | ICD-10-CM

## 2013-03-11 DIAGNOSIS — Z86718 Personal history of other venous thrombosis and embolism: Secondary | ICD-10-CM

## 2013-03-11 DIAGNOSIS — Z7901 Long term (current) use of anticoagulants: Secondary | ICD-10-CM

## 2013-03-11 LAB — PROTIME-INR: INR: 2.3 ratio — ABNORMAL HIGH (ref 0.8–1.0)

## 2013-03-12 ENCOUNTER — Telehealth: Payer: Self-pay

## 2013-03-12 NOTE — Telephone Encounter (Signed)
Message copied by Maurice Small on Wed Mar 12, 2013  3:57 PM ------      Message from: Pecola Lawless      Created: Tue Mar 11, 2013  5:33 PM       No change; recheck 1 month      ----- Message -----         From: [facility]         Sent: 03/11/2013   5:17 PM           To: Pecola Lawless, MD                   ------

## 2013-03-12 NOTE — Telephone Encounter (Signed)
Left message on voicemail for patient to call and confirm that he received message and instructions

## 2013-03-13 NOTE — Telephone Encounter (Signed)
Patient's spouse called stating she received and understood your message regarding pt/inr results.

## 2013-03-13 NOTE — Telephone Encounter (Signed)
Noted  

## 2013-03-20 ENCOUNTER — Other Ambulatory Visit: Payer: Self-pay

## 2013-03-20 MED ORDER — MEMANTINE HCL 10 MG PO TABS: 10.0000 mg | ORAL_TABLET | Freq: Two times a day (BID) | ORAL | Status: DC

## 2013-03-25 ENCOUNTER — Other Ambulatory Visit: Payer: Self-pay

## 2013-03-25 MED ORDER — GABAPENTIN 100 MG PO CAPS: 100.0000 mg | ORAL_CAPSULE | Freq: Three times a day (TID) | ORAL | Status: DC | PRN

## 2013-04-01 ENCOUNTER — Other Ambulatory Visit: Payer: Self-pay | Admitting: *Deleted

## 2013-04-01 MED ORDER — WARFARIN SODIUM 5 MG PO TABS: 5.0000 mg | ORAL_TABLET | Freq: Every day | ORAL | Status: DC

## 2013-04-01 NOTE — Telephone Encounter (Signed)
Rx sent 

## 2013-04-08 ENCOUNTER — Other Ambulatory Visit (INDEPENDENT_AMBULATORY_CARE_PROVIDER_SITE_OTHER): Payer: Medicare Other

## 2013-04-08 ENCOUNTER — Telehealth: Payer: Self-pay

## 2013-04-08 DIAGNOSIS — Z86718 Personal history of other venous thrombosis and embolism: Secondary | ICD-10-CM

## 2013-04-08 DIAGNOSIS — Z86711 Personal history of pulmonary embolism: Secondary | ICD-10-CM

## 2013-04-08 DIAGNOSIS — Z7901 Long term (current) use of anticoagulants: Secondary | ICD-10-CM

## 2013-04-08 LAB — PROTIME-INR: Prothrombin Time: 31.6 s — ABNORMAL HIGH (ref 10.2–12.4)

## 2013-04-08 NOTE — Telephone Encounter (Signed)
Per Dr.Hopper Alwyn Ren 1/2 pill today then resume regular schedule: 1 by mouth daily EXCEPT 1 1/2 on M/F, spoke with patient's wife, verbalized understanding

## 2013-04-09 ENCOUNTER — Ambulatory Visit (INDEPENDENT_AMBULATORY_CARE_PROVIDER_SITE_OTHER): Payer: Medicare Other | Admitting: Internal Medicine

## 2013-04-09 ENCOUNTER — Encounter: Payer: Self-pay | Admitting: Internal Medicine

## 2013-04-09 VITALS — BP 132/78 | HR 52 | Wt 290.6 lb

## 2013-04-09 DIAGNOSIS — IMO0002 Reserved for concepts with insufficient information to code with codable children: Secondary | ICD-10-CM

## 2013-04-09 MED ORDER — GABAPENTIN 300 MG PO CAPS: 300.0000 mg | ORAL_CAPSULE | Freq: Three times a day (TID) | ORAL | Status: DC

## 2013-04-09 NOTE — Progress Notes (Signed)
  Subjective:    Patient ID: Logan Valdez, male    DOB: August 07, 1946, 67 y.o.   MRN: [id number]  HPI The pain is described as  sharp  and radiating from the left lateral buttock to the lateral thigh. His narcotic helps ; he's been taking it every every 5 hours; he averages approximately 3 per day. The pain has been worse the last 2-3 months without specific trigger. This is the same pain he has had for 15-20 years in the context of an L4 superior endplate compression fracture and L5-S1 foraminal narrowing: CT in December 2009. He also has gabapentin 100 mg every 8 hours but is only been taking this twice a day on average. The pain is worse  ambulating .Pain has improved with rest in recliner.No associated signs and symptoms include numbness and tingling in the left  leg . Limb weakness has not been present. No stool or urinary incontinence.     Review of Systems    Constitutional: No fever, chills, sweats, unexplained weight loss GI: No abdominal pain;  melena; rectal bleeding GU: No hematuria, pyuria, or dysuria MS: No joint stiffness;redness Heme/Lymph:No abnormal bruising or bleeding                                                                               Objective:   Physical Exam  Gen.: Well-nourished in appearance but weight excess. Alert, appropriate and cooperative throughout exam.  Neck: No deformities, masses, or tenderness noted. Range of motion good.                            Musculoskeletal/extremities: No deformity or scoliosis noted of  the thoracic or lumbar spine. No clubbing, cyanosis, edema, or significant extremity  deformity noted. Tone & strength  normal.Joints normal . Unable to lie down due to pain Neurologic: Alert and not oriented to date or President. Deep tendon reflexes symmetrical and normal. Gait with flexed thorax        Skin: Intact without suspicious lesions or rashes. Lymph: No cervical, axillary lymphadenopathy present. Psych: Mood and  affect are normal. Normally interactive                Assessment & Plan:  #1 chronic L-4 radiculopathy with recent exacerbation of symptoms without specific trigger  Plan: Options are limited because of his inability to stop warfarin in the context of coagulopathy. The gabapentin should be increased to 300 mg every 8 hours as a trial. I'll defer to Dr. Ethelene Hal as to the initiation of continuous pain release medication such as fentanyl patch.

## 2013-04-09 NOTE — Patient Instructions (Addendum)
Increase gabapentin from 100 mg every 12 hours to 2 pills every 12 hours and assess response. The prescription will be changed to gabapentin 300 mg one every 8-12 hours as needed.

## 2013-05-08 ENCOUNTER — Other Ambulatory Visit: Payer: Self-pay | Admitting: Internal Medicine

## 2013-05-15 ENCOUNTER — Telehealth: Payer: Self-pay

## 2013-05-15 ENCOUNTER — Other Ambulatory Visit (INDEPENDENT_AMBULATORY_CARE_PROVIDER_SITE_OTHER): Payer: Medicare Other

## 2013-05-15 DIAGNOSIS — Z86711 Personal history of pulmonary embolism: Secondary | ICD-10-CM

## 2013-05-15 DIAGNOSIS — Z7901 Long term (current) use of anticoagulants: Secondary | ICD-10-CM

## 2013-05-15 DIAGNOSIS — Z86718 Personal history of other venous thrombosis and embolism: Secondary | ICD-10-CM

## 2013-05-15 NOTE — Telephone Encounter (Signed)
Spoke with patient's wife, discussed instructions for blood thinner. Mrs.Everitt verbalized understanding

## 2013-05-15 NOTE — Telephone Encounter (Signed)
Message copied by Maurice Small on Thu May 15, 2013  4:54 PM ------      Message from: Pecola Lawless      Created: Thu May 15, 2013 12:52 PM       PT/INR is therapeutic. No change in warfarin dose; repeat PT/INR in 4 weeks      ----- Message -----         From: [facility]         Sent: 05/15/2013  12:36 PM           To: Pecola Lawless, MD                   ------

## 2013-05-19 ENCOUNTER — Other Ambulatory Visit: Payer: Self-pay | Admitting: *Deleted

## 2013-05-19 DIAGNOSIS — I10 Essential (primary) hypertension: Secondary | ICD-10-CM

## 2013-05-19 MED ORDER — METOPROLOL TARTRATE 25 MG PO TABS: 25.0000 mg | ORAL_TABLET | Freq: Two times a day (BID) | ORAL | Status: DC

## 2013-05-19 NOTE — Telephone Encounter (Signed)
Refilled patient prescription for Metoprolol 25 mg medication was e-scribed to [facility] in [place].

## 2013-05-30 ENCOUNTER — Telehealth: Payer: Self-pay | Admitting: Internal Medicine

## 2013-05-30 NOTE — Telephone Encounter (Signed)
Unfortunately he cannot come off the Coumadin because of the increased risk of recurrent DVT, pulmonary embolism ,or  stroke  Due to his coagulopathy. This would need to be discussed with his dentist. He may wish to refer him to oral surgery.

## 2013-05-30 NOTE — Telephone Encounter (Signed)
Patients wife called in stating that patient is supposed to have a tooth extraction done and would like to know if he should have this procedure done since he is taking coumedin?

## 2013-05-30 NOTE — Telephone Encounter (Signed)
Spoke with patient's wife and advised of Dr. Frederik Pear advice. Wife states that they have a consult with Chiropractor. Will advise what they direct them to do.

## 2013-05-30 NOTE — Telephone Encounter (Signed)
Hopp please advise  

## 2013-06-07 ENCOUNTER — Other Ambulatory Visit: Payer: Self-pay | Admitting: Internal Medicine

## 2013-06-16 ENCOUNTER — Telehealth: Payer: Self-pay

## 2013-06-16 NOTE — Telephone Encounter (Signed)
Left message on voicemail informing patient he needs to walk-in at [facility] to have PT/INR checked, per MD request

## 2013-06-16 NOTE — Telephone Encounter (Signed)
Message copied by Maurice Small on Mon Jun 16, 2013  8:54 AM ------      Message from: Pecola Lawless      Created: Sun Jun 15, 2013  9:21 AM       PT/INR needed as pre op; it was 2.5 on 05/15/13 ------

## 2013-06-23 ENCOUNTER — Telehealth: Payer: Self-pay | Admitting: *Deleted

## 2013-06-23 ENCOUNTER — Telehealth: Payer: Self-pay | Admitting: Internal Medicine

## 2013-06-23 ENCOUNTER — Ambulatory Visit (INDEPENDENT_AMBULATORY_CARE_PROVIDER_SITE_OTHER): Payer: Medicare Other | Admitting: *Deleted

## 2013-06-23 DIAGNOSIS — Z8672 Personal history of thrombophlebitis: Secondary | ICD-10-CM

## 2013-06-23 LAB — PROTIME-INR: INR: 3 ratio — ABNORMAL HIGH (ref 0.8–1.0)

## 2013-06-23 NOTE — Telephone Encounter (Signed)
PT/INR results faxed.

## 2013-06-23 NOTE — Telephone Encounter (Signed)
Patient's wife is calling to see if ok to proceed with dental surgery tomorrow at 10am? Labs to be reviewed by provider. Will follow up with provider.

## 2013-06-23 NOTE — Telephone Encounter (Signed)
Put in future orders for PT/INR for pt who is at [facility].//AB/CMA

## 2013-06-23 NOTE — Telephone Encounter (Signed)
Patient's wife is calling and states that Dr. Barbara Cower Mohorn's office needs his lab results from today sent to them today before 3pm to determine whether he will have his surgery on tomorrow. Fax#: [phone]

## 2013-06-24 ENCOUNTER — Telehealth: Payer: Self-pay

## 2013-06-24 ENCOUNTER — Telehealth: Payer: Self-pay | Admitting: Neurology

## 2013-06-24 ENCOUNTER — Telehealth: Payer: Self-pay | Admitting: *Deleted

## 2013-06-24 ENCOUNTER — Other Ambulatory Visit: Payer: Self-pay

## 2013-06-24 MED ORDER — FOLTANX 3-35-2 MG PO TABS: 1.0000 | ORAL_TABLET | Freq: Two times a day (BID) | ORAL | Status: DC

## 2013-06-24 NOTE — Telephone Encounter (Signed)
Message copied by Verdie Shire on Tue Jun 24, 2013  3:58 PM ------      Message from: Pecola Lawless      Created: Mon Jun 23, 2013  1:38 PM         1/2 of regular dose today (or tomorrow if already taken today); then continue same schedule & recheck in 4 weeks      ----- Message -----         From: [facility]         Sent: 06/23/2013  12:50 PM           To: Pecola Lawless, MD                   ------

## 2013-06-24 NOTE — Telephone Encounter (Signed)
Per Dr. Frederik Pear verbal order, hold evening dose. Called to advise patient, spouse states that he has already taken the evening dose. Per Dr. Frederik Pear recommendations patient should NOT have the procedure to the increased risk of excessive bleeding. Patient may reschedule for any other day this week and hold the evening dose the evening before the surgery. Advised spouse for future reference that Coumadin is usual held before any type of surgical procedure. Spouse states understanding.  Additionally, the patient did not take his evening dose on Sunday.

## 2013-06-24 NOTE — Telephone Encounter (Signed)
Patient prescription has been sent in.

## 2013-06-24 NOTE — Telephone Encounter (Signed)
Pt's wife [name], is requesting someone return her call concerning pt's medication memantine (NAMENDA) 10 MG tablet, states per Dr. [name], this was going to be changed over to another prescription. Can someone please call [name] concerning this matter. Thanks

## 2013-06-24 NOTE — Telephone Encounter (Signed)
Spoke with the pt's wife and informed her of the pt's recent lab results and note.  Wife understood and agreed.//AB/CMA

## 2013-07-08 ENCOUNTER — Other Ambulatory Visit: Payer: Self-pay | Admitting: Internal Medicine

## 2013-07-08 NOTE — Telephone Encounter (Signed)
Medication filled. SW, CMA 

## 2013-07-21 ENCOUNTER — Other Ambulatory Visit (INDEPENDENT_AMBULATORY_CARE_PROVIDER_SITE_OTHER): Payer: Medicare Other

## 2013-07-21 DIAGNOSIS — IMO0002 Reserved for concepts with insufficient information to code with codable children: Secondary | ICD-10-CM

## 2013-07-21 DIAGNOSIS — Z79899 Other long term (current) drug therapy: Secondary | ICD-10-CM

## 2013-07-24 ENCOUNTER — Encounter: Payer: Self-pay | Admitting: Neurology

## 2013-07-24 ENCOUNTER — Ambulatory Visit (INDEPENDENT_AMBULATORY_CARE_PROVIDER_SITE_OTHER): Payer: Medicare Other | Admitting: Neurology

## 2013-07-24 VITALS — BP 161/64 | HR 64 | Temp 97.9°F | Ht 66.0 in | Wt 287.0 lb

## 2013-07-24 DIAGNOSIS — I63219 Cerebral infarction due to unspecified occlusion or stenosis of unspecified vertebral arteries: Secondary | ICD-10-CM

## 2013-07-24 NOTE — Patient Instructions (Addendum)
Continue warfarin for stroke prevention and Namenda for memory loss. I have given him samples of Namenda XR 28 mg and if this is effective I have advised him to call me for a prescription. Check carotid ultrasound study. Return for followup in 6 months with Darrol Angel, NP a call earlier if necessary.

## 2013-07-26 NOTE — Progress Notes (Signed)
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 He has risk factors of diabetes and hypertension obesity and hyperlipidemia. At the time of his stroke he was found to have a PFO. He elected not to have disclosed. He denies stroke or TIA symptoms since last seen. Memory is stable. He remains on Coumadin with minimal bruising. No behavior problems, sleeping well. He made on Namenda 10 mg twice a day. Update  07/24/2013 he returns for followup after last visit on 01/23/13 with Darrol Angel, NP. He states he is doing well and still has mild short-term memory difficulties which appear to be unchanged. Patient was asked to transition to Namenda XR 28 mg.  at the last visit but that has not yet happened. He remains on Namenda 10 mg twice daily. He had his left wisdom tooth pulled and has some residual pain from that. He has been taking gabapentin 3 times daily for low back pain which seems to be helping. He has no new complaints today.  ROS:   Positive for memory loss and tooth pain  Physical Exam General: well developed, well nourished, seated, in no evident distress Head: head normocephalic and atraumatic. Orohparynx benign Neck: supple with no carotid or supraclavicular bruits Cardiovascular: regular rate and rhythm, no murmurs Filed Vitals:   07/24/13 1418  BP: 161/64  Pulse: 64  Temp: 97.9 F (36.6 C)    Neurologic Exam Mental Status: Awake and fully alert. Oriented to place and time. Recent and remote memory diminished. Attention span, concentration and fund of knowledge diminished. Mood and affect appropriate. MMSE not done today  Cranial Nerves: Pupils equal, briskly reactive to light. Extraocular movements full without nystagmus. Visual fields full to confrontation. Hard of Hearing. Facial sensation intact. Face, tongue, palate move normally and symmetrically. Neck flexion and extension normal.  Motor: Normal bulk and tone. Normal  strength in all tested extremity muscles. Sensory.: intact to touch and pinprick and vibratory sensation.  Coordination: Rapid alternating movements normal in all extremities. Finger-to-nose and heel-to-shin performed accurately bilaterally. Gait and Station: Arises from chair without difficulty but guarded. Has back pain. . Gait demonstrates normal stride length and balance . Able to heel, toe and tandem walk without difficulty.  Reflexes: 1+ and symmetric. Toes downgoing.     ASSESSMENT:: History of left posterior cerebral artery infarct October 2009. No further stroke TIA symptoms. Memory problems which have improved on Namenda. Memory testing is constrained by his limited education.     PLAN:   Continue warfarin for stroke prevention and Namenda for memory loss. I have given him samples of Namenda XR 28 mg and if this is effective I have advised him to call me for a prescription. Check carotid ultrasound study. Return for followup in 6 months with Darrol Angel, NP a call earlier if necessary.

## 2013-07-31 ENCOUNTER — Ambulatory Visit (INDEPENDENT_AMBULATORY_CARE_PROVIDER_SITE_OTHER): Payer: Medicare Other

## 2013-07-31 DIAGNOSIS — I63219 Cerebral infarction due to unspecified occlusion or stenosis of unspecified vertebral arteries: Secondary | ICD-10-CM

## 2013-08-15 ENCOUNTER — Other Ambulatory Visit: Payer: Self-pay | Admitting: Internal Medicine

## 2013-08-19 ENCOUNTER — Other Ambulatory Visit: Payer: Self-pay | Admitting: Internal Medicine

## 2013-08-19 NOTE — Telephone Encounter (Signed)
Coumadin refill sent to pharmacy 

## 2013-08-21 ENCOUNTER — Ambulatory Visit (INDEPENDENT_AMBULATORY_CARE_PROVIDER_SITE_OTHER): Payer: Medicare Other

## 2013-08-21 DIAGNOSIS — Z23 Encounter for immunization: Secondary | ICD-10-CM

## 2013-09-01 ENCOUNTER — Other Ambulatory Visit: Payer: Self-pay | Admitting: *Deleted

## 2013-09-01 ENCOUNTER — Other Ambulatory Visit (INDEPENDENT_AMBULATORY_CARE_PROVIDER_SITE_OTHER): Payer: Medicare Other

## 2013-09-01 DIAGNOSIS — I10 Essential (primary) hypertension: Secondary | ICD-10-CM

## 2013-09-01 DIAGNOSIS — Z8672 Personal history of thrombophlebitis: Secondary | ICD-10-CM

## 2013-09-01 DIAGNOSIS — Z86718 Personal history of other venous thrombosis and embolism: Secondary | ICD-10-CM

## 2013-09-01 LAB — PROTIME-INR: Prothrombin Time: 23.8 s — ABNORMAL HIGH (ref 10.2–12.4)

## 2013-09-09 ENCOUNTER — Telehealth: Payer: Self-pay | Admitting: Internal Medicine

## 2013-09-09 NOTE — Telephone Encounter (Signed)
Called and gave patient results of PT/INR

## 2013-09-09 NOTE — Telephone Encounter (Signed)
Patient's wife is calling for PT INR results from 09/01/13. Please advise.

## 2013-09-12 ENCOUNTER — Telehealth: Payer: Self-pay | Admitting: Nurse Practitioner

## 2013-09-12 NOTE — Telephone Encounter (Signed)
Called patient to reschedule appt per sethi, couldn't reach patient, left message to call back.

## 2013-09-12 NOTE — Telephone Encounter (Signed)
Called patient to reschedule appt per willis, couldn't reach patient, left message to call back. °

## 2013-09-16 ENCOUNTER — Telehealth: Payer: Self-pay | Admitting: Internal Medicine

## 2013-09-16 NOTE — Telephone Encounter (Signed)
Please advise 

## 2013-09-16 NOTE — Telephone Encounter (Signed)
Patient's wife says that [employer] called her yesterday to set the patient up for a colonoscopy because he is overdue. Patient's wife says that she did not want to set this up without checking with Dr. Alwyn Ren because of the patient's blood disease. Please advise if patient is okay to have colonoscopy.

## 2013-09-16 NOTE — Telephone Encounter (Signed)
Advised that he cannot have a CCS in the traditional manner due to the lifetime coumadin therapy. Gave recommendations for Virtual Colonoscopy. If insurance needs to talk with our office, have them to call us. States that wife was on a 3-way call with insurance and presumably [facility]--no information regarding this call noted. States understanding of recommendations.

## 2013-09-16 NOTE — Telephone Encounter (Signed)
He cannot come off his warfarin for a colonoscopy ( see diagnoses). Certainly a virtual colonoscopy would be a safe option.

## 2013-09-25 ENCOUNTER — Other Ambulatory Visit: Payer: Self-pay | Admitting: Internal Medicine

## 2013-09-28 NOTE — Telephone Encounter (Signed)
Warfarin refilled per protocol 

## 2013-10-04 ENCOUNTER — Other Ambulatory Visit: Payer: Self-pay | Admitting: Internal Medicine

## 2013-10-06 NOTE — Telephone Encounter (Signed)
Metoprolol refilled per protocol 

## 2013-10-07 ENCOUNTER — Telehealth: Payer: Self-pay | Admitting: Neurology

## 2013-10-08 ENCOUNTER — Other Ambulatory Visit (INDEPENDENT_AMBULATORY_CARE_PROVIDER_SITE_OTHER): Payer: Medicare Other

## 2013-10-08 ENCOUNTER — Other Ambulatory Visit: Payer: Self-pay | Admitting: *Deleted

## 2013-10-08 DIAGNOSIS — Z79899 Other long term (current) drug therapy: Secondary | ICD-10-CM

## 2013-10-08 DIAGNOSIS — IMO0002 Reserved for concepts with insufficient information to code with codable children: Secondary | ICD-10-CM

## 2013-10-08 LAB — PROTIME-INR: INR: 2.3 ratio — ABNORMAL HIGH (ref 0.8–1.0)

## 2013-10-09 ENCOUNTER — Telehealth: Payer: Self-pay | Admitting: *Deleted

## 2013-10-09 NOTE — Telephone Encounter (Signed)
LMOM @ (4:21pm) informing the pt of recent PT/INR results and note below.//AB/CMA

## 2013-10-09 NOTE — Telephone Encounter (Signed)
Message copied by Verdie Shire on Thu Oct 09, 2013  4:21 PM ------      Message from: Pecola Lawless      Created: Thu Oct 09, 2013  9:55 AM       PT/INR is therapeutic. No change in warfarin dose; repeat PT/INR in 4 weeks      ----- Message -----         From: [facility]         Sent: 10/08/2013   5:03 PM           To: Pecola Lawless, MD                   ------

## 2013-10-13 MED ORDER — MEMANTINE HCL ER 28 MG PO CP24: 28.0000 mg | ORAL_CAPSULE | Freq: Every day | ORAL | Status: DC

## 2013-10-13 NOTE — Telephone Encounter (Signed)
NEEDS NEW RX FOR NAMENDA XR 28MG -[facility] [address]--HAS 3 PILLS LEFT

## 2013-10-13 NOTE — Telephone Encounter (Signed)
This message was not forwarded until 12/08.   Rx has been sent

## 2013-11-03 ENCOUNTER — Ambulatory Visit: Payer: Medicare Other | Admitting: Nurse Practitioner

## 2013-11-03 ENCOUNTER — Telehealth: Payer: Self-pay | Admitting: *Deleted

## 2013-11-03 NOTE — Telephone Encounter (Signed)
Patients wife called and stated that patient has been vomiting throughout the day today. She stated she would call back tomorrow if patient is still vomiting. She was advised to take patient to urgent care or ER if his symptoms persist. She agreed. JG//CMA

## 2013-11-04 ENCOUNTER — Ambulatory Visit: Payer: Medicare Other | Admitting: Nurse Practitioner

## 2013-11-05 ENCOUNTER — Other Ambulatory Visit: Payer: Self-pay | Admitting: Internal Medicine

## 2013-11-07 NOTE — Telephone Encounter (Signed)
Warfarin refilled per protocol. JG//CMA 

## 2013-11-11 ENCOUNTER — Ambulatory Visit: Payer: Commercial Managed Care - HMO

## 2013-11-11 DIAGNOSIS — Z79899 Other long term (current) drug therapy: Secondary | ICD-10-CM

## 2013-11-11 LAB — PROTIME-INR
INR: 4.4 ratio — AB (ref 0.8–1.0)
Prothrombin Time: 45.7 s — ABNORMAL HIGH (ref 10.2–12.4)

## 2013-11-12 ENCOUNTER — Telehealth: Payer: Self-pay | Admitting: *Deleted

## 2013-11-12 NOTE — Telephone Encounter (Signed)
Called and spoke with patient's wife concerning change in Warfarin dose. She verbalized understanding.  She also stated that patient's back pain is getting worse. She stated that they have an appointment with pain management next Wednesday. JG//CMA

## 2013-11-12 NOTE — Telephone Encounter (Signed)
Warfarin 2.5 mg today & 1/8 & 1/9 ( X 3 days) then start 2.5 mg M , W ,& Fri  And 5 mg all other days. PT/INR in 3 weeks

## 2013-11-12 NOTE — Telephone Encounter (Signed)
Called and spoke with patient's wife. She stated that he takes one 2.5 mg tablet on Monday and Friday and one 5 mg tablet all other days. She stated that he had the flu last week and has been taking his pain medication more frequently for his back.

## 2013-11-12 NOTE — Telephone Encounter (Signed)
Message copied by Elveria RoyalsGLOVER, Joey Lierman A on Wed Nov 12, 2013  8:50 AM ------      Message from: Pecola LawlessHOPPER, WILLIAM F      Created: Tue Nov 11, 2013  6:00 PM       Verify warfarin dose; it needs to be decreased      ----- Message -----         From: [facility] Interface         Sent: 11/11/2013   5:13 PM           To: Pecola LawlessWilliam F Hopper, MD                   ------

## 2013-11-19 ENCOUNTER — Telehealth: Payer: Self-pay | Admitting: *Deleted

## 2013-11-19 NOTE — Telephone Encounter (Signed)
[facility] called today and stated that patient was seen in their office today for back pain. They requested that a referral be faxed to them at [phone] for [facility].

## 2013-11-20 NOTE — Telephone Encounter (Signed)
Referral faxed to [facility].

## 2013-12-04 ENCOUNTER — Other Ambulatory Visit: Payer: Self-pay | Admitting: *Deleted

## 2013-12-04 ENCOUNTER — Other Ambulatory Visit: Payer: Self-pay

## 2013-12-04 MED ORDER — FOLTANX 3-35-2 MG PO TABS: 1.0000 | ORAL_TABLET | Freq: Two times a day (BID) | ORAL | Status: DC

## 2013-12-04 NOTE — Telephone Encounter (Signed)
Received refill request for patient. Dr. Sharen HonesGutierrez has never seen him, however he is transferring to Dr. Sharen HonesGutierrez from Dr. Alwyn RenHopper in February. Ok to refill meds until then to [facility]?

## 2013-12-05 MED ORDER — WARFARIN SODIUM 5 MG PO TABS: ORAL_TABLET | ORAL | Status: DC

## 2013-12-05 MED ORDER — METOPROLOL TARTRATE 25 MG PO TABS: ORAL_TABLET | ORAL | Status: DC

## 2013-12-05 NOTE — Telephone Encounter (Signed)
OK for refills.

## 2013-12-11 ENCOUNTER — Encounter (INDEPENDENT_AMBULATORY_CARE_PROVIDER_SITE_OTHER): Payer: Self-pay

## 2013-12-11 ENCOUNTER — Encounter: Payer: Self-pay | Admitting: Nurse Practitioner

## 2013-12-11 ENCOUNTER — Ambulatory Visit (INDEPENDENT_AMBULATORY_CARE_PROVIDER_SITE_OTHER): Payer: Medicare HMO | Admitting: Nurse Practitioner

## 2013-12-11 VITALS — BP 150/67 | HR 58 | Ht 66.0 in | Wt 284.0 lb

## 2013-12-11 DIAGNOSIS — I63219 Cerebral infarction due to unspecified occlusion or stenosis of unspecified vertebral arteries: Secondary | ICD-10-CM | POA: Insufficient documentation

## 2013-12-11 DIAGNOSIS — R413 Other amnesia: Secondary | ICD-10-CM

## 2013-12-11 MED ORDER — MEMANTINE HCL ER 28 MG PO CP24: 28.0000 mg | ORAL_CAPSULE | Freq: Every day | ORAL | Status: DC

## 2013-12-11 NOTE — Progress Notes (Signed)
[facility]  PATIENT: Logan Valdez. DOB: Feb 28, 1946   REASON FOR VISIT:  Followup for history of stroke in 2009 and mild memory loss  HISTORY OF PRESENT ILLNESS: Logan Valdez, 68 year old male returns for followup. Last seen in this office 07/24/2013 by Dr. [name]. At that time he had repeat carotid Doppler revealed 50-69% stenosis involving the left proximal ICA. He states he is doing well and still has mild short-term memory difficulties which appear to be unchanged. Patient was asked to transition to Namenda XR 28 mg at last visit and he is tolerating without difficulty. His memory is stable .He has no new neurologic complaints,  he returns for reevaluation.  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 He has risk factors of diabetes and hypertension obesity and hyperlipidemia. At the time of his stroke he was found to have a PFO. He elected not to have disclosed. He denies stroke or TIA symptoms since last seen. Memory is stable. He remains on Coumadin with minimal bruising. No behavior problems, sleeping well. He made on Namenda 10 mg twice a day.   REVIEW OF SYSTEMS: Full 14 system review of systems performed and notable only for those listed, all others are neg:  Constitutional: N/A  Cardiovascular: N/A  Ear/Nose/Throat: Hearing loss  Skin: N/A  Eyes: N/A  Respiratory: N/A  Gastroitestinal: N/A  Hematology/Lymphatic: N/A  Endocrine: N/A Musculoskeletal:N/A  Allergy/Immunology: N/A  Neurological: N/A Psychiatric: N/A   ALLERGIES: Allergies  Allergen Reactions  . Hydrochlorothiazide   . Indomethacin     syncope  . Pravastatin Sodium     REACTION: rash 11/09  . Lisinopril     REACTION: HA  . Micardis [Telmisartan]     REACTION: HA    HOME MEDICATIONS: Outpatient Prescriptions Prior to Visit  Medication Sig Dispense Refill  . gabapentin (NEURONTIN) 300 MG capsule Take 1 capsule (300 mg total) by  mouth 3 (three) times daily.  90 capsule  3  . HYDROcodone-acetaminophen (NORCO) 10-325 MG per tablet Take 1 tablet by mouth every 6 (six) hours as needed.        Marland Kitchen L-Methylfolate-B6-B12 (FOLTANX) 3-35-2 MG TABS Take 1 tablet by mouth 2 (two) times daily.  180 tablet  1  . memantine (NAMENDA) 10 MG tablet Take 1 tablet (10 mg total) by mouth 2 (two) times daily. Dr.Sethi  180 tablet  1  . Memantine HCl ER (NAMENDA XR) 28 MG CP24 Take 28 mg by mouth daily.  30 capsule  3  . metoprolol tartrate (LOPRESSOR) 25 MG tablet TAKE 1 TABLET BY MOUTH TWICE A DAY  180 tablet  1  . mometasone (ELOCON) 0.1 % ointment Apply topically 2 (two) times daily.  45 g  0  . warfarin (COUMADIN) 5 MG tablet TAKE 1 TABLET BY MOUTH DAILY EXCEPT TAKE1 & 1/2 TABLETS ON WEDNESDAYS  45 tablet  0  . warfarin (COUMADIN) 5 MG tablet TAKE 1 TABLET BY MOUTH ONCE DAY, EXCEPT ON WEDNESDAY TAKE 1.5  45 tablet  0   No facility-administered medications prior to visit.    PAST MEDICAL HISTORY: Past Medical History  Diagnosis Date  . Obesity   . Personal history of thrombophlebitis   . Personal history of venous thrombosis and embolism     + lupusanticoagulant  . Dysmetabolic syndrome X   . Gout, unspecified   . Unspecified sleep apnea   . Mixed hyperlipidemia   . Type II or unspecified type  diabetes mellitus without mention of complication, uncontrolled     PAST SURGICAL HISTORY: Past Surgical History  Procedure Laterality Date  . Wrist surgery      Right wrist repair post trauma  . Colonoscopy  2000    [facility]    FAMILY HISTORY: Family History  Problem Relation Age of Onset  . Emphysema Father   . Diabetes Brother   . Hypertension Brother   . Heart attack Brother 43  . Skin cancer Brother   . Lung cancer Brother   . Cancer Maternal Aunt     ? Type   . Stroke Neg Hx     SOCIAL HISTORY: History   Social History  . Marital Status: Married    Spouse Name: N/A    Number of Children: N/A  . Years of  Education: N/A   Occupational History  . Not on file.   Social History Main Topics  . Smoking status: Never Smoker   . Smokeless tobacco: Not on file  . Alcohol Use: No  . Drug Use: No  . Sexual Activity: Not on file   Other Topics Concern  . Not on file   Social History Narrative  . No narrative on file     PHYSICAL EXAM  Filed Vitals:   12/11/13 1355  BP: 150/67  Pulse: 58  Height: 5\' 6"  (1.676 m)  Weight: 284 lb (128.822 kg)   Body mass index is 45.86 kg/(m^2).  Generalized: Well developed, obese male in no acute distress  Head: normocephalic and atraumatic,. Oropharynx benign  Neck: Supple, no carotid bruits  Cardiac: Regular rate rhythm, no murmur  Musculoskeletal: No deformity   Neurological examination   Mentation: Alert oriented to time, place, history taking. MMSE 19/30 missing items in orientation, calculation 3/3 recall. AFT 12.  Follows all commands speech and language fluent  Cranial nerve II-XII: Pupils were equal round reactive to light extraocular movements were full, visual field were full on confrontational test. Facial sensation and strength were normal. Hard of hearing.  Uvula tongue midline. head turning and shoulder shrug were normal and symmetric.Tongue protrusion into cheek strength was normal. Motor: normal bulk and tone, full strength in the BUE, BLE, fine finger movements normal, no pronator drift. No focal weakness Sensory: normal and symmetric to light touch, pinprick, and  vibration  Coordination: finger-nose-finger, heel-to-shin bilaterally, no dysmetria Reflexes: 1+ upper lower and symmetric  Gait and Station: Rising up from seated position without assistance, normal stance,  moderate stride, good arm swing, smooth turning, able to perform tiptoe, and heel walking without difficulty. Tandem gait is steady  DIAGNOSTIC DATA (LABS, IMAGING, TESTING) -Reviewed carotid doppler study which revealed 50-69% stenosis involving the left proximal  ICA.   ASSESSMENT AND PLAN  68 y.o. year old male  has a past medical history of left posterior cerebral infarct in October 2009. No further stroke or TIA symptoms. Memory problems are stable on Namenda.   Continue Namenda ER 28mg  daily F/U in 6 months Nilda RiggsNancy Carolyn Martin, [facility]ty HospitalGNP, [facility]ningBC, APRN  [facility] [address], [address] [place], [place] [address] [phone]

## 2013-12-11 NOTE — Patient Instructions (Signed)
Continue Namenda ER 28mg  daily Memory score is stable F/U in 6 months

## 2013-12-16 ENCOUNTER — Ambulatory Visit (INDEPENDENT_AMBULATORY_CARE_PROVIDER_SITE_OTHER): Payer: Medicare HMO | Admitting: Family Medicine

## 2013-12-16 ENCOUNTER — Encounter: Payer: Self-pay | Admitting: Family Medicine

## 2013-12-16 VITALS — BP 140/78 | HR 55 | Temp 97.7°F | Ht 66.0 in | Wt 284.2 lb

## 2013-12-16 DIAGNOSIS — Q2111 Secundum atrial septal defect: Secondary | ICD-10-CM

## 2013-12-16 DIAGNOSIS — I10 Essential (primary) hypertension: Secondary | ICD-10-CM

## 2013-12-16 DIAGNOSIS — E782 Mixed hyperlipidemia: Secondary | ICD-10-CM

## 2013-12-16 DIAGNOSIS — IMO0002 Reserved for concepts with insufficient information to code with codable children: Secondary | ICD-10-CM

## 2013-12-16 DIAGNOSIS — Q211 Atrial septal defect: Secondary | ICD-10-CM

## 2013-12-16 DIAGNOSIS — E119 Type 2 diabetes mellitus without complications: Secondary | ICD-10-CM

## 2013-12-16 DIAGNOSIS — D6859 Other primary thrombophilia: Secondary | ICD-10-CM

## 2013-12-16 DIAGNOSIS — R413 Other amnesia: Secondary | ICD-10-CM

## 2013-12-16 LAB — CBC WITH DIFFERENTIAL/PLATELET
BASOS PCT: 0.3 % (ref 0.0–3.0)
Basophils Absolute: 0 10*3/uL (ref 0.0–0.1)
EOS PCT: 2.7 % (ref 0.0–5.0)
Eosinophils Absolute: 0.2 10*3/uL (ref 0.0–0.7)
HCT: 43.2 % (ref 39.0–52.0)
HEMOGLOBIN: 14 g/dL (ref 13.0–17.0)
LYMPHS ABS: 2 10*3/uL (ref 0.7–4.0)
Lymphocytes Relative: 22.1 % (ref 12.0–46.0)
MCHC: 32.5 g/dL (ref 30.0–36.0)
MCV: 92.2 fl (ref 78.0–100.0)
Monocytes Absolute: 0.8 10*3/uL (ref 0.1–1.0)
Monocytes Relative: 9.2 % (ref 3.0–12.0)
NEUTROS ABS: 6 10*3/uL (ref 1.4–7.7)
Neutrophils Relative %: 65.7 % (ref 43.0–77.0)
PLATELETS: 194 10*3/uL (ref 150.0–400.0)
RBC: 4.69 Mil/uL (ref 4.22–5.81)
RDW: 13.6 % (ref 11.5–14.6)
WBC: 9.1 10*3/uL (ref 4.5–10.5)

## 2013-12-16 LAB — COMPREHENSIVE METABOLIC PANEL
ALBUMIN: 3.8 g/dL (ref 3.5–5.2)
ALK PHOS: 54 U/L (ref 39–117)
ALT: 22 U/L (ref 0–53)
AST: 25 U/L (ref 0–37)
BUN: 12 mg/dL (ref 6–23)
CO2: 29 mEq/L (ref 19–32)
Calcium: 9.2 mg/dL (ref 8.4–10.5)
Chloride: 101 mEq/L (ref 96–112)
Creatinine, Ser: 0.9 mg/dL (ref 0.4–1.5)
GFR: 87.98 mL/min (ref >60.00)
Glucose, Bld: 98 mg/dL (ref 70–99)
POTASSIUM: 4.8 meq/L (ref 3.5–5.1)
SODIUM: 137 meq/L (ref 135–145)
Total Bilirubin: 0.9 mg/dL (ref 0.3–1.2)
Total Protein: 7 g/dL (ref 6.0–8.3)

## 2013-12-16 LAB — MICROALBUMIN / CREATININE URINE RATIO
CREATININE, U: 92.7 mg/dL
MICROALB UR: 0.8 mg/dL (ref 0.0–1.9)
Microalb Creat Ratio: 0.9 mg/g (ref 0.0–30.0)

## 2013-12-16 LAB — HEMOGLOBIN A1C: Hgb A1c MFr Bld: 6.5 % (ref 4.6–6.5)

## 2013-12-16 LAB — LIPID PANEL
Cholesterol: 214 mg/dL — ABNORMAL HIGH (ref 0–200)
HDL: 50.7 mg/dL (ref >39.00)
Total CHOL/HDL Ratio: 4
Triglycerides: 151 mg/dL — ABNORMAL HIGH (ref 0.0–149.0)
VLDL: 30.2 mg/dL (ref 0.0–40.0)

## 2013-12-16 LAB — PROTIME-INR
INR: 1.3 ratio — AB (ref 0.8–1.0)
PROTHROMBIN TIME: 13.6 s — AB (ref 10.2–12.4)

## 2013-12-16 LAB — LDL CHOLESTEROL, DIRECT: Direct LDL: 134.8 mg/dL

## 2013-12-16 NOTE — Assessment & Plan Note (Signed)
Check A1c today.  Check urine microalb.  Currently diet controlled.

## 2013-12-16 NOTE — Assessment & Plan Note (Addendum)
See above re discussion on pro/cons of procedure. Continue f/u with Dr. Ethelene Halamos for lower back pain. Continue gabapentin and hydrocodone.

## 2013-12-16 NOTE — Patient Instructions (Signed)
Pass by Blair's office to establish with our coumadin clinic.  If unable to check coumadin in her office today, pass by Lab for coumadin check today. Return at your convenience fasting for blood work and afterwards for wellness exam (in next 3-4 months). Check with Shirlee LimerickMarion about referrals [facility]. Good to meet you today, call us with quesitons.

## 2013-12-16 NOTE — Progress Notes (Signed)
BP 140/78  Pulse 55  Temp(Src) 97.7 F (36.5 C) (Oral)  Ht 5\' 6"  (1.676 m)  Wt 284 lb 4 oz (128.935 kg)  BMI 45.90 kg/m2  SpO2 95%   CC: transfer care  Subjective:    Patient ID: Logan FlankEdward C Quizon Jr., male    DOB: [birth year], 68 y.o.   MRN: [id number]  HPI: Logan Flankdward C Straley Jr. is a 68 y.o. male presenting on 12/16/2013 with Establish Care  Prior saw Dr. Alwyn RenHopper.  Pleasant 68 yo with h/o stroke 2009 with mild residual memory loss, hypercoagulable (lupus AC positive) on coumadin, chronic lower back pain with L lumbar radiculopathy from DDD (followed by Dr. Ethelene Halamos) presents today for transfer of care.  Lupus anticoagulant - on chronic coumadin.  goal INR was 2-3.  Due for coumadin check.  Currently on 5mg  Sun, Tues, Thurs, Sat and 2.5mg  on Mon, Wed, Fri.   Lab Results  Component Value Date   INR 4.4* 11/11/2013   INR 2.3* 10/08/2013   INR 2.3* 09/01/2013    Recently gabapentin 300mg  increased to tid to help control lower back pain.  Followed by Dr. Ethelene Halamos for lower back pain, on pain contract with them.  HTN - Compliant with current antihypertensive regimen of metoprolol 25mg  twice daily.  Does not check blood pressures at home:but has cuff.  No low blood pressure symptoms of dizziness/syncope.  Denies HA, vision changes, CP/tightness, SOB, leg swelling.   H/o CVA 2009 s/p residual memory loss - on namenda for this.  Followed by neuro.  Wt Readings from Last 3 Encounters:  12/16/13 284 lb 4 oz (128.935 kg)  12/11/13 284 lb (128.822 kg)  07/24/13 287 lb (130.182 kg)    Preventative: No recent CPE  Relevant past medical, surgical, family and social history reviewed and updated. Allergies and medications reviewed and updated. Current Outpatient Prescriptions on File Prior to Visit  Medication Sig  . gabapentin (NEURONTIN) 300 MG capsule Take 1 capsule (300 mg total) by mouth 3 (three) times daily.  Marland Kitchen. HYDROcodone-acetaminophen (NORCO) 10-325 MG per tablet Take 1 tablet by  mouth every 6 (six) hours as needed.    Marland Kitchen. L-Methylfolate-B6-B12 (FOLTANX) 3-35-2 MG TABS Take 1 tablet by mouth 2 (two) times daily.  . Memantine HCl ER (NAMENDA XR) 28 MG CP24 Take 28 mg by mouth daily.  . metoprolol tartrate (LOPRESSOR) 25 MG tablet TAKE 1 TABLET BY MOUTH TWICE A DAY  . mometasone (ELOCON) 0.1 % ointment Apply topically 2 (two) times daily.  . [DISCONTINUED] metoprolol succinate (TOPROL-XL) 100 MG 24 hr tablet 1/2 by mouth daily   No current facility-administered medications on file prior to visit.    Review of Systems Per HPI unless specifically indicated above    Objective:    BP 140/78  Pulse 55  Temp(Src) 97.7 F (36.5 C) (Oral)  Ht 5\' 6"  (1.676 m)  Wt 284 lb 4 oz (128.935 kg)  BMI 45.90 kg/m2  SpO2 95%  Physical Exam  Nursing note and vitals reviewed. Constitutional: He appears well-developed and well-nourished. No distress.  obese  HENT:  Mouth/Throat: Oropharynx is clear and moist. No oropharyngeal exudate.  Eyes: Conjunctivae and EOM are normal. Pupils are equal, round, and reactive to light. No scleral icterus.  Cardiovascular: Normal rate, regular rhythm, normal heart sounds and intact distal pulses.   No murmur heard. Pulmonary/Chest: Effort normal and breath sounds normal. No respiratory distress. He has no wheezes. He has no rales.  Musculoskeletal: He exhibits no edema.  No  midline spine tenderness or paraspinous mm tenderness Mild tenderness at R lumbar region.  Skin: Skin is warm and dry. No rash noted.   Results for orders placed in visit on 11/11/13  PROTIME-INR      Result Value Range   Prothrombin Time 45.7 (*) 10.2 - 12.4 sec   INR 4.4 (*) 0.8 - 1.0 ratio      Assessment & Plan:   Problem List Items Addressed This Visit   DIABETES MELLITUS, CONTROLLED     Check A1c today.  Check urine microalb.  Currently diet controlled.    Relevant Orders      Hemoglobin A1c      Microalbumin / creatinine urine ratio   HTN (hypertension)       Chronic, stable today. Continue metoprolol 25mg  bid.  HR today 55 - merits monitoring. Intolerant to several bp meds in past.    Relevant Medications      warfarin (COUMADIN) 5 MG tablet   HYPERLIPIDEMIA     Check FLP today along with other blood work Off statin.    Relevant Orders      Lipid panel      Comprehensive metabolic panel   LUMBAR RADICULOPATHY     See above re discussion on pro/cons of procedure. Continue f/u with Dr. Ethelene Hal for lower back pain. Continue gabapentin and hydrocodone.    Memory loss     Sounds like post-stroke sequelae. On namenda XR per neuro.  Continues to f/u with them. Pt continues to drive (short distances)    PATENT FORAMEN OVALE     Discussed with patient/wife.    Primary hypercoagulable state - Primary     Lupus anticoagulant positive plus patent foramen ovale in history of DVT/PE and stroke. Discussed he is at high risk for re clot if he stops coumadin so recommend against ESI/surgical procedure. I will see if we can establish today with our coumadin clinic and if not will draw INR level today as pt overdue and last INR 4.4.    Relevant Orders      CBC with Differential      Protime-INR       Follow up plan: Return in about 3 months (around 03/15/2014), or as needed, for medicare wellness.

## 2013-12-16 NOTE — Assessment & Plan Note (Signed)
Check FLP today along with other blood work Off statin.

## 2013-12-16 NOTE — Assessment & Plan Note (Signed)
Lupus anticoagulant positive plus patent foramen ovale in history of DVT/PE and stroke. Discussed he is at high risk for re clot if he stops coumadin so recommend against ESI/surgical procedure. I will see if we can establish today with our coumadin clinic and if not will draw INR level today as pt overdue and last INR 4.4.

## 2013-12-16 NOTE — Progress Notes (Signed)
Pre-visit discussion using our clinic review tool. No additional management support is needed unless otherwise documented below in the visit note.  

## 2013-12-16 NOTE — Assessment & Plan Note (Addendum)
Discussed with patient/ wife

## 2013-12-16 NOTE — Assessment & Plan Note (Signed)
Sounds like post-stroke sequelae. On namenda XR per neuro.  Continues to f/u with them. Pt continues to drive (short distances)

## 2013-12-16 NOTE — Assessment & Plan Note (Addendum)
Chronic, stable today. Continue metoprolol 25mg  bid.  HR today 55 - merits monitoring. Intolerant to several bp meds in past.

## 2013-12-17 ENCOUNTER — Telehealth: Payer: Self-pay | Admitting: Family Medicine

## 2013-12-17 ENCOUNTER — Ambulatory Visit (INDEPENDENT_AMBULATORY_CARE_PROVIDER_SITE_OTHER): Payer: Medicare HMO | Admitting: Family Medicine

## 2013-12-17 DIAGNOSIS — Z5181 Encounter for therapeutic drug level monitoring: Secondary | ICD-10-CM

## 2013-12-17 LAB — POCT INR: INR: 1.3

## 2013-12-17 NOTE — Telephone Encounter (Signed)
Relevant patient education mailed to patient.  

## 2013-12-22 ENCOUNTER — Encounter: Payer: Self-pay | Admitting: *Deleted

## 2013-12-25 ENCOUNTER — Ambulatory Visit: Payer: Medicare HMO

## 2013-12-25 ENCOUNTER — Telehealth: Payer: Self-pay | Admitting: Family Medicine

## 2013-12-25 NOTE — Telephone Encounter (Signed)
Pt missed appt to establish with Coumadin Clinic.  Left vm to return call.

## 2014-01-07 ENCOUNTER — Telehealth: Payer: Self-pay | Admitting: *Deleted

## 2014-01-07 NOTE — Telephone Encounter (Signed)
Pt is almost out of Foltanx.  Is not being manufactured due to license not signed.  May be back in supply April, what can she do until then?  [phone], or [phone]

## 2014-01-07 NOTE — Telephone Encounter (Signed)
May substitute Folgaard or [employer]ol[employer] instead

## 2014-01-08 NOTE — Telephone Encounter (Signed)
I called and spoke to Homero FellersFrank, Teacher, early years[facility]ac[facility]  Will change pt to folbic, wife made aware.

## 2014-01-12 ENCOUNTER — Ambulatory Visit (INDEPENDENT_AMBULATORY_CARE_PROVIDER_SITE_OTHER): Payer: Medicare HMO | Admitting: Family Medicine

## 2014-01-12 DIAGNOSIS — Z5181 Encounter for therapeutic drug level monitoring: Secondary | ICD-10-CM

## 2014-01-12 LAB — POCT INR: INR: 1.7

## 2014-01-16 ENCOUNTER — Telehealth: Payer: Self-pay | Admitting: *Deleted

## 2014-01-16 ENCOUNTER — Other Ambulatory Visit: Payer: Self-pay

## 2014-01-16 DIAGNOSIS — IMO0002 Reserved for concepts with insufficient information to code with codable children: Secondary | ICD-10-CM

## 2014-01-16 MED ORDER — GABAPENTIN 300 MG PO CAPS: 300.0000 mg | ORAL_CAPSULE | Freq: Three times a day (TID) | ORAL | Status: DC

## 2014-01-16 NOTE — Telephone Encounter (Signed)
Patient calling about medicine refills

## 2014-01-16 NOTE — Telephone Encounter (Signed)
pts wife notified gabapentin refill done.

## 2014-01-16 NOTE — Telephone Encounter (Signed)
Patient's wife is requesting refill of gabapentin-300mg  refill

## 2014-01-16 NOTE — Telephone Encounter (Signed)
Sent in

## 2014-01-16 NOTE — Telephone Encounter (Signed)
This med was like prescribed by Dr Alwyn RenHopper.  I called back.  Patient indicates Dr Alwyn RenHopper refilled the Rx.  Nothing is needed from us at this time.

## 2014-01-16 NOTE — Telephone Encounter (Signed)
pts wife left v/m that [facility] [address] has been trying to get refill on gabapentin but has been sending request to Dr [facility]; now pt is out of med and request cb that refill is done today.Please advise.

## 2014-01-26 ENCOUNTER — Ambulatory Visit (INDEPENDENT_AMBULATORY_CARE_PROVIDER_SITE_OTHER): Payer: Medicare HMO | Admitting: Family Medicine

## 2014-01-26 DIAGNOSIS — Z5181 Encounter for therapeutic drug level monitoring: Secondary | ICD-10-CM

## 2014-01-26 LAB — POCT INR: INR: 1.9

## 2014-02-04 ENCOUNTER — Other Ambulatory Visit: Payer: Self-pay | Admitting: Neurology

## 2014-02-04 ENCOUNTER — Other Ambulatory Visit: Payer: Self-pay | Admitting: Internal Medicine

## 2014-02-10 ENCOUNTER — Other Ambulatory Visit: Payer: Self-pay | Admitting: *Deleted

## 2014-02-10 MED ORDER — WARFARIN SODIUM 5 MG PO TABS: 5.0000 mg | ORAL_TABLET | Freq: Every day | ORAL | Status: DC

## 2014-02-16 ENCOUNTER — Ambulatory Visit (INDEPENDENT_AMBULATORY_CARE_PROVIDER_SITE_OTHER): Payer: Commercial Managed Care - HMO | Admitting: Family Medicine

## 2014-02-16 DIAGNOSIS — Z5181 Encounter for therapeutic drug level monitoring: Secondary | ICD-10-CM

## 2014-02-16 LAB — POCT INR: INR: 1.9

## 2014-03-19 ENCOUNTER — Ambulatory Visit (INDEPENDENT_AMBULATORY_CARE_PROVIDER_SITE_OTHER): Payer: Medicare HMO | Admitting: Family Medicine

## 2014-03-19 DIAGNOSIS — Z5181 Encounter for therapeutic drug level monitoring: Secondary | ICD-10-CM

## 2014-03-19 LAB — POCT INR: INR: 1.9

## 2014-04-16 ENCOUNTER — Ambulatory Visit (INDEPENDENT_AMBULATORY_CARE_PROVIDER_SITE_OTHER): Payer: Commercial Managed Care - HMO | Admitting: Family Medicine

## 2014-04-16 DIAGNOSIS — Z5181 Encounter for therapeutic drug level monitoring: Secondary | ICD-10-CM

## 2014-04-16 LAB — POCT INR: INR: 2.8

## 2014-05-14 ENCOUNTER — Ambulatory Visit (INDEPENDENT_AMBULATORY_CARE_PROVIDER_SITE_OTHER): Payer: Commercial Managed Care - HMO | Admitting: Family Medicine

## 2014-05-14 DIAGNOSIS — Z5181 Encounter for therapeutic drug level monitoring: Secondary | ICD-10-CM

## 2014-05-14 LAB — POCT INR: INR: 2.4

## 2014-05-18 ENCOUNTER — Telehealth: Payer: Self-pay | Admitting: *Deleted

## 2014-05-18 NOTE — Telephone Encounter (Signed)
Lm on pts vm requesting a call back to scheduled DIABETIC BUNDLE OV-needs BP check and LDL

## 2014-05-18 NOTE — Telephone Encounter (Signed)
Spoke to pt and appt sched for 06/19/2014

## 2014-06-10 ENCOUNTER — Encounter: Payer: Self-pay | Admitting: Nurse Practitioner

## 2014-06-10 ENCOUNTER — Ambulatory Visit (INDEPENDENT_AMBULATORY_CARE_PROVIDER_SITE_OTHER): Payer: Medicare HMO | Admitting: Nurse Practitioner

## 2014-06-10 VITALS — BP 131/61 | HR 69 | Ht 66.0 in | Wt 285.8 lb

## 2014-06-10 DIAGNOSIS — I63219 Cerebral infarction due to unspecified occlusion or stenosis of unspecified vertebral arteries: Secondary | ICD-10-CM

## 2014-06-10 DIAGNOSIS — R413 Other amnesia: Secondary | ICD-10-CM

## 2014-06-10 MED ORDER — MEMANTINE HCL ER 28 MG PO CP24: 28.0000 mg | ORAL_CAPSULE | Freq: Every day | ORAL | Status: DC

## 2014-06-10 NOTE — Progress Notes (Signed)
[facility]  PATIENT: Logan Logan. DOB: Nov 22, 1945   REASON FOR VISIT: follow up for hx of stroke and memory loss   HISTORY OF PRESENT ILLNESS:Mr. 51, 68 year old male returns for followup. Last seen in this office 2/5/ 2015 Carotid Doppler revealed 50-69% stenosis involving the left proximal ICA in September 2014. He states he is doing well and still has mild short-term memory difficulties which appear to be unchanged. Patient is currently taking  Namenda XR 28 mg  and he is tolerating without difficulty. His memory is stable .He has no new neurologic complaints, he returns for reevaluation. Due to chronic back pain he gets no  exercise. He has not had further stroke TIA symptoms. He remains on Coumadin with minimal bruising.  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 He has risk factors of diabetes and hypertension obesity and hyperlipidemia. At the time of his stroke he was found to have a PFO. He elected not to have disclosed. He denies stroke or TIA symptoms since last seen. Memory is stable. He remains on Coumadin with minimal bruising. No behavior problems, sleeping well. He made on Namenda 10 mg twice a day.      REVIEW OF SYSTEMS: Full 14 system review of systems performed and notable only for those listed, all others are neg:  Constitutional: N/A  Cardiovascular: N/A  Ear/Nose/Throat: N/A  Skin: N/A  Eyes: N/A  Respiratory: N/A  Gastroitestinal: N/A  Hematology/Lymphatic: N/A  Endocrine: N/A Musculoskeletal: Back pain Allergy/Immunology: N/A  Neurological: Memory loss  Psychiatric: N/A Sleep : NA   ALLERGIES: Allergies  Allergen Reactions  . Hydrochlorothiazide   . Indomethacin     syncope  . Pravastatin Sodium     REACTION: rash 11/09  . Lisinopril     REACTION: HA  . Micardis [Telmisartan]     REACTION: HA    HOME MEDICATIONS: Outpatient Prescriptions Prior to Visit    Medication Sig Dispense Refill  . gabapentin (NEURONTIN) 300 MG capsule Take 1 capsule (300 mg total) by mouth 3 (three) times daily.  90 capsule  11  . HYDROcodone-acetaminophen (NORCO) 10-325 MG per tablet Take 1 tablet by mouth every 6 (six) hours as needed.        Marland Kitchen L-Methylfolate-B6-B12 (FOLTANX) 3-35-2 MG TABS Take 1 tablet by mouth 2 (two) times daily.  180 tablet  1  . Memantine HCl ER (NAMENDA XR) 28 MG CP24 Take 28 mg by mouth daily.  30 capsule  6  . metoprolol tartrate (LOPRESSOR) 25 MG tablet TAKE 1 TABLET BY MOUTH TWICE A DAY  180 tablet  1  . warfarin (COUMADIN) 5 MG tablet Take 1 tablet (5 mg total) by mouth daily. Take 1 tablet by mouth daily except take 1/2 tablet on Mon/Wed/Fri  30 tablet  2  . folic acid-pyridoxine-cyancobalamin (FOLTX) 2.5-25-2 MG TABS Take 1 tablet by mouth 2 (two) times daily. Will start taking when out of foltanx (which is on backorder now).  01-08-14 ssy      . mometasone (ELOCON) 0.1 % ointment Apply topically 2 (two) times daily.  45 g  0  . NAMENDA XR 28 MG CP24 TAKE ONE CAPSULE BY MOUTH ONCE DAILY  30 capsule  6   No facility-administered medications prior to visit.    PAST MEDICAL HISTORY: Past Medical History  Diagnosis Date  . Obesity   . Dysmetabolic syndrome X   . Gout, unspecified   . Unspecified sleep  apnea   . Mixed hyperlipidemia   . Type II or unspecified type diabetes mellitus without mention of complication, uncontrolled   . History of pulmonary embolism   . History of DVT (deep vein thrombosis)     + lupusanticoagulant  . History of thrombophlebitis   . HTN (hypertension)   . History of stroke 2009    Sethi  . Lower back pain     Ramos  . PFO (patent foramen ovale)     PAST SURGICAL HISTORY: Past Surgical History  Procedure Laterality Date  . Wrist surgery      Right wrist repair post trauma  . Colonoscopy  2000    [facility]    FAMILY HISTORY: Family History  Problem Relation Age of Onset  . Emphysema Father    . Diabetes Brother   . Hypertension Brother   . Heart attack Brother 53  . Skin cancer Brother   . Lung cancer Brother   . Cancer Maternal Aunt     ? Type   . Stroke Neg Hx     SOCIAL HISTORY: History   Social History  . Marital Status: Married    Spouse Name: N/A    Number of Children: N/A  . Years of Education: N/A   Occupational History  . Not on file.   Social History Main Topics  . Smoking status: Never Smoker   . Smokeless tobacco: Not on file  . Alcohol Use: Not on file  . Drug Use: No  . Sexual Activity: Not on file   Other Topics Concern  . Not on file   Social History Narrative  . No narrative on file     PHYSICAL EXAM  Filed Vitals:   06/10/14 1319  BP: 131/61  Pulse: 69  Height: _0  (1.676 m)  Weight: 285 lb 12.8 oz (129.638 kg)   Body mass index is 46.15 kg/(m^2). Generalized: Well developed, obese male in no acute distress  Head: normocephalic and atraumatic,. Oropharynx benign  Neck: Supple, no carotid bruits  Cardiac: Regular rate rhythm, no murmur  Musculoskeletal: No deformity  Neurological examination  Mentation: Alert oriented to time, place, history taking. MMSE 19/30 missing items in orientation, calculation , unable to write a sentence or copy a figure. AFT 11. Follows all commands speech and language fluent  Cranial nerve II-XII: Pupils were equal round reactive to light extraocular movements were full, visual field were full on confrontational test. Facial sensation and strength were normal. Hard of hearing. Uvula tongue midline. head turning and shoulder shrug were normal and symmetric.Tongue protrusion into cheek strength was normal.  Motor: normal bulk and tone, full strength in the BUE, BLE, fine finger movements normal, no pronator drift. No focal weakness  Sensory: normal and symmetric to light touch, pinprick, and vibration  Coordination: finger-nose-finger, heel-to-shin bilaterally, no dysmetria  Reflexes: 1+ upper lower  and symmetric  Gait and Station: Rising up from seated position without assistance, normal stance, moderate stride, good arm swing, smooth turning, able to perform tiptoe, and heel walking without difficulty. Tandem gait is steady no assistive device     DIAGNOSTIC DATA (LABS, IMAGING, TESTING) - I reviewed patient records, labs, notes, testing and imaging myself where available.  Lab Results  Component Value Date   WBC 9.1 12/16/2013   HGB 14.0 12/16/2013   HCT 43.2 12/16/2013   MCV 92.2 12/16/2013   PLT 194.0 12/16/2013      Component Value Date/Time   NA 137 12/16/2013 1256  K 4.8 12/16/2013 1256   CL 101 12/16/2013 1256   CO2 29 12/16/2013 1256   GLUCOSE 98 12/16/2013 1256   BUN 12 12/16/2013 1256   CREATININE 0.9 12/16/2013 1256   CALCIUM 9.2 12/16/2013 1256   PROT 7.0 12/16/2013 1256   ALBUMIN 3.8 12/16/2013 1256   AST 25 12/16/2013 1256   ALT 22 12/16/2013 1256   ALKPHOS 54 12/16/2013 1256   BILITOT 0.9 12/16/2013 1256   GFRNONAA >60 08/26/2008 1607   GFRAA  Value: >60        The eGFR has been calculated using the MDRD equation. This calculation has not been validated in all clinical 08/26/2008 1607   Lab Results  Component Value Date   CHOL 214* 12/16/2013   HDL 50.70 12/16/2013   LDLCALC 105* 11/14/2010   LDLDIRECT 134.8 12/16/2013   TRIG 151.0* 12/16/2013   CHOLHDL 4 12/16/2013   Lab Results  Component Value Date   HGBA1C 6.5 12/16/2013    ASSESSMENT AND PLAN  68 y.o. year old male  has a past medical history of left posterior cerebral infarct in October 2009. Vascular risk factors of diabetes, hypertension, obesity, and hyperlipidemia. No further stroke or TIAs symptoms. Memory loss is stable on Namenda  Continue Namenda at current dose will refill Continue Coumadin for secondary stroke prevention Scheduled carotid Doppler for September 2015 Followup in 6 months, next visit with Dr. Lenis Dickinson Cecille Rubin, [facility], [facility], [facility] [address]  [place], [place] [address] [phone]

## 2014-06-10 NOTE — Patient Instructions (Signed)
Continue Namenda at current dose will refill Scheduled carotid Doppler for September 2015 Followup in 6 months, next visit with Dr. Pearlean BrownieSethi

## 2014-06-11 ENCOUNTER — Ambulatory Visit (INDEPENDENT_AMBULATORY_CARE_PROVIDER_SITE_OTHER): Payer: Commercial Managed Care - HMO | Admitting: Family Medicine

## 2014-06-11 ENCOUNTER — Other Ambulatory Visit: Payer: Self-pay | Admitting: Family Medicine

## 2014-06-11 DIAGNOSIS — Z5181 Encounter for therapeutic drug level monitoring: Secondary | ICD-10-CM

## 2014-06-11 LAB — POCT INR: INR: 2.5

## 2014-06-11 NOTE — Progress Notes (Signed)
I agree with the above plan 

## 2014-06-19 ENCOUNTER — Encounter: Payer: Self-pay | Admitting: Family Medicine

## 2014-06-19 ENCOUNTER — Ambulatory Visit (INDEPENDENT_AMBULATORY_CARE_PROVIDER_SITE_OTHER): Payer: Commercial Managed Care - HMO | Admitting: Family Medicine

## 2014-06-19 ENCOUNTER — Other Ambulatory Visit: Payer: Self-pay | Admitting: Family Medicine

## 2014-06-19 VITALS — BP 118/64 | HR 56 | Temp 98.0°F | Wt 284.5 lb

## 2014-06-19 DIAGNOSIS — E782 Mixed hyperlipidemia: Secondary | ICD-10-CM

## 2014-06-19 DIAGNOSIS — I10 Essential (primary) hypertension: Secondary | ICD-10-CM

## 2014-06-19 DIAGNOSIS — E119 Type 2 diabetes mellitus without complications: Secondary | ICD-10-CM

## 2014-06-19 LAB — HEMOGLOBIN A1C: Hgb A1c MFr Bld: 6.5 % (ref 4.6–6.5)

## 2014-06-19 LAB — LDL CHOLESTEROL, DIRECT: LDL DIRECT: 155.1 mg/dL

## 2014-06-19 MED ORDER — ATORVASTATIN CALCIUM 10 MG PO TABS: 10.0000 mg | ORAL_TABLET | Freq: Every day | ORAL | Status: DC

## 2014-06-19 MED ORDER — ATORVASTATIN CALCIUM 20 MG PO TABS: 20.0000 mg | ORAL_TABLET | Freq: Every day | ORAL | Status: DC

## 2014-06-19 NOTE — Assessment & Plan Note (Signed)
Well controlled. Check A1c today.

## 2014-06-19 NOTE — Assessment & Plan Note (Signed)
Intolerant to pravastatin in past - willing to try lipitor. Check LDL today.

## 2014-06-19 NOTE — Assessment & Plan Note (Signed)
Chronic, stable. Continue metoprolol 25mg bid. 

## 2014-06-19 NOTE — Progress Notes (Signed)
Pre visit review using our clinic review tool, if applicable. No additional management support is needed unless otherwise documented below in the visit note. 

## 2014-06-19 NOTE — Progress Notes (Signed)
BP 118/64  Pulse 56  Temp(Src) 98 F (36.7 C) (Oral)  Wt 284 lb 8 oz (129.048 kg)   CC: diabetic bundle  Subjective:    Patient ID: Logan FlankEdward C Roosevelt Jr., male    DOB: [birth year], 68 y.o.   MRN: [id number]  HPI: Logan Flankdward C Stooksbury Jr. is a 68 y.o. male presenting on 06/19/2014 for Follow-up   Pleasant 68 yo with h/o stroke 2009 with mild residual memory loss, hypercoagulable (lupus AC positive) on coumadin, chronic lower back pain with L lumbar radiculopathy from DDD (followed by Dr. Ethelene Halamos) presents today for diabetic bundle.  DM - diet controlled. Last a1c 6.5% (12/2013).  HTN - well controlled on current regimen of metoprolol 25mg  bid.  HLD - intolerant to pravastatin in past - unsure reaction.  Lab Results  Component Value Date   LDLCALC 105* 11/14/2010     Relevant past medical, surgical, family and social history reviewed and updated as indicated.  Allergies and medications reviewed and updated. Current Outpatient Prescriptions on File Prior to Visit  Medication Sig  . gabapentin (NEURONTIN) 300 MG capsule Take 1 capsule (300 mg total) by mouth 3 (three) times daily.  Marland Kitchen. HYDROcodone-acetaminophen (NORCO) 10-325 MG per tablet Take 1 tablet by mouth every 6 (six) hours as needed.    Marland Kitchen. L-Methylfolate-B6-B12 (FOLTANX) 3-35-2 MG TABS Take 1 tablet by mouth 2 (two) times daily.  . Memantine HCl ER (NAMENDA XR) 28 MG CP24 Take 28 mg by mouth daily.  . metoprolol tartrate (LOPRESSOR) 25 MG tablet TAKE 1 TABLET BY MOUTH TWICE A DAY  . warfarin (COUMADIN) 5 MG tablet Take as directed by Coumadin Clinic.  . [DISCONTINUED] metoprolol succinate (TOPROL-XL) 100 MG 24 hr tablet 1/2 by mouth daily   No current facility-administered medications on file prior to visit.    Review of Systems Per HPI unless specifically indicated above    Objective:    BP 118/64  Pulse 56  Temp(Src) 98 F (36.7 C) (Oral)  Wt 284 lb 8 oz (129.048 kg)  Physical Exam  Nursing note and vitals  reviewed. Constitutional: He appears well-developed and well-nourished. No distress.  HENT:  Mouth/Throat: Oropharynx is clear and moist. No oropharyngeal exudate.  Neck: No thyromegaly present.  Cardiovascular: Normal rate, regular rhythm and intact distal pulses.   Murmur (holosystolic) heard. Pulmonary/Chest: Effort normal and breath sounds normal. No respiratory distress. He has no wheezes. He has no rales.  Musculoskeletal: He exhibits no edema.  Skin: Skin is warm and dry. No rash noted.   Results for orders placed in visit on 06/11/14  POCT INR      Result Value Ref Range   INR 2.5        Assessment & Plan:   Problem List Items Addressed This Visit   DIABETES MELLITUS, CONTROLLED     Well controlled. Check A1c today.    Relevant Medications      atorvastatin (LIPITOR) tablet   Other Relevant Orders      Hemoglobin A1c   HYPERLIPIDEMIA - Primary     Intolerant to pravastatin in past - willing to try lipitor. Check LDL today.    Relevant Medications      atorvastatin (LIPITOR) tablet   Other Relevant Orders      LDL Cholesterol, Direct   HTN (hypertension)     Chronic, stable. Continue metoprolol 25mg  bid.    Relevant Medications      atorvastatin (LIPITOR) tablet       Follow up plan:  Return in about 6 months (around 12/20/2014), or as needed, for medicare wellness visit.

## 2014-06-19 NOTE — Patient Instructions (Signed)
I think your sugars are doing well, as well as blood pressure. Let's check cholesterol level today - if staying elevated I suggest trial of new cholesterol medicine called lipitor 10mg  daily (sent to pharmacy). Return to see me in 6 months for wellness visit

## 2014-06-22 ENCOUNTER — Telehealth: Payer: Self-pay | Admitting: Radiology

## 2014-07-15 ENCOUNTER — Ambulatory Visit (INDEPENDENT_AMBULATORY_CARE_PROVIDER_SITE_OTHER): Payer: Medicare HMO

## 2014-07-15 DIAGNOSIS — I63219 Cerebral infarction due to unspecified occlusion or stenosis of unspecified vertebral arteries: Secondary | ICD-10-CM

## 2014-07-20 ENCOUNTER — Encounter: Payer: Self-pay | Admitting: Family Medicine

## 2014-07-23 ENCOUNTER — Ambulatory Visit (INDEPENDENT_AMBULATORY_CARE_PROVIDER_SITE_OTHER): Payer: Commercial Managed Care - HMO | Admitting: Family Medicine

## 2014-07-23 DIAGNOSIS — Z5181 Encounter for therapeutic drug level monitoring: Secondary | ICD-10-CM

## 2014-07-23 LAB — POCT INR: INR: 3.1

## 2014-07-27 ENCOUNTER — Telehealth: Payer: Self-pay | Admitting: Nurse Practitioner

## 2014-07-27 NOTE — Telephone Encounter (Signed)
Carotid doppler results given to patient. Will repeat in 1 year.

## 2014-08-20 ENCOUNTER — Encounter: Payer: Self-pay | Admitting: Neurology

## 2014-08-20 ENCOUNTER — Telehealth: Payer: Self-pay | Admitting: Neurology

## 2014-08-20 NOTE — Telephone Encounter (Signed)
Left message for patient regarding moving appointment time on 12/09/14 per Dr Marlis EdelsonSethi's schedule. Printed and mailed letter with new appointment time.

## 2014-09-04 ENCOUNTER — Other Ambulatory Visit: Payer: Self-pay | Admitting: Neurology

## 2014-09-07 ENCOUNTER — Ambulatory Visit: Payer: Commercial Managed Care - HMO | Admitting: *Deleted

## 2014-09-07 ENCOUNTER — Other Ambulatory Visit (INDEPENDENT_AMBULATORY_CARE_PROVIDER_SITE_OTHER): Payer: Commercial Managed Care - HMO

## 2014-09-07 ENCOUNTER — Telehealth: Payer: Self-pay | Admitting: Nurse Practitioner

## 2014-09-07 ENCOUNTER — Ambulatory Visit (INDEPENDENT_AMBULATORY_CARE_PROVIDER_SITE_OTHER): Payer: Commercial Managed Care - HMO | Admitting: *Deleted

## 2014-09-07 DIAGNOSIS — Z23 Encounter for immunization: Secondary | ICD-10-CM

## 2014-09-07 DIAGNOSIS — Z86718 Personal history of other venous thrombosis and embolism: Secondary | ICD-10-CM

## 2014-09-07 DIAGNOSIS — Z5181 Encounter for therapeutic drug level monitoring: Secondary | ICD-10-CM

## 2014-09-07 LAB — PROTIME-INR
INR: 2.5 ratio — ABNORMAL HIGH (ref 0.8–1.0)
Prothrombin Time: 26.7 s — ABNORMAL HIGH (ref 9.6–13.1)

## 2014-09-07 NOTE — Telephone Encounter (Signed)
Patient spouse requesting samples of Memantine HCl ER (NAMENDA XR) 28 MG CP24.  Due to donut hole with Insurance, paying a significant amount out pocket.  Please call and advise.

## 2014-09-07 NOTE — Telephone Encounter (Signed)
I called back.  Provided number for patient assistance [phone].  They will contact PAP and call us back if needed.

## 2014-09-08 ENCOUNTER — Telehealth: Payer: Self-pay

## 2014-09-08 MED ORDER — MEMANTINE HCL 10 MG PO TABS: 10.0000 mg | ORAL_TABLET | Freq: Two times a day (BID) | ORAL | Status: DC

## 2014-09-08 NOTE — Telephone Encounter (Signed)
[name] called asking if [name] could possibly change the patient from Namenda 28mg  XR daily to generic Namenda 10mg  BID.  This should be much less expensive for the patient.  I have previously provided info for PAP, however, they would prefer a drug change instead.  I will be happy to send Rx if permissible.  Please advise.  Thank you.

## 2014-09-08 NOTE — Telephone Encounter (Signed)
It can be changed Thanks

## 2014-09-08 NOTE — Telephone Encounter (Signed)
[facility] has [facility] per patient request.  I called back.  Got no answer.  Left message.

## 2014-09-09 ENCOUNTER — Other Ambulatory Visit: Payer: Self-pay | Admitting: *Deleted

## 2014-09-09 MED ORDER — WARFARIN SODIUM 5 MG PO TABS: ORAL_TABLET | ORAL | Status: DC

## 2014-09-23 ENCOUNTER — Encounter: Payer: Self-pay | Admitting: Neurology

## 2014-09-28 ENCOUNTER — Other Ambulatory Visit (INDEPENDENT_AMBULATORY_CARE_PROVIDER_SITE_OTHER): Payer: Commercial Managed Care - HMO

## 2014-09-28 ENCOUNTER — Other Ambulatory Visit: Payer: Self-pay | Admitting: Family Medicine

## 2014-09-28 DIAGNOSIS — Z5181 Encounter for therapeutic drug level monitoring: Secondary | ICD-10-CM

## 2014-09-28 LAB — PROTIME-INR
INR: 2.2 ratio — AB (ref 0.8–1.0)
Prothrombin Time: 24.3 s — ABNORMAL HIGH (ref 9.6–13.1)

## 2014-09-29 ENCOUNTER — Encounter: Payer: Self-pay | Admitting: Neurology

## 2014-10-26 ENCOUNTER — Ambulatory Visit (INDEPENDENT_AMBULATORY_CARE_PROVIDER_SITE_OTHER): Payer: Commercial Managed Care - HMO | Admitting: Family Medicine

## 2014-10-26 DIAGNOSIS — Z5181 Encounter for therapeutic drug level monitoring: Secondary | ICD-10-CM

## 2014-10-26 LAB — POCT INR: INR: 2.5

## 2014-11-04 ENCOUNTER — Other Ambulatory Visit: Payer: Self-pay

## 2014-11-04 MED ORDER — FOLTANX 3-35-2 MG PO TABS: 1.0000 | ORAL_TABLET | Freq: Two times a day (BID) | ORAL | Status: DC

## 2014-12-01 ENCOUNTER — Other Ambulatory Visit: Payer: Self-pay | Admitting: Family Medicine

## 2014-12-07 ENCOUNTER — Other Ambulatory Visit (INDEPENDENT_AMBULATORY_CARE_PROVIDER_SITE_OTHER): Payer: PPO

## 2014-12-07 DIAGNOSIS — Z5181 Encounter for therapeutic drug level monitoring: Secondary | ICD-10-CM

## 2014-12-07 LAB — POCT INR: INR: 2.6

## 2014-12-09 ENCOUNTER — Ambulatory Visit (INDEPENDENT_AMBULATORY_CARE_PROVIDER_SITE_OTHER): Payer: Self-pay | Admitting: Neurology

## 2014-12-09 ENCOUNTER — Encounter: Payer: Self-pay | Admitting: Neurology

## 2014-12-09 VITALS — BP 159/73 | HR 65 | Ht 66.0 in | Wt 289.4 lb

## 2014-12-09 DIAGNOSIS — F0390 Unspecified dementia without behavioral disturbance: Secondary | ICD-10-CM

## 2014-12-09 DIAGNOSIS — F0150 Vascular dementia without behavioral disturbance: Secondary | ICD-10-CM | POA: Insufficient documentation

## 2014-12-09 MED ORDER — MEMANTINE HCL ER 28 MG PO CP24: 28.0000 mg | ORAL_CAPSULE | Freq: Every day | ORAL | Status: DC

## 2014-12-09 NOTE — Patient Instructions (Signed)
I had a long discussion with the patient and his wife regarding his dementia which appears stable on Namenda however patient was unable to  afford Namenda XR in the past but he has recently changed his insurance and it may be worthwhile trying again to see if it is covered. Change to Namenda XR 28 mg daily if patient can afford otherwise stay on 10 mg twice daily. Continue warfarin for stroke prevention given history of pulmonary embolism maintain strict control of diabetes with hemoglobin A1c goal below 6.5, lipids with LDL cholesterol goal below 70 mg percent and hypertension with blood pressure goal below 130/90. I also advised the patient to diet, eat healthy food, exercise regularly and lose weight. I encouraged him to join the [facility] and exercise within his tolerance capacity. Return for follow-up in 6 months or call earlier if necessary

## 2014-12-09 NOTE — Progress Notes (Signed)
[facility]  PATIENT: Logan Logan. 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 He has risk factors of diabetes and hypertension obesity and hyperlipidemia. At the time of his stroke he was found to have a PFO. He elected not to have disclosed.  Update 06/10/2014:Mr. 44, 69 year old male returns for followup. Last seen in this office 2/5/ 2015 Carotid Doppler revealed 50-69% stenosis involving the left proximal ICA in September 2014. He states he is doing well and still has mild short-term memory difficulties which appear to be unchanged. Patient is currently taking  Namenda XR 28 mg  and he is tolerating without difficulty. His memory is stable .He has no new neurologic complaints, he returns for reevaluation. Due to chronic back pain he gets no  exercise. He has not had further stroke TIA symptoms. He remains on Coumadin with minimal bruising.    Update 12/09/2014 : He returns for follow-up today complaint by his wife. The patient was unable to afford Namenda etc. 28 mg and hence switched back to Namenda 10 mg twice daily. He is tolerating it well but wife feels subjectively maybe a little bit worse. He continues to have memory and cognitive difficulties but under wife's supervision he does fine. He is mainly bothered by severe low back pain but has not been considered a surgical candidate. He has limited mobility and does not exercise a lot and in fact has gained some weight. He remains on warfarin which is tolerating well with only minor bruising and no significant bleeding episodes. He had carotid ultrasound done on 07/15/14 which I have personally reviewed the films and show 50-69% left ICA stenosis. He has had no recurrent stroke or TIA symptoms. He is tolerating Lipitor well without any side effects. He is not sure when he had his last lipid profile  checked   REVIEW OF SYSTEMS: Full 14 system review of systems performed and notable only for those listed, all others are neg: Memory loss, hearing loss, walking difficulty, confusion, moles    ALLERGIES: Allergies  Allergen Reactions  . Hydrochlorothiazide   . Indomethacin     syncope  . Pravastatin Sodium     REACTION: rash 11/09  . Lisinopril     REACTION: HA  . Micardis [Telmisartan]     REACTION: HA    HOME MEDICATIONS: Outpatient Prescriptions Prior to Visit  Medication Sig Dispense Refill  . atorvastatin (LIPITOR) 20 MG tablet Take 1 tablet (20 mg total) by mouth daily. 30 tablet 6  . gabapentin (NEURONTIN) 300 MG capsule Take 1 capsule (300 mg total) by mouth 3 (three) times daily. 90 capsule 11  . HYDROcodone-acetaminophen (NORCO) 10-325 MG per tablet Take 1 tablet by mouth every 6 (six) hours as needed.      Marland Kitchen L-Methylfolate-B6-B12 (FOLTANX) 3-35-2 MG TABS Take 1 tablet by mouth 2 (two) times daily. 180 tablet 0  . metoprolol tartrate (LOPRESSOR) 25 MG tablet TAKE 1 TABLET BY MOUTH TWICE A DAY 180 tablet 1  . warfarin (COUMADIN) 5 MG tablet TAKE AS DIRECTED BY COUMADIN CLINIC 30 tablet 11  . memantine (NAMENDA) 10 MG tablet Take 1 tablet (10 mg total) by mouth 2 (two) times daily. 60 tablet 6   No facility-administered medications prior to visit.    PAST MEDICAL HISTORY: Past Medical History  Diagnosis Date  . Obesity   .  Dysmetabolic syndrome X   . Gout, unspecified   . Unspecified sleep apnea   . Mixed hyperlipidemia   . Type II or unspecified type diabetes mellitus without mention of complication, uncontrolled   . History of pulmonary embolism   . History of DVT (deep vein thrombosis)     + lupusanticoagulant  . History of thrombophlebitis   . HTN (hypertension)   . History of stroke 2009    Sethi  . PFO (patent foramen ovale)   . Chronic lower back pain     Ramos  . DDD (degenerative disc disease), lumbar     PAST SURGICAL HISTORY: Past Surgical  History  Procedure Laterality Date  . Wrist surgery      Right wrist repair post trauma  . Colonoscopy  2000    [facility]    FAMILY HISTORY: Family History  Problem Relation Age of Onset  . Emphysema Father   . Diabetes Brother   . Hypertension Brother   . Heart attack Brother 51  . Skin cancer Brother   . Lung cancer Brother   . Cancer Maternal Aunt     ? Type   . Stroke Neg Hx     SOCIAL HISTORY: History   Social History  . Marital Status: Married    Spouse Name: N/A    Number of Children: 4  . Years of Education: 8 th   Occupational History  . Not on file.   Social History Main Topics  . Smoking status: Never Smoker   . Smokeless tobacco: Never Used  . Alcohol Use: No  . Drug Use: No  . Sexual Activity: Not on file   Other Topics Concern  . Not on file   Social History Narrative   Patient is married with 4 children.   Patient is right handed.   Patient has 8 th grade education.   Patient drinks 4-5 cups daily.     PHYSICAL EXAM  Filed Vitals:   12/09/14 1011  BP: 159/73  Pulse: 65  Height: $Remove'5\' 6"'SptHnUI$  (1.676 m)  Weight: 289 lb 6.4 oz (131.271 kg)   Body mass index is 46.73 kg/(m^2). Generalized: Well developed, obese male in no acute distress  Head: normocephalic and atraumatic,. Oropharynx benign  Neck: Supple, no carotid bruits  Cardiac: Regular rate rhythm, no murmur  Musculoskeletal: No deformity  Neurological examination  Mentation: Alert oriented to time, place, history taking. MMSE 16/30 missing items in orientation, calculation , unable to write a sentence or copy a figure. AFT 11. Clock drawing 2/4. Geriatric depression scale 5 only. Follows all commands speech and language fluent  Cranial nerve II-XII: Pupils were equal round reactive to light extraocular movements were full, visual field show partial right homonymous hemianopsia on confrontational test. Facial sensation and strength were normal. Hard of hearing. Uvula tongue midline. head  turning and shoulder shrug were normal and symmetric.Tongue protrusion into cheek strength was normal.  Motor: normal bulk and tone, full strength in the BUE, BLE, fine finger movements normal, no pronator drift. No focal weakness  Sensory: normal and symmetric to light touch, pinprick, and vibration  Coordination: finger-nose-finger, heel-to-shin bilaterally, no dysmetria  Reflexes: 1+ upper lower and symmetric  Gait and Station: Rising up from seated position without assistance, normal stance, moderate stride, good arm swing, smooth turning, able to perform tiptoe, and heel walking without difficulty. Tandem gait is steady no assistive device     DIAGNOSTIC DATA (LABS, IMAGING, TESTING) - I reviewed patient records, labs,  notes, testing and imaging myself where available.  Lab Results  Component Value Date   WBC 9.1 12/16/2013   HGB 14.0 12/16/2013   HCT 43.2 12/16/2013   MCV 92.2 12/16/2013   PLT 194.0 12/16/2013      Component Value Date/Time   NA 137 12/16/2013 1256   K 4.8 12/16/2013 1256   CL 101 12/16/2013 1256   CO2 29 12/16/2013 1256   GLUCOSE 98 12/16/2013 1256   BUN 12 12/16/2013 1256   CREATININE 0.9 12/16/2013 1256   CALCIUM 9.2 12/16/2013 1256   PROT 7.0 12/16/2013 1256   ALBUMIN 3.8 12/16/2013 1256   AST 25 12/16/2013 1256   ALT 22 12/16/2013 1256   ALKPHOS 54 12/16/2013 1256   BILITOT 0.9 12/16/2013 1256   GFRNONAA >60 08/26/2008 1607   GFRAA  08/26/2008 1607    >60        The eGFR has been calculated using the MDRD equation. This calculation has not been validated in all clinical   Lab Results  Component Value Date   CHOL 214* 12/16/2013   HDL 50.70 12/16/2013   LDLCALC 105* 11/14/2010   LDLDIRECT 155.1 06/19/2014   TRIG 151.0* 12/16/2013   CHOLHDL 4 12/16/2013   Lab Results  Component Value Date   HGBA1C 6.5 06/19/2014    ASSESSMENT AND PLAN  69 y.o. year old male  has a past medical history of left posterior cerebral infarct in October  2009. Vascular risk factors of diabetes, hypertension, obesity, and hyperlipidemia. No further stroke or TIAs symptoms. 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 Change to Namenda XR 28 mg daily if patient can afford otherwise stay on 10 mg twice daily. Continue warfarin for stroke prevention given history of pulmonary embolism maintain strict control of diabetes with hemoglobin A1c goal below 6.5, lipids with LDL cholesterol goal below 70 mg percent and hypertension with blood pressure goal below 130/90. I also advised the patient to diet, eat healthy food, exercise regularly and lose weight. I encouraged him to join the [facility] and exercise within his tolerance capacity. Return for follow-up in 6 months or call earlier if necessary  Antony Contras, MD [facility] [address] [place], [place] [address] [phone]

## 2014-12-13 ENCOUNTER — Other Ambulatory Visit: Payer: Self-pay | Admitting: Family Medicine

## 2014-12-13 DIAGNOSIS — F0150 Vascular dementia without behavioral disturbance: Secondary | ICD-10-CM

## 2014-12-13 DIAGNOSIS — E119 Type 2 diabetes mellitus without complications: Secondary | ICD-10-CM

## 2014-12-13 DIAGNOSIS — E669 Obesity, unspecified: Secondary | ICD-10-CM

## 2014-12-13 DIAGNOSIS — E782 Mixed hyperlipidemia: Secondary | ICD-10-CM

## 2014-12-13 DIAGNOSIS — I10 Essential (primary) hypertension: Secondary | ICD-10-CM

## 2014-12-13 DIAGNOSIS — M109 Gout, unspecified: Secondary | ICD-10-CM

## 2014-12-13 DIAGNOSIS — Z1159 Encounter for screening for other viral diseases: Secondary | ICD-10-CM

## 2014-12-13 DIAGNOSIS — E538 Deficiency of other specified B group vitamins: Secondary | ICD-10-CM | POA: Insufficient documentation

## 2014-12-15 ENCOUNTER — Other Ambulatory Visit (INDEPENDENT_AMBULATORY_CARE_PROVIDER_SITE_OTHER): Payer: PPO

## 2014-12-15 DIAGNOSIS — I10 Essential (primary) hypertension: Secondary | ICD-10-CM

## 2014-12-15 DIAGNOSIS — E669 Obesity, unspecified: Secondary | ICD-10-CM

## 2014-12-15 DIAGNOSIS — E538 Deficiency of other specified B group vitamins: Secondary | ICD-10-CM

## 2014-12-15 DIAGNOSIS — E119 Type 2 diabetes mellitus without complications: Secondary | ICD-10-CM

## 2014-12-15 DIAGNOSIS — E782 Mixed hyperlipidemia: Secondary | ICD-10-CM

## 2014-12-15 DIAGNOSIS — M109 Gout, unspecified: Secondary | ICD-10-CM

## 2014-12-15 LAB — TSH: TSH: 2.03 u[IU]/mL (ref 0.35–4.50)

## 2014-12-15 LAB — HEPATIC FUNCTION PANEL
ALK PHOS: 55 U/L (ref 39–117)
ALT: 25 U/L (ref 0–53)
AST: 24 U/L (ref 0–37)
Albumin: 3.9 g/dL (ref 3.5–5.2)
BILIRUBIN DIRECT: 0.1 mg/dL (ref 0.0–0.3)
TOTAL PROTEIN: 6.8 g/dL (ref 6.0–8.3)
Total Bilirubin: 0.6 mg/dL (ref 0.2–1.2)

## 2014-12-15 LAB — VITAMIN B12: Vitamin B-12: >1500 pg/mL — ABNORMAL HIGH (ref 211–911)

## 2014-12-15 LAB — BASIC METABOLIC PANEL
BUN: 16 mg/dL (ref 6–23)
CHLORIDE: 103 meq/L (ref 96–112)
CO2: 28 meq/L (ref 19–32)
Calcium: 9.2 mg/dL (ref 8.4–10.5)
Creatinine, Ser: 0.82 mg/dL (ref 0.40–1.50)
GFR: 98.93 mL/min (ref >60.00)
GLUCOSE: 119 mg/dL — AB (ref 70–99)
POTASSIUM: 4.7 meq/L (ref 3.5–5.1)
Sodium: 138 mEq/L (ref 135–145)

## 2014-12-15 LAB — MICROALBUMIN / CREATININE URINE RATIO
Creatinine,U: 144.2 mg/dL
MICROALB UR: 1.7 mg/dL (ref 0.0–1.9)
Microalb Creat Ratio: 1.2 mg/g (ref 0.0–30.0)

## 2014-12-15 LAB — LIPID PANEL
CHOL/HDL RATIO: 3
Cholesterol: 152 mg/dL (ref 0–200)
HDL: 56.6 mg/dL (ref >39.00)
LDL Cholesterol: 73 mg/dL (ref 0–99)
NONHDL: 95.4
Triglycerides: 112 mg/dL (ref 0.0–149.0)
VLDL: 22.4 mg/dL (ref 0.0–40.0)

## 2014-12-15 LAB — HEMOGLOBIN A1C: Hgb A1c MFr Bld: 7 % — ABNORMAL HIGH (ref 4.6–6.5)

## 2014-12-15 LAB — URIC ACID: URIC ACID, SERUM: 6.2 mg/dL (ref 4.0–7.8)

## 2014-12-22 ENCOUNTER — Encounter: Payer: Self-pay | Admitting: Family Medicine

## 2014-12-22 ENCOUNTER — Ambulatory Visit (INDEPENDENT_AMBULATORY_CARE_PROVIDER_SITE_OTHER): Payer: PPO | Admitting: Family Medicine

## 2014-12-22 VITALS — BP 158/82 | HR 66 | Temp 97.8°F | Ht 66.75 in | Wt 285.5 lb

## 2014-12-22 DIAGNOSIS — I10 Essential (primary) hypertension: Secondary | ICD-10-CM

## 2014-12-22 DIAGNOSIS — E782 Mixed hyperlipidemia: Secondary | ICD-10-CM

## 2014-12-22 DIAGNOSIS — H9193 Unspecified hearing loss, bilateral: Secondary | ICD-10-CM

## 2014-12-22 DIAGNOSIS — Z1211 Encounter for screening for malignant neoplasm of colon: Secondary | ICD-10-CM

## 2014-12-22 DIAGNOSIS — F0150 Vascular dementia without behavioral disturbance: Secondary | ICD-10-CM

## 2014-12-22 DIAGNOSIS — E119 Type 2 diabetes mellitus without complications: Secondary | ICD-10-CM

## 2014-12-22 DIAGNOSIS — Z23 Encounter for immunization: Secondary | ICD-10-CM

## 2014-12-22 DIAGNOSIS — Z7189 Other specified counseling: Secondary | ICD-10-CM

## 2014-12-22 DIAGNOSIS — D6859 Other primary thrombophilia: Secondary | ICD-10-CM

## 2014-12-22 DIAGNOSIS — E538 Deficiency of other specified B group vitamins: Secondary | ICD-10-CM

## 2014-12-22 DIAGNOSIS — Z0000 Encounter for general adult medical examination without abnormal findings: Secondary | ICD-10-CM | POA: Insufficient documentation

## 2014-12-22 NOTE — Progress Notes (Signed)
Pre visit review using our clinic review tool, if applicable. No additional management support is needed unless otherwise documented below in the visit note. 

## 2014-12-22 NOTE — Assessment & Plan Note (Signed)
Elevated today. If remaining elevated next visit, add 2nd med.

## 2014-12-22 NOTE — Progress Notes (Signed)
BP 158/82 mmHg  Pulse 66  Temp(Src) 97.8 F (36.6 C) (Oral)  Ht 5' 6.75" (1.695 m)  Wt 285 lb 8 oz (129.502 kg)  BMI 45.08 kg/m2   CC: medicare wellness visit  Subjective:    Patient ID: [name]., male    DOB: Jun 12, 1946, 69 y.o.   MRN: [id number]  HPI: Logan Valdez. is a 69 y.o. 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 Nelva Bush) presents today for AMW.  Hearing screen - failed, prior worked with welders. Agrees to hearing eval.  Vision screen - failed R eye. Due for eye screen.  Fall risk screen - passed.  Depression screen - passed.   Preventative: COLONOSCOPY Date: 2000 [facility]. Discussed, rec stool kit. Prostate cancer screening - discussed with pt and wife, decide to postpone Flu shot - 09/2014 Tetanus shot - unsure Pneumonia shot - prevnar today. Shingles shot -  Advanced directive discussion - has at home, will bring [place] copy. HCPOA is wife Logan Valdez.   Patient is married with 4 children. Patient is right handed. Patient has 8 th grade education. Patient drinks 4-5 cups daily.  Relevant past medical, surgical, family and social history reviewed and updated as indicated. Interim medical history since our last visit reviewed. Allergies and medications reviewed and updated. Current Outpatient Prescriptions on File Prior to Visit  Medication Sig  . atorvastatin (LIPITOR) 20 MG tablet Take 1 tablet (20 mg total) by mouth daily.  Marland Kitchen gabapentin (NEURONTIN) 300 MG capsule Take 1 capsule (300 mg total) by mouth 3 (three) times daily.  Marland Kitchen HYDROcodone-acetaminophen (NORCO) 10-325 MG per tablet Take 1 tablet by mouth every 6 (six) hours as needed.    Marland Kitchen L-Methylfolate-B6-B12 (FOLTANX) 3-35-2 MG TABS Take 1 tablet by mouth 2 (two) times daily.  . memantine (NAMENDA  XR) 28 MG CP24 24 hr capsule Take 1 capsule (28 mg total) by mouth daily.  . metoprolol tartrate (LOPRESSOR) 25 MG tablet TAKE 1 TABLET BY MOUTH TWICE A DAY  . warfarin (COUMADIN) 5 MG tablet TAKE AS DIRECTED BY COUMADIN CLINIC  . [DISCONTINUED] metoprolol succinate (TOPROL-XL) 100 MG 24 hr tablet 1/2 by mouth daily   No current facility-administered medications on file prior to visit.    Review of Systems  Constitutional: Negative for fever, chills, appetite change and unexpected weight change.   Per HPI unless specifically indicated above     Objective:    BP 158/82 mmHg  Pulse 66  Temp(Src) 97.8 F (36.6 C) (Oral)  Ht 5' 6.75" (1.695 m)  Wt 285 lb 8 oz (129.502 kg)  BMI 45.08 kg/m2  Wt Readings from Last 3 Encounters:  12/22/14 285 lb 8 oz (129.502 kg)  12/09/14 289 lb 6.4 oz (131.271 kg)  06/19/14 284 lb 8 oz (129.048 kg)    Physical Exam  Constitutional: He is oriented to person, place, and time. He appears well-developed and well-nourished. No distress.  HENT:  Head: Normocephalic and atraumatic.  Right Ear: Hearing, tympanic membrane, external ear and ear canal normal.  Left Ear: Hearing, tympanic membrane, external ear and ear canal normal.  Nose: Nose normal.  Mouth/Throat: Uvula is midline, oropharynx is clear and moist and mucous membranes are normal. No oropharyngeal exudate, posterior oropharyngeal edema or posterior oropharyngeal erythema.  Eyes: Conjunctivae and EOM  are normal. Pupils are equal, round, and reactive to light. No scleral icterus.  Neck: Normal range of motion. Neck supple.  Cardiovascular: Normal rate, regular rhythm, normal heart sounds and intact distal pulses.   No murmur heard. Pulses:      Radial pulses are 2+ on the right side, and 2+ on the left side.  Pulmonary/Chest: Effort normal and breath sounds normal. No respiratory distress. He has no wheezes. He has no rales.  Abdominal: Soft. Bowel sounds are normal. He exhibits no distension  and no mass. There is no tenderness. There is no rebound and no guarding.  Musculoskeletal: Normal range of motion. He exhibits no edema.  Lymphadenopathy:    He has no cervical adenopathy.  Neurological: He is alert and oriented to person, place, and time.  CN grossly intact, station and gait intact Recall 1/3, 2/3 with cue Calculation - unable to do 2/2 education level and memory  Skin: Skin is warm and dry. No rash noted.  Psychiatric: He has a normal mood and affect. His behavior is normal. Judgment and thought content normal.  Nursing note and vitals reviewed.  Results for orders placed or performed in visit on 12/15/14  Lipid panel  Result Value Ref Range   Cholesterol 152 0 - 200 mg/dL   Triglycerides 112.0 0.0 - 149.0 mg/dL   HDL 56.60 >39.00 mg/dL   VLDL 22.4 0.0 - 40.0 mg/dL   LDL Cholesterol 73 0 - 99 mg/dL   Total CHOL/HDL Ratio 3    NonHDL 95.40   TSH  Result Value Ref Range   TSH 2.03 0.35 - 4.50 uIU/mL  Basic metabolic panel  Result Value Ref Range   Sodium 138 135 - 145 mEq/L   Potassium 4.7 3.5 - 5.1 mEq/L   Chloride 103 96 - 112 mEq/L   CO2 28 19 - 32 mEq/L   Glucose, Bld 119 (H) 70 - 99 mg/dL   BUN 16 6 - 23 mg/dL   Creatinine, Ser 0.82 0.40 - 1.50 mg/dL   Calcium 9.2 8.4 - 10.5 mg/dL   GFR 98.93 >60.00 mL/min  Vitamin B12  Result Value Ref Range   Vitamin B-12 >1500 (H) 211 - 911 pg/mL  Hemoglobin A1c  Result Value Ref Range   Hgb A1c MFr Bld 7.0 (H) 4.6 - 6.5 %  Microalbumin / creatinine urine ratio  Result Value Ref Range   Microalb, Ur 1.7 0.0 - 1.9 mg/dL   Creatinine,U 144.2 mg/dL   Microalb Creat Ratio 1.2 0.0 - 30.0 mg/g  Uric acid  Result Value Ref Range   Uric Acid, Serum 6.2 4.0 - 7.8 mg/dL  Hepatic function panel  Result Value Ref Range   Total Bilirubin 0.6 0.2 - 1.2 mg/dL   Bilirubin, Direct 0.1 0.0 - 0.3 mg/dL   Alkaline Phosphatase 55 39 - 117 U/L   AST 24 0 - 37 U/L   ALT 25 0 - 53 U/L   Total Protein 6.8 6.0 - 8.3 g/dL    Albumin 3.9 3.5 - 5.2 g/dL      Assessment & Plan:   Problem List Items Addressed This Visit    Vascular dementia    Recently refilled namenda XR - will price out this option. Impaired memory on testing again today. Continue f/u with Dr Leonie Man of neurology.      Primary hypercoagulable state    Continue warfarin.      Medicare annual wellness visit, initial - Primary    I have personally reviewed  the Medicare Annual Wellness questionnaire and have noted 1. The patient's medical and social history 2. Their use of alcohol, tobacco or illicit drugs 3. Their current medications and supplements 4. The patient's functional ability including ADL's, fall risks, home safety risks and hearing or visual impairment. 5. Diet and physical activity 6. Evidence for depression or mood disorders The patients weight, height, BMI have been recorded in the chart.  Hearing and vision has been addressed. I have made referrals, counseling and provided education to the patient based review of the above and I have provided the pt with a written personalized care plan for preventive services. Provider list updated - see scanned questionairre. Reviewed preventative protocols and updated unless pt declined.       HYPERLIPIDEMIA    Chronic, stable. Continue lipitor. Well tolerated.      HTN (hypertension)    Elevated today. If remaining elevated next visit, add 2nd med.      Diabetes type 2, controlled    Reviewed A1c trending up - encouraged stricter adherence to diabetic diet, encouraged start incorporating walking into routine.      B12 deficiency    Continue foltanx. B12 recently elevated - advised against taking extra vit B12.      Advanced care planning/counseling discussion    Advanced directive discussion - has at home, will bring [place] copy. HCPOA is wife Logan Valdez.        Other Visit Diagnoses    Special screening for malignant neoplasms, colon        Relevant Orders    Fecal occult blood,  imunochemical    Decreased hearing, bilateral        Relevant Orders    Ambulatory referral to Audiology        Follow up plan: Return in about 6 months (around 06/22/2015), or as needed, for follow up visit.

## 2014-12-22 NOTE — Assessment & Plan Note (Signed)

## 2014-12-22 NOTE — Assessment & Plan Note (Signed)
Advanced directive discussion - has at home, will bring us copy. HCPOA is wife Logan Valdez. 

## 2014-12-22 NOTE — Assessment & Plan Note (Signed)
Reviewed A1c trending up - encouraged stricter adherence to diabetic diet, encouraged start incorporating walking into routine.

## 2014-12-22 NOTE — Addendum Note (Signed)
Addended by: Josph MachoANCE, KIMBERLY A on: 12/22/2014 12:26 PM   Modules accepted: Orders

## 2014-12-22 NOTE — Assessment & Plan Note (Signed)
Continue foltanx. B12 recently elevated - advised against taking extra vit B12.

## 2014-12-22 NOTE — Assessment & Plan Note (Signed)
Chronic, stable. Continue lipitor. Well tolerated.

## 2014-12-22 NOTE — Assessment & Plan Note (Signed)
Recently refilled namenda XR - will price out this option. Impaired memory on testing again today. Continue f/u with Dr Pearlean BrownieSethi of neurology.

## 2014-12-22 NOTE — Patient Instructions (Addendum)
Prevnar today.  Pass by Marion's office for referral to audiology.  Bring [place] a copy of your living will. Pass by lab to pick up stool kit. Call your insurance about the shingles shot to see if it is covered or how much it would cost and where is cheaper (here or pharmacy).  If you want to receive here, call for nurse visit. Return in 6 months for diabetes follow up Good to see you today, call us with questions.

## 2014-12-22 NOTE — Assessment & Plan Note (Signed)
Continue warfarin.

## 2014-12-29 ENCOUNTER — Other Ambulatory Visit: Payer: Self-pay | Admitting: Family Medicine

## 2015-01-01 ENCOUNTER — Telehealth: Payer: Self-pay | Admitting: *Deleted

## 2015-01-01 ENCOUNTER — Other Ambulatory Visit: Payer: PPO

## 2015-01-01 DIAGNOSIS — Z1211 Encounter for screening for malignant neoplasm of colon: Secondary | ICD-10-CM

## 2015-01-01 LAB — FECAL OCCULT BLOOD, IMMUNOCHEMICAL: Fecal Occult Bld: POSITIVE — AB

## 2015-01-01 NOTE — Telephone Encounter (Signed)
Received call from [facility] with critical labs- Positive Ifob, results not entered in chart yet, but will be soon.

## 2015-01-02 ENCOUNTER — Other Ambulatory Visit: Payer: Self-pay | Admitting: Family Medicine

## 2015-01-02 DIAGNOSIS — R195 Other fecal abnormalities: Secondary | ICD-10-CM

## 2015-01-02 NOTE — Telephone Encounter (Signed)
Noted.  See result note.  

## 2015-01-13 ENCOUNTER — Encounter: Payer: Self-pay | Admitting: Gastroenterology

## 2015-01-13 ENCOUNTER — Ambulatory Visit (INDEPENDENT_AMBULATORY_CARE_PROVIDER_SITE_OTHER): Payer: PPO | Admitting: Gastroenterology

## 2015-01-13 VITALS — BP 128/76 | HR 68 | Ht 65.5 in | Wt 286.2 lb

## 2015-01-13 DIAGNOSIS — D6852 Prothrombin gene mutation: Secondary | ICD-10-CM | POA: Diagnosis not present

## 2015-01-13 DIAGNOSIS — R195 Other fecal abnormalities: Secondary | ICD-10-CM

## 2015-01-13 DIAGNOSIS — D6859 Other primary thrombophilia: Secondary | ICD-10-CM

## 2015-01-13 NOTE — Progress Notes (Signed)
01/13/2015 Logan Valdez [id number] 1946-05-10   HISTORY OF PRESENT ILLNESS:  This is a 69 year old male who apparently had a colonoscopy at [facility] over 10 years ago (? 16 years ago), however, we do not have those records.  He has PMH including but not limited to  hypercoagulable state with lupus AC +, PFO, and history of DVT, PE, and CVA as a result of those issues and requires life-long coumadin.  He is here today due to a recent positive IFOB test at his PCP's office during routine screening.  Patient says that he feels well and denies actually seeing any blood in his stools.  Has some constipation from the pain medication that he takes and does have to strain at times, but denies seeing any blood even when stools are difficult to pass.  His Hgb has been normal and was 14.0 grams in February.  His wife states that he has never been allowed to hold his coumadin for any other procedures and that he has not even been able to receive injections for his back pain.     Past Medical History  Diagnosis Date  . Obesity   . Dysmetabolic syndrome X   . Gout, unspecified   . Unspecified sleep apnea   . Mixed hyperlipidemia   . Type II or unspecified type diabetes mellitus without mention of complication, uncontrolled   . History of pulmonary embolism   . History of DVT (deep vein thrombosis)     + lupusanticoagulant  . History of thrombophlebitis   . HTN (hypertension)   . History of stroke 2009    Sethi  . PFO (patent foramen ovale)   . Chronic lower back pain     Ramos  . DDD (degenerative disc disease), lumbar    Past Surgical History  Procedure Laterality Date  . Wrist surgery      Right wrist repair post trauma  . Colonoscopy  2000    [facility]    reports that he has never smoked. He has never used smokeless tobacco. He reports that he does not drink alcohol or use illicit drugs. family history includes Cancer in his maternal aunt; Diabetes in his brother; Emphysema  in his father; Heart attack (age of onset: 55) in his brother; Hypertension in his brother; Lung cancer in his brother; Skin cancer in his brother. There is no history of Stroke. Allergies  Allergen Reactions  . Hydrochlorothiazide   . Indomethacin     syncope  . Pravastatin Sodium     REACTION: rash 11/09  . Lisinopril     REACTION: HA  . Micardis [Telmisartan]     REACTION: HA      Outpatient Encounter Prescriptions as of 01/13/2015  Medication Sig  . atorvastatin (LIPITOR) 20 MG tablet TAKE 1 TABLET BY MOUTH DAILY  . gabapentin (NEURONTIN) 300 MG capsule Take 1 capsule (300 mg total) by mouth 3 (three) times daily.  Marland Kitchen HYDROcodone-acetaminophen (NORCO) 10-325 MG per tablet Take 1 tablet by mouth every 6 (six) hours as needed.    Marland Kitchen L-Methylfolate-B6-B12 (FOLTANX) 3-35-2 MG TABS Take 1 tablet by mouth 2 (two) times daily.  . memantine (NAMENDA XR) 28 MG CP24 24 hr capsule Take 1 capsule (28 mg total) by mouth daily.  . metoprolol tartrate (LOPRESSOR) 25 MG tablet TAKE 1 TABLET BY MOUTH TWICE A DAY  . warfarin (COUMADIN) 5 MG tablet TAKE AS DIRECTED BY COUMADIN CLINIC     REVIEW OF SYSTEMS  :  All other systems reviewed and negative except where noted in the History of Present Illness.   PHYSICAL EXAM: BP 128/76 mmHg  Pulse 68  Ht 5' 5.5" (1.664 m)  Wt 286 lb 3.2 oz (129.819 kg)  BMI 46.88 kg/m2 General: Well developed white male in no acute distress Head: Normocephalic and atraumatic Eyes:  Sclerae anicteric, conjunctiva pink. Ears: Normal auditory acuity Lungs: Clear throughout to auscultation Heart: Regular rate and rhythm Abdomen: Soft, non-distended. No masses or hepatomegaly noted. Normal bowel sounds Musculoskeletal: Symmetrical with no gross deformities  Skin: No lesions on visible extremities Extremities: No edema  Neurological: Alert oriented x 4, grossly non-focal Psychological:  Alert and cooperative. Normal mood and affect  ASSESSMENT AND PLAN: -Heme  positive stool, positive IFOB:  This was found by PCP on routine screening.  Last colonoscopy was over 10 years ago.  Patient is on coumadin for hypercoagulable state with lupus AC +, PFO, and history of DVT, PE, and CVA so is very high risk to hold his anti-coagulation.  According to this wife, he has not been allowed to hold the coumadin even for back injections in the past.  He is not anemic and denies any significant GI complaints/alarm symptoms including dark or bloody stool.  I have discussed colonoscopy at length with the patient and his wife.  We also discussed other options including Cologuard, however, I informed them that a positive result would then would require further investigation with colonoscopy.  Also discussed the possibility of bridging to Lovenox, however, this option still leaves a window where he is at risk for thrombotic events.  After long discussion we collective made the decision to post-pone colonoscopy for now.  They will address and further discuss with Dr. Sharen HonesGutierrez at the next scheduled appointment.      CC:  Eustaquio BoydenGutierrez, Javier, MD

## 2015-01-13 NOTE — Patient Instructions (Signed)
Please follow up as needed 

## 2015-01-14 NOTE — Progress Notes (Signed)
I would be more inclined to do the colonoscopy while pt continues coumadin.

## 2015-01-15 NOTE — Progress Notes (Signed)
Just curious as to why you would do a colonoscopy while he is on coumadin.  If he has polyps and needs it repeated then he may not even be able to stop the coumadin to have the repeat performed.  I think that he is fairly high risk, especially to hold anti-coagulation.  Patient and his wife were not very interested and preferred to discuss further with his PCP regarding the anti-coagulation at their next visit.  Thank you,  Jess

## 2015-01-19 NOTE — Progress Notes (Signed)
Just FYI, I have been in touch with patient's PCP, Dr. Sharen HonesGutierrez, regarding colonoscopy vs other evaluation and waiting to hear back from him again.

## 2015-01-24 ENCOUNTER — Encounter: Payer: Self-pay | Admitting: Family Medicine

## 2015-01-24 DIAGNOSIS — H903 Sensorineural hearing loss, bilateral: Secondary | ICD-10-CM | POA: Insufficient documentation

## 2015-01-26 ENCOUNTER — Telehealth: Payer: Self-pay | Admitting: *Deleted

## 2015-01-26 NOTE — Telephone Encounter (Signed)
Spoke with patient's wife and scheduled OV on 02/10/15 at 10:00 AM.

## 2015-01-26 NOTE — Telephone Encounter (Signed)
-----   Message from Leta BaptistJessica D Zehr, PA-C sent at 01/26/2015 11:58 AM EDT ----- Please call patient and speak to his wife.  Let her know that I have been discussing the colonoscopy issue with patient's PCP, Dr. Sharen HonesGutierrez, as well as Dr. Arlyce DiceKaplan here.  The decision is to consider colonoscopy while he is on his anti-coagulation so I would like to see him back (non-urgently) to discuss scheduling this with them.  Thank you,  Jess  ----- Message -----    From: Eustaquio BoydenJavier Gutierrez, MD    Sent: 01/24/2015  12:31 PM      To: Princella PellegriniJessica D Zehr, PA-C  I would be more inclined for a diagnostic procedure (virtual or plain colonoscopy) mainly for information gathering and then if a therapeutic colonoscopy is required would discuss lovenox bridging or possible hospital admission for careful anticoagulation peri-procedurally. What do you think? I could see pt again and discuss above if you'd like. Thanks, Wynona CanesJavier  ----- Message -----    From: Leta BaptistJessica D Zehr, PA-C    Sent: 01/18/2015   1:52 PM      To: Eustaquio BoydenJavier Gutierrez, MD  Hello!  It was my pleasure.  Virtual colonoscopy is an option but obviously would only be detecting masses or large polyps, but not AVM's and other sources of bleeding.  If it did show polyps then we would potentially need to consider following that with colonoscopy, which puts us back at square one.  Also, Dr. Arlyce DiceKaplan mentioned doing colonoscopy while he remains on coumadin, but that would just be diagnostic to identify any bleeding source, but no therapeutic options would be performed, and once again polyps could not be removed, possibly necessitating repeat colonoscopy with anti-coagulation on hold/bridged.  Let me know your thoughts about those options.  Thank you,  Doug SouJessica Zehr   ----- Message -----    From: Eustaquio BoydenJavier Gutierrez, MD    Sent: 01/16/2015  10:24 AM      To: Leta BaptistJessica D Zehr, PA-C  Thanks for seeing my patient. i know his case is complicated given high risk off anticoagulation.  Would virtual colonoscopy vs plain CT be indicated in this case? i'm not too familiar with indications/contraindications to this.  Again, thanks! Eustaquio BoydenJavier Gutierrez

## 2015-02-03 ENCOUNTER — Other Ambulatory Visit (INDEPENDENT_AMBULATORY_CARE_PROVIDER_SITE_OTHER): Payer: PPO

## 2015-02-03 DIAGNOSIS — Z86718 Personal history of other venous thrombosis and embolism: Secondary | ICD-10-CM

## 2015-02-03 DIAGNOSIS — Z5181 Encounter for therapeutic drug level monitoring: Secondary | ICD-10-CM

## 2015-02-03 LAB — POCT INR: INR: 2.1

## 2015-02-06 ENCOUNTER — Other Ambulatory Visit: Payer: Self-pay

## 2015-02-06 MED ORDER — FOLTANX 3-35-2 MG PO TABS: 1.0000 | ORAL_TABLET | Freq: Two times a day (BID) | ORAL | Status: DC

## 2015-02-10 ENCOUNTER — Ambulatory Visit: Payer: PPO | Admitting: Gastroenterology

## 2015-02-10 ENCOUNTER — Other Ambulatory Visit: Payer: Self-pay | Admitting: Family Medicine

## 2015-03-08 ENCOUNTER — Telehealth: Payer: Self-pay | Admitting: Neurology

## 2015-03-08 MED ORDER — MEMANTINE HCL 10 MG PO TABS: 10.0000 mg | ORAL_TABLET | Freq: Two times a day (BID) | ORAL | Status: DC

## 2015-03-08 NOTE — Telephone Encounter (Signed)
Patients wife called and stated that Dr. Pearlean BrownieSethi said her husband could switch from Rx. memantine (NAMENDA XR) 28 MG CP24 24 hr capsule back to Rx. MEMANTINE [facility]CI (Generic). Please call and advise.

## 2015-03-08 NOTE — Telephone Encounter (Signed)
Last OV note says: Change to Namenda XR 28 mg daily if patient can afford otherwise stay on 10 mg twice daily. Rx has been changed.  I called back to advise.  They are aware.  She [facility] that [facility].  Rx has been sent.

## 2015-03-10 ENCOUNTER — Other Ambulatory Visit (INDEPENDENT_AMBULATORY_CARE_PROVIDER_SITE_OTHER): Payer: PPO

## 2015-03-10 DIAGNOSIS — Z86718 Personal history of other venous thrombosis and embolism: Secondary | ICD-10-CM | POA: Diagnosis not present

## 2015-03-10 DIAGNOSIS — Z5181 Encounter for therapeutic drug level monitoring: Secondary | ICD-10-CM | POA: Diagnosis not present

## 2015-03-10 LAB — POCT INR: INR: 2.6

## 2015-04-07 ENCOUNTER — Other Ambulatory Visit: Payer: PPO

## 2015-04-08 ENCOUNTER — Other Ambulatory Visit (INDEPENDENT_AMBULATORY_CARE_PROVIDER_SITE_OTHER): Payer: PPO

## 2015-04-08 DIAGNOSIS — Z5181 Encounter for therapeutic drug level monitoring: Secondary | ICD-10-CM

## 2015-04-08 DIAGNOSIS — Z86718 Personal history of other venous thrombosis and embolism: Secondary | ICD-10-CM

## 2015-04-08 LAB — POCT INR: INR: 3

## 2015-04-12 ENCOUNTER — Ambulatory Visit (INDEPENDENT_AMBULATORY_CARE_PROVIDER_SITE_OTHER): Payer: PPO | Admitting: Family Medicine

## 2015-04-12 ENCOUNTER — Encounter: Payer: Self-pay | Admitting: Family Medicine

## 2015-04-12 VITALS — BP 136/82 | HR 80 | Temp 98.1°F | Wt 279.0 lb

## 2015-04-12 DIAGNOSIS — D6859 Other primary thrombophilia: Secondary | ICD-10-CM

## 2015-04-12 DIAGNOSIS — T50904A Poisoning by unspecified drugs, medicaments and biological substances, undetermined, initial encounter: Secondary | ICD-10-CM

## 2015-04-12 DIAGNOSIS — K5903 Drug induced constipation: Secondary | ICD-10-CM

## 2015-04-12 DIAGNOSIS — K5909 Other constipation: Secondary | ICD-10-CM | POA: Diagnosis not present

## 2015-04-12 DIAGNOSIS — M5416 Radiculopathy, lumbar region: Secondary | ICD-10-CM | POA: Diagnosis not present

## 2015-04-12 DIAGNOSIS — D6852 Prothrombin gene mutation: Secondary | ICD-10-CM

## 2015-04-12 MED ORDER — POLYETHYLENE GLYCOL 3350 17 GM/SCOOP PO POWD: 17.0000 g | Freq: Every day | ORAL | Status: DC | PRN

## 2015-04-12 NOTE — Assessment & Plan Note (Addendum)
Worsening pain over last 3 weeks, correlating with worsening constipation, acutely worse over weekend after overexertion mowing lawn. Discussed both factors as precipitants of exacerbation of back pain. Will work towards constipation control, advised to avoid overexertion, and if not improved return for lumbar films and suggested he f/u with Dr Ethelene Halamos to discuss change in pain regimen. Discussed pros/cons of other spine intervention such as surgery and epidural steroid injections, discussed specifically risks of coming off coumadin, and risks of intervention with anticoagulant on board. If we are unable to control pain levels and they remain invested in advanced spine intervention, would update MRI and refer to hematology for assistance with anticoagulant bridging.

## 2015-04-12 NOTE — Assessment & Plan Note (Signed)
See above. Continue warfarin.

## 2015-04-12 NOTE — Progress Notes (Signed)
Pre visit review using our clinic review tool, if applicable. No additional management support is needed unless otherwise documented below in the visit note. 

## 2015-04-12 NOTE — Assessment & Plan Note (Signed)
Pt is on pain regimen. Discussed need for bowel regimen. Continue colace daily, add miralax 1 capful daily. Monitor for diarrhea or inadequate constipation control. Anticipate this is exacerbating radiculopathy.

## 2015-04-12 NOTE — Patient Instructions (Addendum)
Constipation + lawn mower has worsened pain.  Continue colace. Start miralax daily (1 capful daily in 8oz liquid), hold for diarrhea.  If this doesn't improve pain then return to see Dr Ethelene Halamos to discuss changing pain regimen. Good to see you - update me with effect of miralax.

## 2015-04-12 NOTE — Progress Notes (Signed)
BP 136/82 mmHg  Pulse 80  Temp(Src) 98.1 F (36.7 C) (Oral)  Wt 279 lb (126.554 kg)   CC: discuss back pain  Subjective:    Patient ID: Logan Flank., male    DOB: [birth year], 69 y.o.   MRN: [id number]  HPI: Logan Valdez. is a 69 y.o. male presenting on 04/12/2015 for Back Pain   Worsening back pain with L radiculopathy over last 3 weeks - pain starts in back and travels down posterior buttock and lateral leg to just below knee. Leaning forward when walking helps some. Trouble sleeping supine 2/2 pain - sleeps in recliners. Bad night Saturday after he overdid it mowing on Friday.   Worsening constipation over last 3 weeks because he has needed to increase narcotic use - up to 5 pills a day.  No fevers/chills, numbness/weakness, bowel/bladder incontinence.  Known chronic lumbar DDD with radiculopathy recently worsening. Known L4 compression fracture with L5/S1 foraminal narrowing by CT 2009. Followed by Dr Ethelene Hal. On gabapentin  tid and hydrocodone 10/325mg . Last seen 03/02/2015.   Lupus anticoagulant on chronic coumadin. Patent foramen ovale in PMHx. 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  Relevant past medical, surgical, family and social history reviewed and updated as indicated. Interim medical history since our last visit reviewed. Allergies and medications reviewed and updated. Current Outpatient Prescriptions on File Prior to Visit  Medication Sig  . atorvastatin (LIPITOR) 20 MG tablet TAKE 1 TABLET BY MOUTH DAILY  . gabapentin (NEURONTIN) 300 MG capsule TAKE ONE CAPSULE BY MOUTH THREE TIMES DAILY  . HYDROcodone-acetaminophen (NORCO) 10-325 MG per tablet Take 1 tablet by mouth every 6 (six) hours as needed.    Marland Kitchen L-Methylfolate-B6-B12 (FOLTANX) 3-35-2 MG TABS Take 1 tablet by mouth 2 (two) times daily.  . memantine (NAMENDA) 10 MG tablet Take 1 tablet (10 mg total) by mouth 2 (two) times daily.  . metoprolol tartrate  (LOPRESSOR) 25 MG tablet TAKE 1 TABLET BY MOUTH TWICE A DAY  . warfarin (COUMADIN) 5 MG tablet TAKE AS DIRECTED BY COUMADIN CLINIC  . [DISCONTINUED] metoprolol succinate (TOPROL-XL) 100 MG 24 hr tablet 1/2 by mouth daily   No current facility-administered medications on file prior to visit.    Review of Systems Per HPI unless specifically indicated above     Objective:    BP 136/82 mmHg  Pulse 80  Temp(Src) 98.1 F (36.7 C) (Oral)  Wt 279 lb (126.554 kg)  Wt Readings from Last 3 Encounters:  04/12/15 279 lb (126.554 kg)  01/13/15 286 lb 3.2 oz (129.819 kg)  12/22/14 285 lb 8 oz (129.502 kg)    Physical Exam  Constitutional: He appears well-developed and well-nourished. No distress.  Musculoskeletal: He exhibits edema (tr).  1+ PT and DP bilaterally  No pain midline spine No paraspinous mm tenderness Neg seated SLR bilaterally. No pain with int/ext rotation at hip.  Neurological:  Ambulates with hesitant gait and lumbar spine in flexion  Skin: Skin is warm and dry. No rash noted.  Psychiatric: He has a normal mood and affect.  Nursing note and vitals reviewed.  Results for orders placed or performed in visit on 04/08/15  POCT INR  Result Value Ref Range   INR 3.0       Assessment & Plan:   Problem List Items Addressed This Visit    Drug-induced constipation with proper administration    Pt is on pain regimen. Discussed need for bowel regimen. Continue colace  daily, add miralax 1 capful daily. Monitor for diarrhea or inadequate constipation control. Anticipate this is exacerbating radiculopathy.      Lumbar radiculopathy - Primary    Worsening pain over last 3 weeks, correlating with worsening constipation, acutely worse over weekend after overexertion mowing lawn. Discussed both factors as precipitants of exacerbation of back pain. Will work towards constipation control, advised to avoid overexertion, and if not improved return for lumbar films and suggested he f/u  with Dr Ethelene Halamos to discuss change in pain regimen. Discussed pros/cons of other spine intervention such as surgery and epidural steroid injections, discussed specifically risks of coming off coumadin, and risks of intervention with anticoagulant on board. If we are unable to control pain levels and they remain invested in advanced spine intervention, would update MRI and refer to hematology for assistance with anticoagulant bridging.      Primary hypercoagulable state    See above. Continue warfarin.          Follow up plan: Return if symptoms worsen or fail to improve.

## 2015-05-06 ENCOUNTER — Ambulatory Visit: Payer: PPO

## 2015-05-06 ENCOUNTER — Other Ambulatory Visit: Payer: PPO

## 2015-05-06 ENCOUNTER — Telehealth: Payer: Self-pay | Admitting: Family Medicine

## 2015-05-06 NOTE — Telephone Encounter (Signed)
Please call patient to reschedule.  Must be next week 7/7. 

## 2015-05-06 NOTE — Telephone Encounter (Signed)
Patient did not come in for their appointment today for pt inr.  Please let me know if patient needs to be contacted immediately for follow up or no follow up needed. °

## 2015-05-07 ENCOUNTER — Other Ambulatory Visit: Payer: PPO

## 2015-05-11 NOTE — Telephone Encounter (Signed)
Scheduled 7/7

## 2015-05-13 ENCOUNTER — Ambulatory Visit (INDEPENDENT_AMBULATORY_CARE_PROVIDER_SITE_OTHER): Payer: PPO | Admitting: *Deleted

## 2015-05-13 DIAGNOSIS — Z5181 Encounter for therapeutic drug level monitoring: Secondary | ICD-10-CM | POA: Diagnosis not present

## 2015-05-13 LAB — POCT INR: INR: 2.4

## 2015-05-13 NOTE — Progress Notes (Signed)
Pre visit review using our clinic review tool, if applicable. No additional management support is needed unless otherwise documented below in the visit note. 

## 2015-05-21 ENCOUNTER — Telehealth: Payer: Self-pay | Admitting: Nurse Practitioner

## 2015-05-21 NOTE — Telephone Encounter (Signed)
Pt needs refill on HYDROcodone-acetaminophen (NORCO) 10-325 MG per tablet may call pt at [phone]

## 2015-05-21 NOTE — Telephone Encounter (Signed)
Patient's wife called and advised to disregard the refill for HYDROcodone-acetaminophen (NORCO) 10-325 MG per tablet for the patient. She stated this was meant for another doctor's office and sorry for the inconvenience.  Thank you.

## 2015-06-09 ENCOUNTER — Ambulatory Visit: Payer: Self-pay | Admitting: Nurse Practitioner

## 2015-06-21 ENCOUNTER — Ambulatory Visit (INDEPENDENT_AMBULATORY_CARE_PROVIDER_SITE_OTHER): Payer: PPO | Admitting: *Deleted

## 2015-06-21 DIAGNOSIS — Z5181 Encounter for therapeutic drug level monitoring: Secondary | ICD-10-CM | POA: Diagnosis not present

## 2015-06-21 DIAGNOSIS — Z86718 Personal history of other venous thrombosis and embolism: Secondary | ICD-10-CM | POA: Diagnosis not present

## 2015-06-21 LAB — POCT INR: INR: 3.5

## 2015-06-21 NOTE — Progress Notes (Signed)
Pre visit review using our clinic review tool, if applicable. No additional management support is needed unless otherwise documented below in the visit note. 

## 2015-06-22 ENCOUNTER — Ambulatory Visit (INDEPENDENT_AMBULATORY_CARE_PROVIDER_SITE_OTHER): Payer: PPO | Admitting: Family Medicine

## 2015-06-22 ENCOUNTER — Encounter: Payer: Self-pay | Admitting: Family Medicine

## 2015-06-22 VITALS — BP 122/76 | HR 64 | Temp 98.6°F | Wt 277.5 lb

## 2015-06-22 DIAGNOSIS — R252 Cramp and spasm: Secondary | ICD-10-CM | POA: Diagnosis not present

## 2015-06-22 DIAGNOSIS — E782 Mixed hyperlipidemia: Secondary | ICD-10-CM

## 2015-06-22 DIAGNOSIS — I10 Essential (primary) hypertension: Secondary | ICD-10-CM | POA: Diagnosis not present

## 2015-06-22 DIAGNOSIS — E119 Type 2 diabetes mellitus without complications: Secondary | ICD-10-CM

## 2015-06-22 LAB — CK: Total CK: 57 U/L (ref 7–232)

## 2015-06-22 LAB — BASIC METABOLIC PANEL
BUN: 13 mg/dL (ref 6–23)
CALCIUM: 9 mg/dL (ref 8.4–10.5)
CO2: 29 mEq/L (ref 19–32)
CREATININE: 0.8 mg/dL (ref 0.40–1.50)
Chloride: 103 mEq/L (ref 96–112)
GFR: 101.63 mL/min (ref >60.00)
GLUCOSE: 132 mg/dL — AB (ref 70–99)
Potassium: 4.4 mEq/L (ref 3.5–5.1)
SODIUM: 138 meq/L (ref 135–145)

## 2015-06-22 LAB — HEMOGLOBIN A1C: Hgb A1c MFr Bld: 6.5 % (ref 4.6–6.5)

## 2015-06-22 NOTE — Assessment & Plan Note (Addendum)
Some muscle aches/cramps. Check CPK. Continue lipitor for now.

## 2015-06-22 NOTE — Progress Notes (Signed)
BP 122/76 mmHg  Pulse 64  Temp(Src) 98.6 F (37 C) (Oral)  Wt 277 lb 8 oz (125.873 kg)   CC: 6 mo f/u visit  Subjective:    Patient ID: Logan Valdez., male    DOB: 03/12/1946, 69 y.o.   MRN: [id number]  HPI: Logan Blunck. is a 69 y.o. male presenting on 06/22/2015 for Follow-up   Pleasant 69 yo with h/o stroke 2009 with mild residual memory loss, hypercoagulable (lupus AC positive) on coumadin, chronic lower back pain with L lumbar radiculopathy from DDD (followed by Dr. Ethelene Hal) presents today for 6 mo f/u visit.  Some leg cramping - OTC potassium was started. This helps. Also takes cherry and mustad nightly.   DM - regularly does not check sugars.  Compliant with antihyperglycemic regimen which includes: diet controlled.  Denies low sugars or hypoglycemic symptoms.  occasoinal paresthesias at feet. Last diabetic eye exam DUE.  Pneumovax: due in 6 months.  Prevnar: 12/2014. Lab Results  Component Value Date   HGBA1C 7.0* 12/15/2014   Diabetic Foot Exam - Simple   Simple Foot Form  Diabetic Foot exam was performed with the following findings:  Yes 06/22/2015 10:42 AM  Visual Inspection  No deformities, no ulcerations, no other skin breakdown bilaterally:  Yes  Sensation Testing  See comments:  Yes  Pulse Check  Posterior Tibialis and Dorsalis pulse intact bilaterally:  Yes  Comments  Mildly diminished monofilament on left     HTN - Compliant with current antihypertensive regimen of metoprolol 25mg  bid.  Does not check blood pressures at home.  No low blood pressure readings or symptoms of dizziness/syncope.  Denies HA, vision changes, CP/tightness, SOB, leg swelling.   HLD - compliant on lipitor 20mg  daily. Some leg cramping.   Relevant past medical, surgical, family and social history reviewed and updated as indicated. Interim medical history since our last visit reviewed. Allergies and medications reviewed and updated. Current Outpatient Prescriptions on  File Prior to Visit  Medication Sig  . atorvastatin (LIPITOR) 20 MG tablet TAKE 1 TABLET BY MOUTH DAILY  . docusate sodium (COLACE) 100 MG capsule Take 100 mg by mouth daily as needed for mild constipation.  . gabapentin (NEURONTIN) 300 MG capsule TAKE ONE CAPSULE BY MOUTH THREE TIMES DAILY  . HYDROcodone-acetaminophen (NORCO) 10-325 MG per tablet Take 1 tablet by mouth every 6 (six) hours as needed.    Marland Kitchen L-Methylfolate-B6-B12 (FOLTANX) 3-35-2 MG TABS Take 1 tablet by mouth 2 (two) times daily.  . memantine (NAMENDA) 10 MG tablet Take 1 tablet (10 mg total) by mouth 2 (two) times daily.  . metoprolol tartrate (LOPRESSOR) 25 MG tablet TAKE 1 TABLET BY MOUTH TWICE A DAY  . polyethylene glycol powder (GLYCOLAX/MIRALAX) powder Take 17 g by mouth daily as needed.  . warfarin (COUMADIN) 5 MG tablet TAKE AS DIRECTED BY COUMADIN CLINIC  . [DISCONTINUED] metoprolol succinate (TOPROL-XL) 100 MG 24 hr tablet 1/2 by mouth daily   No current facility-administered medications on file prior to visit.    Review of Systems Per HPI unless specifically indicated above     Objective:    BP 122/76 mmHg  Pulse 64  Temp(Src) 98.6 F (37 C) (Oral)  Wt 277 lb 8 oz (125.873 kg)  Wt Readings from Last 3 Encounters:  06/22/15 277 lb 8 oz (125.873 kg)  04/12/15 279 lb (126.554 kg)  01/13/15 286 lb 3.2 oz (129.819 kg)   Body mass index is 45.46 kg/(m^2).  Physical Exam  Constitutional: He appears well-developed and well-nourished. No distress.  HENT:  Head: Normocephalic and atraumatic.  Right Ear: External ear normal.  Left Ear: External ear normal.  Nose: Nose normal.  Mouth/Throat: Oropharynx is clear and moist. No oropharyngeal exudate.  Eyes: Conjunctivae and EOM are normal. Pupils are equal, round, and reactive to light. No scleral icterus.  Neck: Normal range of motion. Neck supple.  Cardiovascular: Normal rate, regular rhythm, normal heart sounds and intact distal pulses.   No murmur  heard. Pulmonary/Chest: Effort normal and breath sounds normal. No respiratory distress. He has no wheezes. He has no rales.  Musculoskeletal: He exhibits no edema.  See HPI for foot exam if done  Lymphadenopathy:    He has no cervical adenopathy.  Skin: Skin is warm and dry. No rash noted.  Psychiatric: He has a normal mood and affect.  Nursing note and vitals reviewed.  Results for orders placed or performed in visit on 06/21/15  POCT INR  Result Value Ref Range   INR 3.5       Assessment & Plan:   Problem List Items Addressed This Visit    Diabetes type 2, controlled    Chronic, recheck A1c today. Diet controlled. Foot exam today. rec schedule eye exam as due.      Relevant Orders   Basic metabolic panel   Hemoglobin A1c   HYPERLIPIDEMIA    Some muscle aches/cramps. Check CPK. Continue lipitor for now.      Relevant Orders   CK   HTN (hypertension) - Primary    Chronic, stable. Continue current regimen of beta blocker.      Leg cramps    Nocturnal. Check CPK, lytes today.          Follow up plan: Return in about 6 months (around 12/23/2015), or as needed, for medicare wellness visit.

## 2015-06-22 NOTE — Assessment & Plan Note (Signed)
Chronic, recheck A1c today. Diet controlled. Foot exam today. rec schedule eye exam as due.

## 2015-06-22 NOTE — Assessment & Plan Note (Signed)
Nocturnal. Check CPK, lytes today.

## 2015-06-22 NOTE — Patient Instructions (Addendum)
Make sure you're getting 6-8 8 oz glasses of clear liquids daily. Schedule eye exam as you're due. labwork today.  Return as needed or in 6 months for medicare wellness visit

## 2015-06-22 NOTE — Assessment & Plan Note (Signed)
Chronic, stable. Continue current regimen of beta blocker.  

## 2015-06-22 NOTE — Progress Notes (Signed)
Pre visit review using our clinic review tool, if applicable. No additional management support is needed unless otherwise documented below in the visit note. 

## 2015-07-01 ENCOUNTER — Other Ambulatory Visit: Payer: Self-pay | Admitting: Family Medicine

## 2015-07-15 ENCOUNTER — Ambulatory Visit (INDEPENDENT_AMBULATORY_CARE_PROVIDER_SITE_OTHER): Payer: PPO | Admitting: Nurse Practitioner

## 2015-07-15 ENCOUNTER — Encounter: Payer: Self-pay | Admitting: Nurse Practitioner

## 2015-07-15 ENCOUNTER — Encounter (INDEPENDENT_AMBULATORY_CARE_PROVIDER_SITE_OTHER): Payer: Self-pay

## 2015-07-15 VITALS — BP 142/78 | HR 68 | Wt 276.2 lb

## 2015-07-15 DIAGNOSIS — F0150 Vascular dementia without behavioral disturbance: Secondary | ICD-10-CM | POA: Diagnosis not present

## 2015-07-15 DIAGNOSIS — I10 Essential (primary) hypertension: Secondary | ICD-10-CM | POA: Diagnosis not present

## 2015-07-15 DIAGNOSIS — Z8679 Personal history of other diseases of the circulatory system: Secondary | ICD-10-CM | POA: Diagnosis not present

## 2015-07-15 DIAGNOSIS — R413 Other amnesia: Secondary | ICD-10-CM | POA: Diagnosis not present

## 2015-07-15 DIAGNOSIS — E782 Mixed hyperlipidemia: Secondary | ICD-10-CM

## 2015-07-15 NOTE — Patient Instructions (Addendum)
Continue Namenda 10 mg twice daily Continue Coumadin for secondary stroke prevention, history of pulmonary embolus Hemoglobin A1c goal below 6.5,  lipids with LDL cholesterol goal below 70 mg percent  hypertension with blood pressure goal below 130/90. Today's reading 142/78  Congratulations on weight loss, and advised to continue healthy diet and weight loss for overall health and well-being Follow-up in 6 months

## 2015-07-15 NOTE — Progress Notes (Addendum)
[facility]  PATIENT: Logan Logan. DOB: December 21, 1945   REASON FOR VISIT: Follow-up for history of stroke, memory loss, hypertension, hyperlipidemia HISTORY FROM: Patient and wife    HISTORY OF PRESENT ILLNESS:He has history of left posterior cerebral artery infarct in October 2009. He has risk factors of diabetes and hypertension obesity and hyperlipidemia. At the time of his stroke he was found to have a PFO. He elected not to have it closed.  Update 06/10/2014:Mr. 75, 69 year old male returns for followup. Last seen in this office 2/5/ 2015 Carotid Doppler revealed 50-69% stenosis involving the left proximal ICA in September 2014. He states he is doing well and still has mild short-term memory difficulties which appear to be unchanged. Patient is currently taking Namenda XR 28 mg and he is tolerating without difficulty. His memory is stable .He has no new neurologic complaints, he returns for reevaluation. Due to chronic back pain he gets no exercise. He has not had further stroke TIA symptoms. He remains on Coumadin with minimal bruising.   Update 07/15/2015 : He returns for follow-up today complaint by his wife. The patient was unable to afford Namenda XR. 28 mg and hence switched back to Namenda 10 mg twice daily. He is tolerating it well but wife feels subjectively maybe a little bit worse. He continues to have memory and cognitive difficulties but under wife's supervision he does fine. He is mainly bothered by severe low back pain but has not been considered a surgical candidate. He has limited mobility and does not exercise a lot but has lost some weight approximately 13 pounds. He goes to pain management  He remains on warfarin which is tolerating well with only minor bruising and no significant bleeding episodes. He had carotid ultrasound done on 07/15/14 show 50-69% left ICA stenosis. He has had no recurrent stroke or TIA symptoms. He is tolerating  Lipitor well without any side effects. Last lipid profile was in February . Blood pressure is mildly elevated in the office today 142/78.  REVIEW OF SYSTEMS: Full 14 system review of systems performed and notable only for those listed, all others are neg:  Constitutional: neg  Cardiovascular: neg Ear/Nose/Throat: neg  Skin: neg Eyes: neg Respiratory: neg Gastroitestinal: neg  Hematology/Lymphatic: neg  Endocrine: neg Musculoskeletal: Muscle cramps Allergy/Immunology: neg Neurological: Memory loss Psychiatric: Confusion Sleep : neg   ALLERGIES: Allergies  Allergen Reactions  . Hydrochlorothiazide   . Indomethacin     syncope  . Pravastatin Sodium     REACTION: rash 11/09  . Lisinopril     REACTION: HA  . Micardis [Telmisartan]     REACTION: HA    HOME MEDICATIONS: Outpatient Prescriptions Prior to Visit  Medication Sig Dispense Refill  . atorvastatin (LIPITOR) 20 MG tablet TAKE 1 TABLET BY MOUTH DAILY 30 tablet 11  . docusate sodium (COLACE) 100 MG capsule Take 100 mg by mouth daily as needed for mild constipation.    . gabapentin (NEURONTIN) 300 MG capsule TAKE ONE CAPSULE BY MOUTH THREE TIMES DAILY 90 capsule 0  . HYDROcodone-acetaminophen (NORCO) 10-325 MG per tablet Take 1 tablet by mouth every 6 (six) hours as needed.      Marland Kitchen L-Methylfolate-B6-B12 (FOLTANX) 3-35-2 MG TABS Take 1 tablet by mouth 2 (two) times daily. 180 tablet 1  . memantine (NAMENDA) 10 MG tablet Take 1 tablet (10 mg total) by mouth 2 (two) times daily. 60 tablet 5  . metoprolol tartrate (LOPRESSOR) 25 MG tablet TAKE 1  TABLET BY MOUTH TWICE A DAY 180 tablet 1  . polyethylene glycol powder (GLYCOLAX/MIRALAX) powder Take 17 g by mouth daily as needed. 3350 g 1  . potassium gluconate 595 MG TABS tablet Take 595 mg by mouth daily.    Marland Kitchen warfarin (COUMADIN) 5 MG tablet TAKE AS DIRECTED BY COUMADIN CLINIC 30 tablet 11   No facility-administered medications prior to visit.    PAST MEDICAL HISTORY: Past  Medical History  Diagnosis Date  . Obesity   . Dysmetabolic syndrome X   . Gout, unspecified   . Unspecified sleep apnea   . Mixed hyperlipidemia   . Type II or unspecified type diabetes mellitus without mention of complication, uncontrolled   . History of pulmonary embolism   . History of DVT (deep vein thrombosis)   . History of thrombophlebitis   . HTN (hypertension)   . History of stroke 2009    Sethi  . PFO (patent foramen ovale)   . Chronic lower back pain     Ramos  . DDD (degenerative disc disease), lumbar   . Primary hypercoagulable state 05/14/2008    Personal h/o DVT, PE, PFO, and lupus AC +     PAST SURGICAL HISTORY: Past Surgical History  Procedure Laterality Date  . Wrist surgery      Right wrist repair post trauma  . Colonoscopy  2000    [facility]    FAMILY HISTORY: Family History  Problem Relation Age of Onset  . Emphysema Father   . Diabetes Brother   . Hypertension Brother   . Heart attack Brother 33  . Skin cancer Brother   . Lung cancer Brother   . Cancer Maternal Aunt     ? Type   . Stroke Neg Hx     SOCIAL HISTORY: Social History   Social History  . Marital Status: Married    Spouse Name: N/A  . Number of Children: 4  . Years of Education: 8 th   Occupational History  . Not on file.   Social History Main Topics  . Smoking status: Never Smoker   . Smokeless tobacco: Never Used  . Alcohol Use: No  . Drug Use: No  . Sexual Activity: Not on file   Other Topics Concern  . Not on file   Social History Narrative   Patient is married with 4 children.   Patient is right handed.   Patient has 8 th grade education.   Patient drinks 4-5 cups daily.     PHYSICAL EXAM  Filed Vitals:   07/15/15 1302  BP: 142/78  Pulse: 68  Weight: 276 lb 3.2 oz (125.283 kg)   Body mass index is 45.25 kg/(m^2). Generalized: Well developed, obese male in no acute distress  Head: normocephalic and atraumatic,. Oropharynx benign  Neck: Supple,  no carotid bruits  Cardiac: Regular rate rhythm, no murmur  Musculoskeletal: No deformity  Neurological examination  Mentation: Alert oriented to time, place, history taking. MMSE 18/30 missing items in orientation, calculation , unable to write a sentence or copy a figure. AFT 9. Clock drawing 0/4.  Follows all commands speech and language fluent  Cranial nerve II-XII: Pupils were equal round reactive to light extraocular movements were full, visual field show partial right homonymous hemianopsia on confrontational test. Facial sensation and strength were normal. Hard of hearing. Uvula tongue midline. head turning and shoulder shrug were normal and symmetric.Tongue protrusion into cheek strength was normal.  Motor: normal bulk and tone, full strength in  the BUE, BLE, fine finger movements normal, no pronator drift. No focal weakness  Sensory: normal and symmetric to light touch, pinprick, and vibration  Coordination: finger-nose-finger, heel-to-shin bilaterally, no dysmetria  Reflexes: 1+ upper lower and symmetric  Gait and Station: Rising up from seated position without assistance, normal stance, moderate stride, good arm swing, smooth turning, able to perform tiptoe, and heel walking without difficulty. Tandem gait is steady no assistive device     DIAGNOSTIC DATA (LABS, IMAGING, TESTING) - I reviewed patient records, labs, notes, testing and imaging myself where available.      Component Value Date/Time   NA 138 06/22/2015 1053   K 4.4 06/22/2015 1053   CL 103 06/22/2015 1053   CO2 29 06/22/2015 1053   GLUCOSE 132* 06/22/2015 1053   BUN 13 06/22/2015 1053   CREATININE 0.80 06/22/2015 1053   CALCIUM 9.0 06/22/2015 1053   PROT 6.8 12/15/2014 0910   ALBUMIN 3.9 12/15/2014 0910   AST 24 12/15/2014 0910   ALT 25 12/15/2014 0910   ALKPHOS 55 12/15/2014 0910   BILITOT 0.6 12/15/2014 0910   GFRNONAA >60 08/26/2008 1607   GFRAA  08/26/2008 1607    >60        The eGFR has  been calculated using the MDRD equation. This calculation has not been validated in all clinical   Lab Results  Component Value Date   CHOL 152 12/15/2014   HDL 56.60 12/15/2014   LDLCALC 73 12/15/2014   LDLDIRECT 155.1 06/19/2014   TRIG 112.0 12/15/2014   CHOLHDL 3 12/15/2014   Lab Results  Component Value Date   HGBA1C 6.5 06/22/2015   Lab Results  Component Value Date   VITAMINB12 >1500* 12/15/2014   Lab Results  Component Value Date   TSH 2.03 12/15/2014      ASSESSMENT AND PLAN  69 y.o. year old male  has a past medical history of Obesity; Dysmetabolic syndrome X;  Unspecified sleep apnea; Mixed hyperlipidemia; Type II or unspecified type diabetes mellitus without mention of complication, uncontrolled; History of pulmonary embolism; History of DVT (deep vein thrombosis); History of thrombophlebitis; HTN (hypertension); History of stroke (2009); PFO (patent foramen ovale); Chronic lower back pain; DDD (degenerative disc disease), lumbar; and Primary hypercoagulable state (05/14/2008). here to follow-up.The patient is a current patient of Dr. Leonie Man  who is out of the office today . This note is sent to the work in doctor.      Continue Namenda 10 mg twice daily Continue Coumadin for secondary stroke prevention, history of pulmonary embolus Hemoglobin A1c goal below 6.5,  lipids with LDL cholesterol goal below 70 mg percent  hypertension with blood pressure goal below 130/90. Today's reading 142/78  Congratulations on weight loss, and advised to continue healthy diet and weight loss for overall health and well-being Follow-up carotid Doppler to be scheduled  Follow-up in 6 months Dennie Bible, [facility], [facility], APRN  [facility] [address], [place], [place] [address] [phone]  Personally  participated in and made any corrections needed to history, physical, neuro exam,assessment and plan as stated above.  Sarina Ill, MD [facility]

## 2015-07-19 ENCOUNTER — Ambulatory Visit (INDEPENDENT_AMBULATORY_CARE_PROVIDER_SITE_OTHER): Payer: PPO | Admitting: *Deleted

## 2015-07-19 ENCOUNTER — Other Ambulatory Visit (INDEPENDENT_AMBULATORY_CARE_PROVIDER_SITE_OTHER): Payer: PPO

## 2015-07-19 DIAGNOSIS — Z86718 Personal history of other venous thrombosis and embolism: Secondary | ICD-10-CM | POA: Diagnosis not present

## 2015-07-19 DIAGNOSIS — Z5181 Encounter for therapeutic drug level monitoring: Secondary | ICD-10-CM

## 2015-07-19 LAB — POCT INR: INR: 3.4

## 2015-07-21 NOTE — Progress Notes (Signed)
Pre visit review using our clinic review tool, if applicable. No additional management support is needed unless otherwise documented below in the visit note. 

## 2015-07-30 ENCOUNTER — Other Ambulatory Visit: Payer: Self-pay | Admitting: Neurology

## 2015-08-02 ENCOUNTER — Telehealth: Payer: Self-pay | Admitting: Neurology

## 2015-08-02 NOTE — Telephone Encounter (Signed)
Patient called regarding L-Methylfolate-B6-B12 (FOLTANX) 3-35-2 MG TABS. Insurance will not cover this medication. Pharmacy has advised patient that they haven't heard from [place]. Please call spouse to advise or call pharmacy to fill medication.

## 2015-08-02 NOTE — Telephone Encounter (Signed)
I called wife and let her know prescription  Foltanx e-scribed to [facility].   She verbalized understanding.

## 2015-08-05 ENCOUNTER — Ambulatory Visit (INDEPENDENT_AMBULATORY_CARE_PROVIDER_SITE_OTHER): Payer: PPO | Admitting: *Deleted

## 2015-08-05 DIAGNOSIS — Z5181 Encounter for therapeutic drug level monitoring: Secondary | ICD-10-CM | POA: Diagnosis not present

## 2015-08-05 LAB — POCT INR: INR: 2.8

## 2015-08-05 NOTE — Progress Notes (Signed)
Pre visit review using our clinic review tool, if applicable. No additional management support is needed unless otherwise documented below in the visit note. 

## 2015-08-09 ENCOUNTER — Ambulatory Visit: Payer: PPO

## 2015-08-13 ENCOUNTER — Other Ambulatory Visit: Payer: Self-pay | Admitting: Family Medicine

## 2015-08-31 ENCOUNTER — Encounter (HOSPITAL_COMMUNITY): Payer: Self-pay | Admitting: Emergency Medicine

## 2015-08-31 ENCOUNTER — Emergency Department (HOSPITAL_COMMUNITY)
Admission: EM | Admit: 2015-08-31 | Discharge: 2015-08-31 | Disposition: A | Discharge status: Home / Self Care | Payer: PPO | Attending: Emergency Medicine | Admitting: Emergency Medicine

## 2015-08-31 ENCOUNTER — Telehealth: Payer: Self-pay | Admitting: Family Medicine

## 2015-08-31 ENCOUNTER — Emergency Department (HOSPITAL_COMMUNITY): Payer: PPO

## 2015-08-31 DIAGNOSIS — R4789 Other speech disturbances: Secondary | ICD-10-CM | POA: Insufficient documentation

## 2015-08-31 DIAGNOSIS — Q211 Atrial septal defect: Secondary | ICD-10-CM | POA: Diagnosis not present

## 2015-08-31 DIAGNOSIS — R4182 Altered mental status, unspecified: Secondary | ICD-10-CM | POA: Diagnosis present

## 2015-08-31 DIAGNOSIS — Z862 Personal history of diseases of the blood and blood-forming organs and certain disorders involving the immune mechanism: Secondary | ICD-10-CM | POA: Diagnosis not present

## 2015-08-31 DIAGNOSIS — E119 Type 2 diabetes mellitus without complications: Secondary | ICD-10-CM | POA: Diagnosis not present

## 2015-08-31 DIAGNOSIS — R410 Disorientation, unspecified: Secondary | ICD-10-CM | POA: Diagnosis not present

## 2015-08-31 DIAGNOSIS — Z8673 Personal history of transient ischemic attack (TIA), and cerebral infarction without residual deficits: Secondary | ICD-10-CM | POA: Diagnosis not present

## 2015-08-31 DIAGNOSIS — E669 Obesity, unspecified: Secondary | ICD-10-CM | POA: Insufficient documentation

## 2015-08-31 DIAGNOSIS — M549 Dorsalgia, unspecified: Secondary | ICD-10-CM | POA: Diagnosis not present

## 2015-08-31 DIAGNOSIS — Z86718 Personal history of other venous thrombosis and embolism: Secondary | ICD-10-CM | POA: Diagnosis not present

## 2015-08-31 DIAGNOSIS — E785 Hyperlipidemia, unspecified: Secondary | ICD-10-CM | POA: Insufficient documentation

## 2015-08-31 DIAGNOSIS — G8929 Other chronic pain: Secondary | ICD-10-CM | POA: Insufficient documentation

## 2015-08-31 DIAGNOSIS — Z86711 Personal history of pulmonary embolism: Secondary | ICD-10-CM | POA: Diagnosis not present

## 2015-08-31 DIAGNOSIS — Z7901 Long term (current) use of anticoagulants: Secondary | ICD-10-CM | POA: Insufficient documentation

## 2015-08-31 DIAGNOSIS — Z79899 Other long term (current) drug therapy: Secondary | ICD-10-CM | POA: Diagnosis not present

## 2015-08-31 LAB — COMPREHENSIVE METABOLIC PANEL
ALK PHOS: 59 U/L (ref 38–126)
ALT: 22 U/L (ref 17–63)
AST: 28 U/L (ref 15–41)
Albumin: 3.6 g/dL (ref 3.5–5.0)
Anion gap: 7 (ref 5–15)
BILIRUBIN TOTAL: 1.1 mg/dL (ref 0.3–1.2)
BUN: 9 mg/dL (ref 6–20)
CALCIUM: 9.2 mg/dL (ref 8.9–10.3)
CHLORIDE: 103 mmol/L (ref 101–111)
CO2: 29 mmol/L (ref 22–32)
CREATININE: 0.85 mg/dL (ref 0.61–1.24)
GFR calc Af Amer: >60 mL/min (ref >60)
Glucose, Bld: 140 mg/dL — ABNORMAL HIGH (ref 65–99)
Potassium: 4.8 mmol/L (ref 3.5–5.1)
Sodium: 139 mmol/L (ref 135–145)
Total Protein: 6.4 g/dL — ABNORMAL LOW (ref 6.5–8.1)

## 2015-08-31 LAB — URINALYSIS, ROUTINE W REFLEX MICROSCOPIC
Bilirubin Urine: NEGATIVE
GLUCOSE, UA: NEGATIVE mg/dL
Hgb urine dipstick: NEGATIVE
Ketones, ur: NEGATIVE mg/dL
LEUKOCYTES UA: NEGATIVE
Nitrite: NEGATIVE
PH: 7 (ref 5.0–8.0)
Protein, ur: NEGATIVE mg/dL
Specific Gravity, Urine: 1.016 (ref 1.005–1.030)
Urobilinogen, UA: 0.2 mg/dL (ref 0.0–1.0)

## 2015-08-31 LAB — CBC WITH DIFFERENTIAL/PLATELET
BASOS PCT: 0 %
Basophils Absolute: 0 10*3/uL (ref 0.0–0.1)
EOS ABS: 0.1 10*3/uL (ref 0.0–0.7)
EOS PCT: 1 %
HCT: 41.7 % (ref 39.0–52.0)
Hemoglobin: 13.5 g/dL (ref 13.0–17.0)
LYMPHS ABS: 1.2 10*3/uL (ref 0.7–4.0)
Lymphocytes Relative: 13 %
MCH: 29.7 pg (ref 26.0–34.0)
MCHC: 32.4 g/dL (ref 30.0–36.0)
MCV: 91.6 fL (ref 78.0–100.0)
Monocytes Absolute: 0.7 10*3/uL (ref 0.1–1.0)
Monocytes Relative: 8 %
Neutro Abs: 7.5 10*3/uL (ref 1.7–7.7)
Neutrophils Relative %: 78 %
PLATELETS: 190 10*3/uL (ref 150–400)
RBC: 4.55 MIL/uL (ref 4.22–5.81)
RDW: 13.4 % (ref 11.5–15.5)
WBC: 9.6 10*3/uL (ref 4.0–10.5)

## 2015-08-31 LAB — I-STAT TROPONIN, ED: TROPONIN I, POC: 0 ng/mL (ref 0.00–0.08)

## 2015-08-31 LAB — PROTIME-INR
INR: 2.57 — AB (ref 0.00–1.49)
Prothrombin Time: 27.2 seconds — ABNORMAL HIGH (ref 11.6–15.2)

## 2015-08-31 NOTE — Telephone Encounter (Signed)
Patient Name: Logan Valdez  DOB: 11/26/1945    Initial Comment Caller states husband is confused and asking for help - hx of stroke   Nurse Assessment  Nurse: Sherilyn CooterHenry, RN, Thurmond ButtsWade Date/Time Lamount Cohen(Eastern Time): 08/31/2015 9:23:24 AM  Confirm and document reason for call. If symptomatic, describe symptoms. ---Caller states that last night, her husband was confused and disoriented. He woke up at 4am this morning and he was feeling confused again. He does know who he is and where he is. He does know the day and that tomorrow is his birthday. He has been urinating. Denies fever. Denies any new medications. He had a stroke 5 years ago. He takes chronic back pain and takes pain meds.  Has the patient traveled out of the country within the last 30 days? ---No  Does the patient have any new or worsening symptoms? ---Yes  Will a triage be completed? ---Yes  Related visit to physician within the last 2 weeks? ---No  Does the PT have any chronic conditions? (i.e. diabetes, asthma, etc.) ---Yes  List chronic conditions. ---Chronic Back Pain, Hx of PE, Hx of CVA.     Guidelines    Guideline Title Affirmed Question Affirmed Notes  Confusion - Delirium Bizarre or paranoid behavior He is paranoid   Final Disposition User   Go to ED Now Sherilyn CooterHenry, RN, Thurmond ButtsWade    [facility]   Disagree/Comply: Comply

## 2015-08-31 NOTE — Telephone Encounter (Signed)
Noted. Will await ER eval. plz call later today for an update.

## 2015-08-31 NOTE — ED Notes (Signed)
Patient has returned from being out of the department; patient placed back on monitor, continuous pulse oximetry and blood pressure cuff; visitor at bedside 

## 2015-08-31 NOTE — ED Notes (Signed)
Pt sts not feeling right and some aphasia at times starting last night; pt sts hx of stroke and feels similar; pt denies obvious weakness; pt alert and oriented at present

## 2015-08-31 NOTE — ED Notes (Signed)
Patient being transported to Radiology via wheelchair at this time

## 2015-08-31 NOTE — Discharge Instructions (Signed)
Delirium °Delirium is a state of mental confusion. It comes on quickly and causes significant changes in a person's thinking and behavior. People with delirium usually have trouble paying attention to what is going on or knowing where they are. They may become very withdrawn or very emotional and unable to sit still. They may even see or feel things that are not there (hallucinations). Delirium is a sign of a serious underlying medical condition. °CAUSES °Delirium occurs when something suddenly affects the signals that the brain sends out. Brain signals can be affected by anything that puts severe stress on the body and brain and causes brain chemicals to be out of balance. The most common causes of delirium include: °· Infections. These may be bacterial, viral, fungal, or protozoal. °· Medicines. These include many over-the-counter and prescription medicines. °· Recreational drugs. °· Substance withdrawal. This occurs with sudden discontinuation of alcohol, certain medicines, or recreational drugs. °· Surgery. °· Sudden vascular events, such as stroke, brain hemorrhage, and severe migraine. °· Other brain disorders, such as tumors, seizures, and physical head trauma. °· Metabolic disorders, such as kidney or liver failure. °· Low blood oxygen (anoxia). This may occur with lung disease, cardiac arrest, or carbon monoxide poisoning. °· Hormone imbalances (endocrinopathies), such as an overactive thyroid (hyperthyroidism) or underactive thyroid (hypothyroidism). °· Vitamin deficiencies. °RISK FACTORS °This condition is more likely to develop in: °· Children. °· Older people. °· People who live alone. °· People who have vision loss or hearing loss. °· People who have existing brain disease, such as dementia. °· People who have long-lasting (chronic) medical conditions, such as heart disease. °· People who are hospitalized for long periods of time. °SYMPTOMS °Delirium starts with a sudden change in a person's thinking  or behavior. Symptoms come and go (fluctuate) over time, and they are often worse at the end of the day. Symptoms include: °· Not being able to stay awake (drowsiness) or pay attention. °· Being confused about places, time, and people. °· Forgetfulness. °· Having extreme energy levels. These may be low or high. °· Changes in sleep patterns. °· Extreme mood swings, such as anger or anxiety. °· Focusing on things or ideas that are not important. °· Rambling and senseless talking. °· Difficulty speaking, understanding speech, or both. °· Hallucinations. °· Tremor or unsteady gait. °DIAGNOSIS °People with delirium may not realize that they have the condition. Often, a family member or health care provider is the first person to notice the changes. The health care provider will obtain a detailed history of current symptoms, medical issues, medicines, and recreational drug use. The health care provider will perform a mental status examination by: °· Asking questions to check for confusion. °· Watching for abnormal behavior. °The health care provider may perform a physical exam and order lab tests or additional studies to determine the cause of the delirium. °TREATMENT °Treatment of delirium depends on the cause and severity. Delirium usually goes away within days or weeks of treating the underlying cause. In the meantime, the person should not be left alone because he or she may accidentally cause self-harm. Treatment includes supportive care, such as: °· Increased light during the day and decreased light at night. °· Low noise level. °· Uninterrupted sleep. °· A regular daily schedule. °· Clocks and calendars to help with orientation. °· Familiar objects, including the person's pictures and clothing. °· Frequent visits from familiar family and friends. °· Healthy diet. °· Exercise. °In more severe cases of delirium, medicine may be prescribed   to help the person to keep calm and think more clearly. °HOME CARE  INSTRUCTIONS °· Any supportive care should be continued as told by the health care provider. °· All medicines should be used as told by the health care provider. This is important. °· The health care provider should be consulted before over-the-counter medicines, herbs, or supplements are used. °· All follow-up visits should be kept as told by the health care provider. This is important. °· Alcohol and recreational drugs should be avoided as told by the health care provider. °SEEK MEDICAL CARE IF: °· Symptoms do not get better or they become worse. °· New symptoms of delirium develop. °· Caring for the person at home does not seem safe. °· Eating, drinking, or communicating stops. °· There are side effects of medicines, such as changes in sleep patterns, dizziness, weight gain, restlessness, movement changes, or tremors. °SEEK IMMEDIATE MEDICAL CARE IF: °· Serious thoughts occur about self-harm or about hurting others. °· There are serious side effects of medicine, such as: °¨ Swelling of the face, lips, tongue, or throat. °¨ Fever, confusion, muscle spasms, or seizures. °  °This information is not intended to replace advice given to you by your health care provider. Make sure you discuss any questions you have with your health care provider. °  °Document Released: 07/17/2012 Document Revised: 03/09/2015 Document Reviewed: 12/16/2014 °Elsevier Interactive Patient Education ©2016 [employer] ° °

## 2015-08-31 NOTE — ED Notes (Signed)
Patient transported to CT 

## 2015-08-31 NOTE — ED Notes (Signed)
Brought patient back to room via wheelchair; patient undressed, in gown, on monitor, continuous pulse oximetry and blood pressure; visitor at bedside

## 2015-08-31 NOTE — ED Provider Notes (Signed)
CSN: [id number]     Arrival date & time 08/31/15  1037 History   First MD Initiated Contact with Patient 08/31/15 1046     Chief Complaint  Patient presents with  . Altered Mental Status     (Consider location/radiation/quality/duration/timing/severity/associated sxs/prior Treatment) The history is provided by the patient.   Logan Valdez is a 69 yo M PMH DM2, HTN, HLD, vascular dementia, CVA (45yrs ago), DVT, PE, and lupus AC + on chronic warfarin, that is presenting today with altered mental status. His wife noticed last night that he was saying things that didn't make sense. He was commenting on things that were not occurring. He had done this the past two days. She reported some possible slurring of his speech.  He had some confusion this morning and wanted her to get help as he didn't feel himself. He reports feeling a weakness. Denies any fever, chills, chest pain, SOB, dysuria, abdominal pain, or leg swelling.  His wife sets out his medications and she thought there was one missing two days ago. He sleeps mostly during the day and is awake at night. The past two days he hasn't been able to sleep during the day. Denies any new medications or changes in his medications.   Past Medical History  Diagnosis Date  . Obesity   . Dysmetabolic syndrome X   . Gout, unspecified   . Unspecified sleep apnea   . Mixed hyperlipidemia   . Type II or unspecified type diabetes mellitus without mention of complication, uncontrolled   . History of pulmonary embolism   . History of DVT (deep vein thrombosis)   . History of thrombophlebitis   . HTN (hypertension)   . History of stroke 2009    Sethi  . PFO (patent foramen ovale)   . Chronic lower back pain     Ramos  . DDD (degenerative disc disease), lumbar   . Primary hypercoagulable state (HCC) 05/14/2008    Personal h/o DVT, PE, PFO, and lupus AC +    Past Surgical History  Procedure Laterality Date  . Wrist surgery      Right wrist repair  post trauma  . Colonoscopy  2000    [facility]   Family History  Problem Relation Age of Onset  . Emphysema Father   . Diabetes Brother   . Hypertension Brother   . Heart attack Brother 43  . Skin cancer Brother   . Lung cancer Brother   . Cancer Maternal Aunt     ? Type   . Stroke Neg Hx    Social History  Substance Use Topics  . Smoking status: Never Smoker   . Smokeless tobacco: Never Used  . Alcohol Use: No    Review of Systems  Constitutional: Negative for fever and chills.  Respiratory: Negative for cough and shortness of breath.   Cardiovascular: Negative for chest pain.  Gastrointestinal: Negative for nausea, vomiting, abdominal pain, diarrhea and constipation.  Genitourinary: Negative for dysuria.  Musculoskeletal: Positive for back pain.  Skin: Negative for rash.  Neurological: Positive for speech difficulty. Negative for syncope, facial asymmetry and light-headedness.      Allergies  Hydrochlorothiazide; Indomethacin; Pravastatin sodium; Lisinopril; and Micardis  Home Medications   Prior to Admission medications   Medication Sig Start Date End Date Taking? Authorizing Provider  atorvastatin (LIPITOR) 20 MG tablet TAKE 1 TABLET BY MOUTH DAILY 12/29/14   Eustaquio Boyden, MD  docusate sodium (COLACE) 100 MG capsule Take 100 mg by mouth  daily as needed for mild constipation.    Historical Provider, MD  gabapentin (NEURONTIN) 300 MG capsule TAKE ONE CAPSULE BY MOUTH THREE TIMES DAILY 08/13/15   Eustaquio BoydenJavier Gutierrez, MD  HYDROcodone-acetaminophen [facility](NORCO) 10-325 MG per tablet Take 1 tablet by mouth every 6 (six) hours as needed.      Historical Provider, MD  L-Methylfolate-B6-B12 [facility](FOLTANX) 3-35-2 MG TABS TAKE 1 TABLET BY MOUTH TWICE A DAY 08/02/15   Nilda RiggsNancy Carolyn Martin, NP  memantine (NAMENDA) 10 MG tablet Take 1 tablet (10 mg total) by mouth 2 (two) times daily. 03/08/15   Micki RileyPramod S Sethi, MD  metoprolol tartrate (LOPRESSOR) 25 MG tablet TAKE 1 TABLET BY MOUTH TWICE A  DAY 09/28/14   Eustaquio BoydenJavier Gutierrez, MD  polyethylene glycol powder (GLYCOLAX/MIRALAX) powder Take 17 g by mouth daily as needed. 04/12/15   Eustaquio BoydenJavier Gutierrez, MD  potassium gluconate 595 MG TABS tablet Take 595 mg by mouth daily.    Historical Provider, MD  warfarin (COUMADIN) 5 MG tablet TAKE AS DIRECTED BY COUMADIN CLINIC 12/01/14   Eustaquio BoydenJavier Gutierrez, MD   BP 135/59 mmHg  Pulse 62  Temp(Src) 98 F (36.7 C) (Oral)  Resp 20  SpO2 99% Physical Exam  Constitutional: He is oriented to person, place, and time. He appears well-developed and well-nourished.  HENT:  Head: Normocephalic and atraumatic.  Eyes: Conjunctivae and EOM are normal. Pupils are equal, round, and reactive to light.  Neck: Normal range of motion. Neck supple.  Cardiovascular: Normal rate, regular rhythm, normal heart sounds and intact distal pulses.   No murmur heard. Pulmonary/Chest: Effort normal and breath sounds normal. No respiratory distress. He has no wheezes.  Abdominal: Soft. Bowel sounds are normal. He exhibits no distension. There is no tenderness.  Musculoskeletal: He exhibits no edema.  Neurological: He is alert and oriented to person, place, and time. No cranial nerve deficit.  Slowed finger to nose with right compared to left   Skin: Skin is warm. Rash noted.  Rash on left forearm    Psychiatric: He has a normal mood and affect.    ED Course  Procedures (including critical care time) Labs Review Labs Reviewed  COMPREHENSIVE METABOLIC PANEL - Abnormal; Notable for the following:    Glucose, Bld 140 (*)    Total Protein 6.4 (*)    All other components within normal limits  PROTIME-INR - Abnormal; Notable for the following:    Prothrombin Time 27.2 (*)    INR 2.57 (*)    All other components within normal limits  CBC WITH DIFFERENTIAL/PLATELET  URINALYSIS, ROUTINE W REFLEX MICROSCOPIC (NOT AT [facility])  Rosezena SensorI-STAT TROPOININ, ED    Imaging Review Dg Chest 2 View  08/31/2015  CLINICAL DATA:  Altered mental  status EXAM: CHEST  2 VIEW COMPARISON:  10/23/2008 FINDINGS: Cardiomediastinal silhouette is stable. No acute infiltrate or pleural effusion. No pulmonary edema. Degenerative changes thoracolumbar spine. IMPRESSION: No active cardiopulmonary disease. Electronically Signed   By: Natasha MeadLiviu  Pop M.D.   On: 08/31/2015 12:42   Ct Head Wo Contrast  08/31/2015  CLINICAL DATA:  Altered mental status/ dysphasia EXAM: CT HEAD WITHOUT CONTRAST TECHNIQUE: Contiguous axial images were obtained from the base of the skull through the vertex without intravenous contrast. COMPARISON:  Head CT August 26, 2008; brain MRI August 28, 2008 FINDINGS: Moderate diffuse atrophy is stable. There is no intracranial mass, hemorrhage, extra-axial fluid collection, or midline shift. There is evidence of a prior infarct in the medial aspect of the left occipital lobe. There is  mild small vessel disease in the centra semiovale bilaterally. No acute infarct is apparent. Bony calvarium appears intact. Visualized mastoid air cells are clear. There is patchy ethmoid sinus disease bilaterally, slightly more on the right than on the left. IMPRESSION: Atrophy. Prior infarct in the medial left occipital lobe. Mild periventricular small vessel disease. No acute infarct apparent. No hemorrhage or mass effect. Patchy ethmoid sinus disease bilaterally. Electronically Signed   By: Bretta Bang III M.D.   On: 08/31/2015 12:30   I have personally reviewed and evaluated these images and lab results as part of my medical decision-making.  EKG: normal EKG, normal sinus rhythm.  Medications - No data to display  MDM   Final diagnoses:  Delirium   Logan Valdez is a 69 yo M that is presenting with altered mental status.  Most likely related to delirium with risk factors being age, dementia, hearing loss, and insomnia.  Lab work is reassuring. Vital signs stable. He has had no re-occurrence of his symptoms since being in the ED. Patient stable for  discharge.   Myra Rude, MD PGY-3, [facility] 08/31/2015, 2:56 PM    Myra Rude, MD 08/31/15 1457  Gwyneth Sprout, MD 08/31/15 [phone]

## 2015-09-01 NOTE — Telephone Encounter (Signed)
Message left for patient's wife to return my call.  

## 2015-09-03 NOTE — Telephone Encounter (Signed)
Noted. will see then.

## 2015-09-03 NOTE — Telephone Encounter (Signed)
Still confused. He can feel it when it starts coming on. ? If pain meds. Trying to stop taking them to see if it makes a difference. Advised not to stop cold [place] as that could cause more problems. Wife says she notices it more at night, so she is thinking he is sundowning. He has a follow up scheduled for 09/07/15.

## 2015-09-07 ENCOUNTER — Ambulatory Visit (INDEPENDENT_AMBULATORY_CARE_PROVIDER_SITE_OTHER): Payer: PPO | Admitting: Family Medicine

## 2015-09-07 ENCOUNTER — Encounter: Payer: Self-pay | Admitting: Family Medicine

## 2015-09-07 VITALS — BP 140/74 | HR 62 | Temp 98.2°F | Wt 274.8 lb

## 2015-09-07 DIAGNOSIS — Z23 Encounter for immunization: Secondary | ICD-10-CM

## 2015-09-07 DIAGNOSIS — F0150 Vascular dementia without behavioral disturbance: Secondary | ICD-10-CM

## 2015-09-07 DIAGNOSIS — M549 Dorsalgia, unspecified: Secondary | ICD-10-CM

## 2015-09-07 DIAGNOSIS — K5903 Drug induced constipation: Secondary | ICD-10-CM

## 2015-09-07 DIAGNOSIS — G8929 Other chronic pain: Secondary | ICD-10-CM

## 2015-09-07 DIAGNOSIS — F05 Delirium due to known physiological condition: Secondary | ICD-10-CM

## 2015-09-07 NOTE — Assessment & Plan Note (Signed)
Body mass index is 45.01 kg/(m^2).

## 2015-09-07 NOTE — Assessment & Plan Note (Signed)
Rec decrease narcotic in setting of recent confusional state. Discussed start tylenol 500mg  TID and then use hydrocodone for breakthrough pain. Discussed bowel regimen.

## 2015-09-07 NOTE — Progress Notes (Signed)
BP 140/74 mmHg  Pulse 62  Temp(Src) 98.2 F (36.8 C)  Wt 274 lb 12 oz (124.626 kg)   CC: ER f/u visit  Subjective:    Patient ID: Logan Flank., male    DOB: [birth year], 69 y.o.   MRN: [id number]  HPI: Logan Valdez. is a 69 y.o. male presenting on 09/07/2015 for Follow-up   Presents today with wife. Recent ER visit on 08/31/2015 with confusion, eval consistent with delirium possibly from extra hydrocodone pain medication. Workup negative including CXR, labs, EKG, UA, and head CT. Records reviewed.   Wife endorses he gets more confused at night time.  He is regularly napping during the day.  He endorses constipation from pain medication. Currently treating with miralax 1 capful daily but this can lead to diarrhea. Not regularly taking colace.  For chronic back pain - takes hydrocodone 10/325mg  up to 5 per day.   Relevant past medical, surgical, family and social history reviewed and updated as indicated. Interim medical history since our last visit reviewed. Allergies and medications reviewed and updated. Current Outpatient Prescriptions on File Prior to Visit  Medication Sig  . atorvastatin (LIPITOR) 20 MG tablet TAKE 1 TABLET BY MOUTH DAILY  . docusate sodium (COLACE) 100 MG capsule Take 100 mg by mouth daily as needed for mild constipation.  . gabapentin (NEURONTIN) 300 MG capsule TAKE ONE CAPSULE BY MOUTH THREE TIMES DAILY  . HYDROcodone-acetaminophen (NORCO) 10-325 MG per tablet Take 1 tablet by mouth every 6 (six) hours as needed.    Marland Kitchen L-Methylfolate-B6-B12 (FOLTANX) 3-35-2 MG TABS TAKE 1 TABLET BY MOUTH TWICE A DAY  . memantine (NAMENDA) 10 MG tablet Take 1 tablet (10 mg total) by mouth 2 (two) times daily.  . metoprolol tartrate (LOPRESSOR) 25 MG tablet TAKE 1 TABLET BY MOUTH TWICE A DAY  . polyethylene glycol powder (GLYCOLAX/MIRALAX) powder Take 17 g by mouth daily as needed.  . potassium gluconate 595 MG TABS tablet Take 595 mg by mouth daily.  Marland Kitchen  warfarin (COUMADIN) 5 MG tablet TAKE AS DIRECTED BY COUMADIN CLINIC  . [DISCONTINUED] metoprolol succinate (TOPROL-XL) 100 MG 24 hr tablet 1/2 by mouth daily   No current facility-administered medications on file prior to visit.    Review of Systems Per HPI unless specifically indicated in ROS section     Objective:    BP 140/74 mmHg  Pulse 62  Temp(Src) 98.2 F (36.8 C)  Wt 274 lb 12 oz (124.626 kg)  Wt Readings from Last 3 Encounters:  09/07/15 274 lb 12 oz (124.626 kg)  07/15/15 276 lb 3.2 oz (125.283 kg)  06/22/15 277 lb 8 oz (125.873 kg)    Body mass index is 45.01 kg/(m^2).  Physical Exam  Constitutional: He appears well-developed and well-nourished. No distress.  HENT:  Mouth/Throat: Oropharynx is clear and moist. No oropharyngeal exudate.  Cardiovascular: Normal rate, regular rhythm, normal heart sounds and intact distal pulses.   No murmur heard. Pulmonary/Chest: Effort normal and breath sounds normal. No respiratory distress. He has no wheezes. He has no rales.  Musculoskeletal: He exhibits no edema.  Skin: Skin is warm and dry. No rash noted.  Psychiatric: He has a normal mood and affect.  Nursing note and vitals reviewed.  Results for orders placed or performed during the hospital encounter of 08/31/15  Comprehensive metabolic panel  Result Value Ref Range   Sodium 139 135 - 145 mmol/L   Potassium 4.8 3.5 - 5.1 mmol/L   Chloride 103  101 - 111 mmol/L   CO2 29 22 - 32 mmol/L   Glucose, Bld 140 (H) 65 - 99 mg/dL   BUN 9 6 - 20 mg/dL   Creatinine, Ser 1.610.85 0.61 - 1.24 mg/dL   Calcium 9.2 8.9 - 09.610.3 mg/dL   Total Protein 6.4 (L) 6.5 - 8.1 g/dL   Albumin 3.6 3.5 - 5.0 g/dL   AST 28 15 - 41 U/L   ALT 22 17 - 63 U/L   Alkaline Phosphatase 59 38 - 126 U/L   Total Bilirubin 1.1 0.3 - 1.2 mg/dL   GFR calc non Af Amer >60 >60 mL/min   GFR calc Af Amer >60 >60 mL/min   Anion gap 7 5 - 15  CBC with Differential  Result Value Ref Range   WBC 9.6 4.0 - 10.5 K/uL    RBC 4.55 4.22 - 5.81 MIL/uL   Hemoglobin 13.5 13.0 - 17.0 g/dL   HCT 04.541.7 40.939.0 - 81.152.0 %   MCV 91.6 78.0 - 100.0 fL   MCH 29.7 26.0 - 34.0 pg   MCHC 32.4 30.0 - 36.0 g/dL   RDW 91.413.4 78.211.5 - 95.615.5 %   Platelets 190 150 - 400 K/uL   Neutrophils Relative % 78 %   Neutro Abs 7.5 1.7 - 7.7 K/uL   Lymphocytes Relative 13 %   Lymphs Abs 1.2 0.7 - 4.0 K/uL   Monocytes Relative 8 %   Monocytes Absolute 0.7 0.1 - 1.0 K/uL   Eosinophils Relative 1 %   Eosinophils Absolute 0.1 0.0 - 0.7 K/uL   Basophils Relative 0 %   Basophils Absolute 0.0 0.0 - 0.1 K/uL  Protime-INR  Result Value Ref Range   Prothrombin Time 27.2 (H) 11.6 - 15.2 seconds   INR 2.57 (H) 0.00 - 1.49  Urinalysis, Routine w reflex microscopic  Result Value Ref Range   Color, Urine YELLOW YELLOW   APPearance CLEAR CLEAR   Specific Gravity, Urine 1.016 1.005 - 1.030   pH 7.0 5.0 - 8.0   Glucose, UA NEGATIVE NEGATIVE mg/dL   Hgb urine dipstick NEGATIVE NEGATIVE   Bilirubin Urine NEGATIVE NEGATIVE   Ketones, ur NEGATIVE NEGATIVE mg/dL   Protein, ur NEGATIVE NEGATIVE mg/dL   Urobilinogen, UA 0.2 0.0 - 1.0 mg/dL   Nitrite NEGATIVE NEGATIVE   Leukocytes, UA NEGATIVE NEGATIVE  I-stat troponin, ED  Result Value Ref Range   Troponin i, poc 0.00 0.00 - 0.08 ng/mL   Comment 3              Assessment & Plan:   Problem List Items Addressed This Visit    Vascular dementia    Continue namenda 10mg  daily. Discussed possible sundowning.      Obesity, Class III, BMI 40-49.9 (morbid obesity) (HCC)    Body mass index is 45.01 kg/(m^2).       Drug-induced constipation with proper administration    Discussed bowel regimen. Discussed decreasing regular hydrocodone use. Continue colace 100mg  daily, use miralax 1/2 capful daily.       Chronic back pain    Rec decrease narcotic in setting of recent confusional state. Discussed start tylenol 500mg  TID and then use hydrocodone for breakthrough pain. Discussed bowel regimen.       Relevant Medications   acetaminophen (TYLENOL) 500 MG tablet   Acute confusional state - Primary    Reviewed reassuring ER evaluation and discussed possible narcotic contribution to acute confusion.  Discussed sleep hygiene measures, discussed possible sundowning in vascular dementia.  Sxs seem to have resolved.       Other Visit Diagnoses    Need for influenza vaccination        Relevant Orders    Flu Vaccine QUAD 36+ mos PF IM (Fluarix & Fluzone Quad PF) (Completed)        Follow up plan: Return if symptoms worsen or fail to improve.

## 2015-09-07 NOTE — Progress Notes (Signed)
Pre visit review using our clinic review tool, if applicable. No additional management support is needed unless otherwise documented below in the visit note. 

## 2015-09-07 NOTE — Patient Instructions (Addendum)
Flu shot today Start with tylenol 500mg  three times daily - use hydrocodone 10/325mg  for breakthrough pain.  Take colace 100mg  daily. Take miralax 1/2 capful daily as needed for constipation, hold for diarrhea.  Avoid daytime naps so you sleep better at night time. Work on bedtime routine.  Lots of light exposure during the day - this will help you better rest at night time.

## 2015-09-07 NOTE — Assessment & Plan Note (Signed)
Reviewed reassuring ER evaluation and discussed possible narcotic contribution to acute confusion.  Discussed sleep hygiene measures, discussed possible sundowning in vascular dementia. Sxs seem to have resolved.

## 2015-09-07 NOTE — Assessment & Plan Note (Signed)
Continue namenda 10mg  daily. Discussed possible sundowning.

## 2015-09-07 NOTE — Assessment & Plan Note (Signed)
Discussed bowel regimen. Discussed decreasing regular hydrocodone use. Continue colace 100mg  daily, use miralax 1/2 capful daily.

## 2015-09-16 ENCOUNTER — Ambulatory Visit (INDEPENDENT_AMBULATORY_CARE_PROVIDER_SITE_OTHER): Payer: PPO | Admitting: *Deleted

## 2015-09-16 DIAGNOSIS — Z86718 Personal history of other venous thrombosis and embolism: Secondary | ICD-10-CM | POA: Diagnosis not present

## 2015-09-16 DIAGNOSIS — Z5181 Encounter for therapeutic drug level monitoring: Secondary | ICD-10-CM

## 2015-09-16 LAB — POCT INR: INR: 2.9

## 2015-09-16 NOTE — Progress Notes (Signed)
Pre visit review using our clinic review tool, if applicable. No additional management support is needed unless otherwise documented below in the visit note. 

## 2015-10-04 ENCOUNTER — Other Ambulatory Visit: Payer: Self-pay | Admitting: Nurse Practitioner

## 2015-10-15 ENCOUNTER — Encounter: Payer: Self-pay | Admitting: Family Medicine

## 2015-10-15 ENCOUNTER — Ambulatory Visit (INDEPENDENT_AMBULATORY_CARE_PROVIDER_SITE_OTHER): Payer: PPO | Admitting: Family Medicine

## 2015-10-15 VITALS — BP 134/82 | HR 64 | Temp 97.5°F | Wt 276.2 lb

## 2015-10-15 DIAGNOSIS — T148XXA Other injury of unspecified body region, initial encounter: Secondary | ICD-10-CM

## 2015-10-15 DIAGNOSIS — F05 Delirium due to known physiological condition: Secondary | ICD-10-CM | POA: Diagnosis not present

## 2015-10-15 DIAGNOSIS — T148 Other injury of unspecified body region: Secondary | ICD-10-CM

## 2015-10-15 MED ORDER — TRIAMCINOLONE ACETONIDE 0.1 % EX CREA: 1.0000 "application " | TOPICAL_CREAM | Freq: Two times a day (BID) | CUTANEOUS | Status: DC

## 2015-10-15 NOTE — Patient Instructions (Signed)
Spots on forearm look better.  I think this is from scratching from itchy dry skin worse in winter months. Make sure to use moisturizing cream like aveeno or eucerin every day after a shower. Try to shower with lukewarm water instead of really hot water. For dry spot on elbow - try triamcinolone steroid cream daily for 2 weeks.

## 2015-10-15 NOTE — Assessment & Plan Note (Signed)
Wife endorses marked improvement with decreased narcotic use.

## 2015-10-15 NOTE — Progress Notes (Signed)
BP 134/82 mmHg  Pulse 64  Temp(Src) 97.5 F (36.4 C) (Oral)  Wt 276 lb 4 oz (125.306 kg)   CC: wound check  Subjective:    Patient ID: Logan FlankEdward C Forrest Jr., male    DOB: 10/24/1946, 69 y.o.   MRN: [id number]  HPI: Logan Flankdward C Bayon Jr. is a 69 y.o. male presenting on 10/15/2015 for Wound Check   Arms are itchy. Left lateral upper forearm with several abrasions, likely from scratching. Some redness and bleeding initially, wife was concerned about this.   He notices worse itching in the mornings. He showers once daily with hot water.   Has decreased hydrocodone use and doing much better. Taking tylenol three times daily.   Relevant past medical, surgical, family and social history reviewed and updated as indicated. Interim medical history since our last visit reviewed. Allergies and medications reviewed and updated. Current Outpatient Prescriptions on File Prior to Visit  Medication Sig  . acetaminophen (TYLENOL) 500 MG tablet Take 500 mg by mouth 3 (three) times daily.  Marland Kitchen. atorvastatin (LIPITOR) 20 MG tablet TAKE 1 TABLET BY MOUTH DAILY  . docusate sodium (COLACE) 100 MG capsule Take 100 mg by mouth daily as needed for mild constipation.  . gabapentin (NEURONTIN) 300 MG capsule TAKE ONE CAPSULE BY MOUTH THREE TIMES DAILY  . HYDROcodone-acetaminophen (NORCO) 10-325 MG per tablet Take 1 tablet by mouth every 6 (six) hours as needed.    Marland Kitchen. L-Methylfolate-B6-B12 (FOLTANX) 3-35-2 MG TABS TAKE 1 TABLET BY MOUTH TWICE A DAY  . memantine (NAMENDA) 10 MG tablet TAKE 1 TABLET BY MOUTH TWICE A DAY  . metoprolol tartrate (LOPRESSOR) 25 MG tablet TAKE 1 TABLET BY MOUTH TWICE A DAY  . polyethylene glycol powder (GLYCOLAX/MIRALAX) powder Take 17 g by mouth daily as needed.  . potassium gluconate 595 MG TABS tablet Take 595 mg by mouth daily.  Marland Kitchen. warfarin (COUMADIN) 5 MG tablet TAKE AS DIRECTED BY COUMADIN CLINIC  . [DISCONTINUED] metoprolol succinate (TOPROL-XL) 100 MG 24 hr tablet 1/2 by mouth  daily   No current facility-administered medications on file prior to visit.    Review of Systems Per HPI unless specifically indicated in ROS section     Objective:    BP 134/82 mmHg  Pulse 64  Temp(Src) 97.5 F (36.4 C) (Oral)  Wt 276 lb 4 oz (125.306 kg)  Wt Readings from Last 3 Encounters:  10/15/15 276 lb 4 oz (125.306 kg)  09/07/15 274 lb 12 oz (124.626 kg)  07/15/15 276 lb 3.2 oz (125.283 kg)    Physical Exam  Constitutional: He appears well-developed and well-nourished. No distress.  Musculoskeletal: He exhibits no edema.  Skin: Skin is warm and dry. Rash noted. No erythema.  Dry rough patch on left elbow. Bilateral forearms with dry skin, several abrasions and small ecchymoses L lateral forearm. No surrounding erythema or induratino.  Nursing note and vitals reviewed.  Results for orders placed or performed in visit on 09/16/15  POCT INR  Result Value Ref Range   INR 2.9       Assessment & Plan:   Problem List Items Addressed This Visit    Skin abrasion - Primary    Anticipate self inflicted abrasions from scratching of worsening mild xerosis of forearms. Rec avoid showers with too hot water, discussed importance of regular moisturizing daily, discussed avoiding scratching to prevent superinfection. For dry patch on elbow - ?eczema - trial TCI cream sent to pharmacy.      Acute confusional  state    Wife endorses marked improvement with decreased narcotic use.          Follow up plan: No Follow-up on file.

## 2015-10-15 NOTE — Progress Notes (Signed)
Pre visit review using our clinic review tool, if applicable. No additional management support is needed unless otherwise documented below in the visit note. 

## 2015-10-15 NOTE — Assessment & Plan Note (Signed)
Anticipate self inflicted abrasions from scratching of worsening mild xerosis of forearms. Rec avoid showers with too hot water, discussed importance of regular moisturizing daily, discussed avoiding scratching to prevent superinfection. For dry patch on elbow - ?eczema - trial TCI cream sent to pharmacy.

## 2015-10-21 ENCOUNTER — Other Ambulatory Visit: Payer: Self-pay | Admitting: Family Medicine

## 2015-10-28 ENCOUNTER — Ambulatory Visit (INDEPENDENT_AMBULATORY_CARE_PROVIDER_SITE_OTHER): Payer: PPO | Admitting: *Deleted

## 2015-10-28 ENCOUNTER — Ambulatory Visit: Payer: PPO

## 2015-10-28 DIAGNOSIS — Z5181 Encounter for therapeutic drug level monitoring: Secondary | ICD-10-CM | POA: Diagnosis not present

## 2015-10-28 DIAGNOSIS — Z86718 Personal history of other venous thrombosis and embolism: Secondary | ICD-10-CM

## 2015-10-28 LAB — POCT INR: INR: 2.5

## 2015-10-28 NOTE — Progress Notes (Signed)
Pre visit review using our clinic review tool, if applicable. No additional management support is needed unless otherwise documented below in the visit note. 

## 2015-11-17 DIAGNOSIS — Z79891 Long term (current) use of opiate analgesic: Secondary | ICD-10-CM | POA: Diagnosis not present

## 2015-11-17 DIAGNOSIS — G894 Chronic pain syndrome: Secondary | ICD-10-CM | POA: Diagnosis not present

## 2015-11-17 DIAGNOSIS — M5136 Other intervertebral disc degeneration, lumbar region: Secondary | ICD-10-CM | POA: Diagnosis not present

## 2015-12-09 ENCOUNTER — Ambulatory Visit (INDEPENDENT_AMBULATORY_CARE_PROVIDER_SITE_OTHER): Payer: PPO | Admitting: *Deleted

## 2015-12-09 DIAGNOSIS — Z5181 Encounter for therapeutic drug level monitoring: Secondary | ICD-10-CM

## 2015-12-09 DIAGNOSIS — Z86718 Personal history of other venous thrombosis and embolism: Secondary | ICD-10-CM

## 2015-12-09 LAB — POCT INR: INR: 2.7

## 2015-12-09 NOTE — Progress Notes (Signed)
Pre visit review using our clinic review tool, if applicable. No additional management support is needed unless otherwise documented below in the visit note. 

## 2015-12-09 NOTE — Progress Notes (Signed)
INR remains therapeutic.  No changes to therapy.  

## 2015-12-14 ENCOUNTER — Other Ambulatory Visit: Payer: Self-pay | Admitting: Family Medicine

## 2015-12-27 ENCOUNTER — Other Ambulatory Visit: Payer: Self-pay | Admitting: Family Medicine

## 2015-12-28 NOTE — Telephone Encounter (Signed)
Last office visit 10/15/15/acute See allergy/contraindication Is it okay to refill?

## 2016-01-09 ENCOUNTER — Encounter: Payer: Self-pay | Admitting: Emergency Medicine

## 2016-01-09 ENCOUNTER — Emergency Department: Payer: PPO

## 2016-01-09 ENCOUNTER — Emergency Department
Admission: EM | Admit: 2016-01-09 | Discharge: 2016-01-09 | Disposition: A | Discharge status: Home / Self Care | Payer: PPO | Attending: Emergency Medicine | Admitting: Emergency Medicine

## 2016-01-09 DIAGNOSIS — R4702 Dysphasia: Secondary | ICD-10-CM | POA: Insufficient documentation

## 2016-01-09 DIAGNOSIS — Z7952 Long term (current) use of systemic steroids: Secondary | ICD-10-CM | POA: Insufficient documentation

## 2016-01-09 DIAGNOSIS — R0989 Other specified symptoms and signs involving the circulatory and respiratory systems: Secondary | ICD-10-CM | POA: Insufficient documentation

## 2016-01-09 DIAGNOSIS — E119 Type 2 diabetes mellitus without complications: Secondary | ICD-10-CM | POA: Diagnosis not present

## 2016-01-09 DIAGNOSIS — Z79899 Other long term (current) drug therapy: Secondary | ICD-10-CM | POA: Diagnosis not present

## 2016-01-09 DIAGNOSIS — T18128A Food in esophagus causing other injury, initial encounter: Secondary | ICD-10-CM | POA: Diagnosis not present

## 2016-01-09 DIAGNOSIS — I10 Essential (primary) hypertension: Secondary | ICD-10-CM | POA: Insufficient documentation

## 2016-01-09 DIAGNOSIS — R1310 Dysphagia, unspecified: Secondary | ICD-10-CM | POA: Diagnosis not present

## 2016-01-09 DIAGNOSIS — Z7901 Long term (current) use of anticoagulants: Secondary | ICD-10-CM | POA: Insufficient documentation

## 2016-01-09 DIAGNOSIS — R0789 Other chest pain: Secondary | ICD-10-CM | POA: Diagnosis not present

## 2016-01-09 DIAGNOSIS — K228 Other specified diseases of esophagus: Secondary | ICD-10-CM

## 2016-01-09 DIAGNOSIS — R198 Other specified symptoms and signs involving the digestive system and abdomen: Secondary | ICD-10-CM

## 2016-01-09 DIAGNOSIS — R1013 Epigastric pain: Secondary | ICD-10-CM | POA: Diagnosis not present

## 2016-01-09 LAB — CBC
HCT: 42.1 % (ref 40.0–52.0)
HEMOGLOBIN: 13.9 g/dL (ref 13.0–18.0)
MCH: 29.7 pg (ref 26.0–34.0)
MCHC: 33.1 g/dL (ref 32.0–36.0)
MCV: 89.9 fL (ref 80.0–100.0)
Platelets: 158 10*3/uL (ref 150–440)
RBC: 4.68 MIL/uL (ref 4.40–5.90)
RDW: 13.9 % (ref 11.5–14.5)
WBC: 8.4 10*3/uL (ref 3.8–10.6)

## 2016-01-09 LAB — TROPONIN I: Troponin I: <0.03 ng/mL (ref <0.031)

## 2016-01-09 LAB — BASIC METABOLIC PANEL
Anion gap: 7 (ref 5–15)
BUN: 15 mg/dL (ref 6–20)
CHLORIDE: 102 mmol/L (ref 101–111)
CO2: 30 mmol/L (ref 22–32)
CREATININE: 1.13 mg/dL (ref 0.61–1.24)
Calcium: 8.7 mg/dL — ABNORMAL LOW (ref 8.9–10.3)
GFR calc Af Amer: >60 mL/min (ref >60)
GFR calc non Af Amer: >60 mL/min (ref >60)
GLUCOSE: 128 mg/dL — AB (ref 65–99)
Potassium: 4.5 mmol/L (ref 3.5–5.1)
SODIUM: 139 mmol/L (ref 135–145)

## 2016-01-09 NOTE — ED Provider Notes (Signed)
ED ECG REPORT I, Jene EveryKINNER, Jakob Kimberlin, the attending physician, personally viewed and interpreted this ECG.  Date: 01/09/2016 EKG Time: 4:17 PM Rate: 55 Rhythm: Sinus bradycardia QRS Axis: normal Intervals: normal ST/T Wave abnormalities: normal Conduction Disturbances: none Narrative Interpretation: Sinus bradycardia   Jene Everyobert Madysin Crisp, MD 01/09/16 1622

## 2016-01-09 NOTE — Discharge Instructions (Signed)
Nonspecific Chest Pain  °Chest pain can be caused by many different conditions. There is always a chance that your pain could be related to something serious, such as a heart attack or a blood clot in your lungs. Chest pain can also be caused by conditions that are not life-threatening. If you have chest pain, it is very important to follow up with your health care provider. °CAUSES  °Chest pain can be caused by: °· Heartburn. °· Pneumonia or bronchitis. °· Anxiety or stress. °· Inflammation around your heart (pericarditis) or lung (pleuritis or pleurisy). °· A blood clot in your lung. °· A collapsed lung (pneumothorax). It can develop suddenly on its own (spontaneous pneumothorax) or from trauma to the chest. °· Shingles infection (varicella-zoster virus). °· Heart attack. °· Damage to the bones, muscles, and cartilage that make up your chest wall. This can include: °¨ Bruised bones due to injury. °¨ Strained muscles or cartilage due to frequent or repeated coughing or overwork. °¨ Fracture to one or more ribs. °¨ Sore cartilage due to inflammation (costochondritis). °RISK FACTORS  °Risk factors for chest pain may include: °· Activities that increase your risk for trauma or injury to your chest. °· Respiratory infections or conditions that cause frequent coughing. °· Medical conditions or overeating that can cause heartburn. °· Heart disease or family history of heart disease. °· Conditions or health behaviors that increase your risk of developing a blood clot. °· Having had chicken pox (varicella zoster). °SIGNS AND SYMPTOMS °Chest pain can feel like: °· Burning or tingling on the surface of your chest or deep in your chest. °· Crushing, pressure, aching, or squeezing pain. °· Dull or sharp pain that is worse when you move, cough, or take a deep breath. °· Pain that is also felt in your back, neck, shoulder, or arm, or pain that spreads to any of these areas. °Your chest pain may come and go, or it may stay  constant. °DIAGNOSIS °Lab tests or other studies may be needed to find the cause of your pain. Your health care provider may have you take a test called an ambulatory ECG (electrocardiogram). An ECG records your heartbeat patterns at the time the test is performed. You may also have other tests, such as: °· Transthoracic echocardiogram (TTE). During echocardiography, sound waves are used to create a picture of all of the heart structures and to look at how blood flows through your heart. °· Transesophageal echocardiogram (TEE). This is a more advanced imaging test that obtains images from inside your body. It allows your health care provider to see your heart in finer detail. °· Cardiac monitoring. This allows your health care provider to monitor your heart rate and rhythm in real time. °· Holter monitor. This is a portable device that records your heartbeat and can help to diagnose abnormal heartbeats. It allows your health care provider to track your heart activity for several days, if needed. °· Stress tests. These can be done through exercise or by taking medicine that makes your heart beat more quickly. °· Blood tests. °· Imaging tests. °TREATMENT  °Your treatment depends on what is causing your chest pain. Treatment may include: °· Medicines. These may include: °¨ Acid blockers for heartburn. °¨ Anti-inflammatory medicine. °¨ Pain medicine for inflammatory conditions. °¨ Antibiotic medicine, if an infection is present. °¨ Medicines to dissolve blood clots. °¨ Medicines to treat coronary artery disease. °· Supportive care for conditions that do not require medicines. This may include: °¨ Resting. °¨ Applying heat   or cold packs to injured areas.  Limiting activities until pain decreases. HOME CARE INSTRUCTIONS  If you were prescribed an antibiotic medicine, finish it all even if you start to feel better.  Avoid any activities that bring on chest pain.  Do not use any tobacco products, including  cigarettes, chewing tobacco, or electronic cigarettes. If you need help quitting, ask your health care provider.  Do not drink alcohol.  Take medicines only as directed by your health care provider.  Keep all follow-up visits as directed by your health care provider. This is important. This includes any further testing if your chest pain does not go away.  If heartburn is the cause for your chest pain, you may be told to keep your head raised (elevated) while sleeping. This reduces the chance that acid will go from your stomach into your esophagus.  Make lifestyle changes as directed by your health care provider. These may include:  Getting regular exercise. Ask your health care provider to suggest some activities that are safe for you.  Eating a heart-healthy diet. A registered dietitian can help you to learn healthy eating options.  Maintaining a healthy weight.  Managing diabetes, if necessary.  Reducing stress. SEEK MEDICAL CARE IF:  Your chest pain does not go away after treatment.  You have a rash with blisters on your chest.  You have a fever. SEEK IMMEDIATE MEDICAL CARE IF:   Your chest pain is worse.  You have an increasing cough, or you cough up blood.  You have severe abdominal pain.  You have severe weakness.  You faint.  You have chills.  You have sudden, unexplained chest discomfort.  You have sudden, unexplained discomfort in your arms, back, neck, or jaw.  You have shortness of breath at any time.  You suddenly start to sweat, or your skin gets clammy.  You feel nauseous or you vomit.  You suddenly feel light-headed or dizzy.  Your heart begins to beat quickly, or it feels like it is skipping beats. These symptoms may represent a serious problem that is an emergency. Do not wait to see if the symptoms will go away. Get medical help right away. Call your local emergency services (911 in the [place] Do not drive yourself to the hospital.   This  information is not intended to replace advice given to you by your health care provider. Make sure you discuss any questions you have with your health care provider.   Document Released: 08/02/2005 Document Revised: 11/13/2014 Document Reviewed: 05/29/2014 Elsevier Interactive Patient Education 2016 Elsevier Inc.     Dysphagia  Swallowing problems (dysphagia) occur when solids and liquids seem to stick in your throat on the way down to your stomach, or the food takes longer to get to the stomach. Other symptoms include regurgitating food, noises coming from the throat, chest discomfort with swallowing, and a feeling of fullness or the feeling of something being stuck in your throat when swallowing. When blockage in your throat is complete, it may be associated with drooling.  CAUSES  Problems with swallowing may occur because of problems with the muscles. The food cannot be propelled in the usual manner into your stomach. You may have ulcers, scar tissue, or inflammation in the tube down which food travels from your mouth to your stomach (esophagus), which blocks food from passing normally into the stomach. Causes of inflammation include:  Acid reflux from your stomach into your esophagus.  Infection.  Radiation treatment for cancer.  Medicines  taken without enough fluids to wash them down into your stomach. You may have nerve problems that prevent signals from being sent to the muscles of your esophagus to contract and move your food down to your stomach. Globus pharyngeus is a relatively common problem in which there is a sense of an obstruction or difficulty in swallowing, without any physical abnormalities of the swallowing passages being found. This problem usually improves over time with reassurance and testing to rule out other causes.  DIAGNOSIS  Dysphagia can be diagnosed and its cause can be determined by tests in which you swallow a white substance that helps illuminate the inside of  your throat (contrast medium) while X-rays are taken. Sometimes a flexible telescope that is inserted down your throat (endoscopy) to look at your esophagus and stomach is used.  TREATMENT  If the dysphagia is caused by acid reflux or infection, medicines may be used.  If the dysphagia is caused by problems with your swallowing muscles, swallowing therapy may be used to help you strengthen your swallowing muscles.  If the dysphagia is caused by a blockage or mass, procedures to remove the blockage may be done. HOME CARE INSTRUCTIONS  Try to eat soft food that is easier to swallow and check your weight on a daily basis to be sure that it is not decreasing.  Be sure to drink liquids when sitting upright (not lying down). SEEK MEDICAL CARE IF:  You are losing weight because you are unable to swallow.  You are coughing when you drink liquids (aspiration).  You are coughing up partially digested food. SEEK IMMEDIATE MEDICAL CARE IF:  You are unable to swallow your own saliva .  You are having shortness of breath or a fever, or both.  You have a hoarse voice along with difficulty swallowing. MAKE SURE YOU:  Understand these instructions.  Will watch your condition.  Will get help right away if you are not doing well or get worse. This information is not intended to replace advice given to you by your health care provider. Make sure you discuss any questions you have with your health care provider.  Document Released: 10/20/2000 Document Revised: 11/13/2014 Document Reviewed: 04/11/2013  Elsevier Interactive Patient Education [employer]2016 Elsevier Inc.

## 2016-01-09 NOTE — ED Notes (Signed)
Denies SHOB.  

## 2016-01-09 NOTE — ED Notes (Addendum)
Pt was eating chicken and feels like a piece of chicken is choking him and stuck in esophagus somewhere.  Pt reports worse on way over but it feels a little better.  No respiratory distress.  Controlling secretions.

## 2016-01-09 NOTE — ED Notes (Signed)
NAD noted at time of D/C. Pt denies questions or concerns. Pt ambulatory to the lobby at this time.  

## 2016-01-09 NOTE — ED Notes (Signed)
Pt to Xray.

## 2016-01-09 NOTE — ED Provider Notes (Signed)
CSN: [id number]     Arrival date & time 01/09/16  1451 History   First MD Initiated Contact with Patient 01/09/16 1532     Chief Complaint  Patient presents with  . Foreign Body     (Consider location/radiation/quality/duration/timing/severity/associated sxs/prior Treatment) HPI  70 year old male presents emergency department for evaluation of foreign body in throat. Patient states he was at [facility] eating a piece of boneless chicken when after eating, standing up he felt pressure in his chest substernally. Patient's pain lasted 10-15 minutes, but time he arrived to the emergency Department pain had resolved. He denies any other history of chest pain. No chest pain with exertion. There is no radiation of the pain. Patient has been able to swallow freely without any issues since the episode of pain. Patient's feels as if his pain was coming from a food bolus, he has not had any issues with dysphagia in the past. He has suffered from a stroke in 2009 and is currently on Coumadin.  Past Medical History  Diagnosis Date  . Obesity   . Dysmetabolic syndrome X   . Gout, unspecified   . Unspecified sleep apnea   . Mixed hyperlipidemia   . Type II or unspecified type diabetes mellitus without mention of complication, uncontrolled   . History of pulmonary embolism   . History of DVT (deep vein thrombosis)   . History of thrombophlebitis   . HTN (hypertension)   . History of stroke 2009    [place]  . PFO (patent foramen ovale)   . Chronic lower back pain     [place]  . DDD (degenerative disc disease), lumbar   . Primary hypercoagulable state (HCC) 05/14/2008    Personal h/o DVT, PE, PFO, and lupus AC +    Past Surgical History  Procedure Laterality Date  . Wrist surgery      Right wrist repair post trauma  . Colonoscopy  2000    [facility]   Family History  Problem Relation Age of Onset  . Emphysema Father   . Diabetes Brother   . Hypertension Brother   . Heart attack Brother 43  . Skin  cancer Brother   . Lung cancer Brother   . Cancer Maternal Aunt     ? Type   . Stroke Neg Hx    Social History  Substance Use Topics  . Smoking status: Never Smoker   . Smokeless tobacco: Never Used  . Alcohol Use: No    Review of Systems  Constitutional: Negative.  Negative for fever, chills, activity change and appetite change.  HENT: Negative for congestion, ear pain, mouth sores, rhinorrhea, sinus pressure, sore throat and trouble swallowing.   Eyes: Negative for photophobia, pain and discharge.  Respiratory: Negative for cough, choking, chest tightness and shortness of breath.   Cardiovascular: Positive for chest pain. Negative for leg swelling.  Gastrointestinal: Negative for nausea, vomiting, abdominal pain, diarrhea and abdominal distention.  Genitourinary: Negative for dysuria and difficulty urinating.  Musculoskeletal: Negative for back pain, arthralgias and gait problem.  Skin: Negative for color change and rash.  Neurological: Negative for dizziness and headaches.  Hematological: Negative for adenopathy.  Psychiatric/Behavioral: Negative for behavioral problems and agitation.      Allergies  Hydrochlorothiazide; Indomethacin; Pravastatin sodium; Lisinopril; and Micardis  Home Medications   Prior to Admission medications   Medication Sig Start Date End Date Taking? Authorizing Provider  acetaminophen (TYLENOL) 500 MG tablet Take 500 mg by mouth 3 (three) times daily.  Historical Provider, MD  atorvastatin (LIPITOR) 20 MG tablet TAKE 1 TABLET BY MOUTH DAILY 12/28/15   Eustaquio Boyden, MD  docusate sodium (COLACE) 100 MG capsule Take 100 mg by mouth daily as needed for mild constipation.    Historical Provider, MD  gabapentin (NEURONTIN) 300 MG capsule TAKE ONE CAPSULE BY MOUTH THREE TIMES DAILY 08/13/15   Eustaquio Boyden, MD  HYDROcodone-acetaminophen [facility]) 10-325 MG per tablet Take 1 tablet by mouth every 6 (six) hours as needed.      Historical Provider, MD   L-Methylfolate-B6-B12 [facility]) 3-35-2 MG TABS TAKE 1 TABLET BY MOUTH TWICE A DAY 08/02/15   Nilda Riggs, NP  memantine (NAMENDA) 10 MG tablet TAKE 1 TABLET BY MOUTH TWICE A DAY 10/04/15   Nilda Riggs, NP  metoprolol tartrate (LOPRESSOR) 25 MG tablet TAKE 1 TABLET BY MOUTH TWICE A DAY 10/22/15   Eustaquio Boyden, MD  polyethylene glycol powder (GLYCOLAX/MIRALAX) powder Take 17 g by mouth daily as needed. 04/12/15   Eustaquio Boyden, MD  potassium gluconate 595 MG TABS tablet Take 595 mg by mouth daily.    Historical Provider, MD  triamcinolone cream (KENALOG) 0.1 % Apply 1 application topically 2 (two) times daily. Apply to AA. 10/15/15   Eustaquio Boyden, MD  warfarin (COUMADIN) 5 MG tablet TAKE AS DIRECTED BY COUMADIN CLINIC 12/14/15   Eustaquio Boyden, MD   BP 149/71 mmHg  Pulse 68  Temp(Src) 98.1 F (36.7 C) (Oral)  Resp 19  Ht  (1.702 m)  Wt 122.471 kg  BMI 42.28 kg/m2  SpO2 98% Physical Exam  Constitutional: He is oriented to person, place, and time. He appears well-developed and well-nourished.  HENT:  Head: Normocephalic and atraumatic.  Eyes: Conjunctivae and EOM are normal. Pupils are equal, round, and reactive to light.  Neck: Normal range of motion. Neck supple.  Cardiovascular: Normal rate, regular rhythm, normal heart sounds and intact distal pulses.   Pulmonary/Chest: Effort normal and breath sounds normal. No respiratory distress. He has no wheezes. He has no rales. He exhibits no tenderness.  Abdominal: Soft. Bowel sounds are normal. He exhibits no distension. There is no tenderness.  Musculoskeletal: Normal range of motion. He exhibits no edema or tenderness.  Neurological: He is alert and oriented to person, place, and time.  Skin: Skin is warm and dry.  Psychiatric: He has a normal mood and affect. His behavior is normal. Judgment and thought content normal.    ED Course  Procedures (including critical care time) Labs Review Labs Reviewed   BASIC METABOLIC PANEL - Abnormal; Notable for the following:    Glucose, Bld 128 (*)    Calcium 8.7 (*)    All other components within normal limits  CBC  TROPONIN I  TROPONIN I    Imaging Review Dg Chest 2 View  01/09/2016  CLINICAL DATA:  Epigastric pain after eating lunch at [employer]. EXAM: CHEST  2 VIEW COMPARISON:  None. FINDINGS: The heart size and mediastinal contours are within normal limits. Both lungs are clear. The visualized skeletal structures are unremarkable. IMPRESSION: No active cardiopulmonary disease. Electronically Signed   By: Signa Kell M.D.   On: 01/09/2016 16:18   I have personally reviewed and evaluated these images and lab results as part of my medical decision-making.   EKG Interpretation None      MDM   Final diagnoses:  Other chest pain  Sensation of foreign body in esophagus  Dysphasia    70 year old male with pain in  the chest after swallowing food. Substernal chest pain was described as pressure, no history of dysphasia in the past. Chest x-ray negative. Patient currently tolerating by mouth well. Serial troponins negative. EKG normal. Patient was educated on dysphasia as well as chest pain. He will follow-up with PCP or GI doctor is episodes of dysphasia persist. He is educated on red flags to return to the ED for. He will return to the ER for any worsening symptoms urgent changes in his health.    Evon Slackhomas C Shiza Thelen, PA-C 01/09/16 1908  Sharman CheekPhillip Stafford, MD 01/09/16 2251

## 2016-01-12 ENCOUNTER — Encounter: Payer: Self-pay | Admitting: Nurse Practitioner

## 2016-01-12 ENCOUNTER — Ambulatory Visit (INDEPENDENT_AMBULATORY_CARE_PROVIDER_SITE_OTHER): Payer: PPO | Admitting: Nurse Practitioner

## 2016-01-12 VITALS — BP 120/84 | HR 62 | Resp 20 | Ht 67.0 in | Wt 276.8 lb

## 2016-01-12 DIAGNOSIS — Z8673 Personal history of transient ischemic attack (TIA), and cerebral infarction without residual deficits: Secondary | ICD-10-CM | POA: Diagnosis not present

## 2016-01-12 DIAGNOSIS — F0150 Vascular dementia without behavioral disturbance: Secondary | ICD-10-CM | POA: Diagnosis not present

## 2016-01-12 DIAGNOSIS — I10 Essential (primary) hypertension: Secondary | ICD-10-CM

## 2016-01-12 MED ORDER — MEMANTINE HCL 10 MG PO TABS: 10.0000 mg | ORAL_TABLET | Freq: Two times a day (BID) | ORAL | Status: DC

## 2016-01-12 NOTE — Progress Notes (Signed)
[facility]  PATIENT: Logan Valdez. 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 He has risk factors of diabetes and hypertension obesity and hyperlipidemia. At the time of his stroke he was found to have a PFO. He elected not to have it closed.  Update 06/10/2014:Mr. 16, 70 year old male returns for followup. Last seen in this office 2/5/ 2015 Carotid Doppler revealed 50-69% stenosis involving the left proximal ICA in September 2014. He states he is doing well and still has mild short-term memory difficulties which appear to be unchanged. Patient is currently taking Namenda XR 28 mg and he is tolerating without difficulty. His memory is stable .He has no new neurologic complaints, he returns for reevaluation. Due to chronic back pain he gets no exercise. He has not had further stroke TIA symptoms. He remains on Coumadin with minimal bruising.   Update 07/15/2015 : He returns for follow-up today complaint by his wife. The patient was unable to afford Namenda XR. 28 mg and hence switched back to Namenda 10 mg twice daily. He is tolerating it well but wife feels subjectively maybe a little bit worse. He continues to have memory and cognitive difficulties but under wife's supervision he does fine. He is mainly bothered by severe low back pain but has not been considered a surgical candidate. He has limited mobility and does not exercise a lot but has lost some weight approximately 13 pounds. He goes to pain management He remains on warfarin which is tolerating well with only minor bruising and no significant bleeding episodes. He had carotid ultrasound done on 07/15/14 show 50-69% left ICA stenosis. He has had no recurrent stroke or TIA symptoms. He is  tolerating Lipitor well without any side effects. Last lipid profile was in February . Blood pressure is mildly elevated in the office today 142/78 UPDATE 01/12/16 Mr. 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 He has risk factors of diabetes and hypertension obesity and hyperlipidemia. He also has a vascular dementia and is currently on Namenda twice daily. His memory is stable. He continues to go to pain management. He is on warfarin. Last carotid ultrasound in September 2015 shows 50-69% left ICA stenosis. He needs repeat. He has not had recurrent stroke or TIA symptoms. Blood  pressure in good control 120/84 in the  office. Lipids are followed by his primary care and he remains on Lipitor. No falls since last seen. He returns for reevaluation  REVIEW OF SYSTEMS: Full 14 system review of systems performed and notable only for those listed, all others are neg:  Constitutional: neg  Cardiovascular: neg Ear/Nose/Throat: Hearing loss  Skin: neg Eyes: neg Respiratory: neg Gastroitestinal: neg  Hematology/Lymphatic: neg  Endocrine: neg Musculoskeletal: Chronic back pain Allergy/Immunology: neg Neurological: neg Psychiatric: neg Sleep : neg   ALLERGIES: Allergies  Allergen Reactions  . Hydrochlorothiazide   . Indomethacin     syncope  . Pravastatin Sodium     REACTION: rash 11/09  . Lisinopril     REACTION: HA  . Micardis [Telmisartan]     REACTION: HA    HOME MEDICATIONS: Outpatient Prescriptions Prior to Visit  Medication Sig Dispense Refill  . acetaminophen (TYLENOL) 500 MG tablet Take 500 mg by mouth 3 (  three) times daily.    Marland Kitchen. atorvastatin (LIPITOR) 20 MG tablet TAKE 1 TABLET BY MOUTH DAILY 30 tablet 3  . docusate sodium (COLACE) 100 MG capsule Take 100 mg by mouth daily as needed for mild constipation.    . gabapentin (NEURONTIN) 300 MG capsule TAKE ONE CAPSULE BY MOUTH THREE TIMES DAILY 90  capsule 11  . HYDROcodone-acetaminophen (NORCO) 10-325 MG per tablet Take 1 tablet by mouth every 6 (six) hours as needed.      Marland Kitchen. L-Methylfolate-B6-B12 (FOLTANX) 3-35-2 MG TABS TAKE 1 TABLET BY MOUTH TWICE A DAY 180 tablet 1  . memantine (NAMENDA) 10 MG tablet TAKE 1 TABLET BY MOUTH TWICE A DAY 60 tablet 3  . metoprolol tartrate (LOPRESSOR) 25 MG tablet TAKE 1 TABLET BY MOUTH TWICE A DAY 180 tablet 3  . polyethylene glycol powder (GLYCOLAX/MIRALAX) powder Take 17 g by mouth daily as needed. 3350 g 1  . potassium gluconate 595 MG TABS tablet Take 595 mg by mouth daily.    Marland Kitchen. triamcinolone cream (KENALOG) 0.1 % Apply 1 application topically 2 (two) times daily. Apply to AA. 30 g 0  . warfarin (COUMADIN) 5 MG tablet TAKE AS DIRECTED BY COUMADIN CLINIC 90 tablet 0   No facility-administered medications prior to visit.    PAST MEDICAL HISTORY: Past Medical History  Diagnosis Date  . Obesity   . Dysmetabolic syndrome X   . Gout, unspecified   . Unspecified sleep apnea   . Mixed hyperlipidemia   . Type II or unspecified type diabetes mellitus without mention of complication, uncontrolled   . History of pulmonary embolism   . History of DVT (deep vein thrombosis)   . History of thrombophlebitis   . HTN (hypertension)   . History of stroke 2009    Sethi  . PFO (patent foramen ovale)   . Chronic lower back pain     Ramos  . DDD (degenerative disc disease), lumbar   . Primary hypercoagulable state (HCC) 05/14/2008    Personal h/o DVT, PE, PFO, and lupus AC +     PAST SURGICAL HISTORY: Past Surgical History  Procedure Laterality Date  . Wrist surgery      Right wrist repair post trauma  . Colonoscopy  2000    [facility]    FAMILY HISTORY: Family History  Problem Relation Age of Onset  . Emphysema Father   . Diabetes Brother   . Hypertension Brother   . Heart attack Brother 43  . Skin cancer Brother   . Lung cancer Brother   . Cancer Maternal Aunt     ? Type   . Stroke Neg Hx      SOCIAL HISTORY: Social History   Social History  . Marital Status: Married    Spouse Name: N/A  . Number of Children: 4  . Years of Education: 8 th   Occupational History  . Not on file.   Social History Main Topics  . Smoking status: Never Smoker   . Smokeless tobacco: Never Used  . Alcohol Use: No  . Drug Use: No  . Sexual Activity: Not on file   Other Topics Concern  . Not on file   Social History Narrative   Patient is married with 4 children.   Patient is right handed.   Patient has 8 th grade education.   Patient drinks 4-5 cups daily.     PHYSICAL EXAM  Filed Vitals:   01/12/16 1316  BP: 120/84  Pulse: 62  Resp: 20  Height:  (1.702 m)  Weight: 276 lb 12.8 oz (125.556 kg)   Body mass index is 43.34 kg/(m^2). Generalized: Well developed, obese male in no acute distress  Head: normocephalic and atraumatic,. Oropharynx benign  Neck: Supple, no carotid bruits  Cardiac: Regular rate rhythm, no murmur  Musculoskeletal: No deformity  Neurological examination  Mentation: Alert oriented to time, place, history taking. MMSE 19/30. Last MMSE 18/30 missing items in orientation, calculation , unable to write a sentence or copy a figure. AFT 9. Clock drawing 0/4. Follows all commands speech and language fluent  Cranial nerve II-XII: Pupils were equal round reactive to light extraocular movements were full, visual field show partial right homonymous hemianopsia on confrontational test. Facial sensation and strength were normal. Hard of hearing. Uvula tongue midline. head turning and shoulder shrug were normal and symmetric.Tongue protrusion into cheek strength was normal.  Motor: normal bulk and tone, full strength in the BUE, BLE, fine finger movements normal, no pronator drift. No focal weakness  Sensory: normal and symmetric to light touch, pinprick, and vibration  Coordination: finger-nose-finger, heel-to-shin bilaterally, no dysmetria  Reflexes: 1+ upper  lower and symmetric  Gait and Station: Rising up from seated position without assistance, normal stance, moderate stride, good arm swing, smooth turning, able to perform tiptoe, and heel walking without difficulty. Tandem gait is steady no assistive device   DIAGNOSTIC DATA (LABS, IMAGING, TESTING) - I reviewed patient records, labs, notes, testing and imaging myself where available.  Lab Results  Component Value Date   WBC 8.4 01/09/2016   HGB 13.9 01/09/2016   HCT 42.1 01/09/2016   MCV 89.9 01/09/2016   PLT 158 01/09/2016      Component Value Date/Time   NA 139 01/09/2016 1632   K 4.5 01/09/2016 1632   CL 102 01/09/2016 1632   CO2 30 01/09/2016 1632   GLUCOSE 128* 01/09/2016 1632   BUN 15 01/09/2016 1632   CREATININE 1.13 01/09/2016 1632   CALCIUM 8.7* 01/09/2016 1632   PROT 6.4* 08/31/2015 1150   ALBUMIN 3.6 08/31/2015 1150   AST 28 08/31/2015 1150   ALT 22 08/31/2015 1150   ALKPHOS 59 08/31/2015 1150   BILITOT 1.1 08/31/2015 1150   GFRNONAA >60 01/09/2016 1632   GFRAA >60 01/09/2016 1632        ASSESSMENT AND PLAN 70 y.o. year old male has a past medical history of Obesity; Dysmetabolic syndrome X; Unspecified sleep apnea; Mixed hyperlipidemia; Type II or unspecified type diabetes mellitus without mention of complication, uncontrolled; History of pulmonary embolism; History of DVT (deep vein thrombosis); History of thrombophlebitis; HTN (hypertension); History of stroke (2009); PFO (patent foramen ovale); Chronic lower back pain; DDD (degenerative disc disease), lumbar; and Primary hypercoagulable state (05/14/2008). here to follow-up.The patient is a current patient of Dr. [name] who is out of the office today . This note is sent to the work in doctor.   Continue Namenda 10 mg twice daily Continue Coumadin for secondary stroke prevention, history of pulmonary embolus and hypercoagulable state Hemoglobin A1c goal below 6.5,  lipids with LDL cholesterol goal below  70 mg percent  hypertension with blood pressure goal below 130/90. Today's reading 120/84 Continue healthy diet and weight loss for overall health and well-being Follow-up carotid Doppler to be scheduled  Follow-up in 6 months Nilda Riggs, [facility], [facility], APRN  [facility] [address] [place], [place] [address] [phone]

## 2016-01-12 NOTE — Patient Instructions (Addendum)
Continue Namenda 10 mg twice daily will refill Continue Coumadin for secondary stroke prevention, history of pulmonary embolus Hemoglobin A1c goal below 6.5,  lipids with LDL cholesterol goal below 70 mg percent  hypertension with blood pressure goal below 130/90. Today's reading 120/84  Continue healthy diet and weight loss for overall health and well-being Follow-up carotid Doppler to be scheduled  Follow-up in 6 months

## 2016-01-12 NOTE — Progress Notes (Signed)
I reviewed above note and agree with the assessment and plan.  Marvel PlanJindong Tamla Winkels, MD PhD Stroke Neurology 01/12/2016 6:09 PM

## 2016-01-17 ENCOUNTER — Ambulatory Visit (INDEPENDENT_AMBULATORY_CARE_PROVIDER_SITE_OTHER): Payer: PPO | Admitting: *Deleted

## 2016-01-17 DIAGNOSIS — Z86718 Personal history of other venous thrombosis and embolism: Secondary | ICD-10-CM | POA: Diagnosis not present

## 2016-01-17 DIAGNOSIS — Z5181 Encounter for therapeutic drug level monitoring: Secondary | ICD-10-CM

## 2016-01-17 LAB — POCT INR: INR: 2.7

## 2016-01-17 NOTE — Progress Notes (Signed)
Pre visit review using our clinic review tool, if applicable. No additional management support is needed unless otherwise documented below in the visit note. 

## 2016-01-20 ENCOUNTER — Ambulatory Visit: Payer: PPO

## 2016-02-10 ENCOUNTER — Ambulatory Visit (INDEPENDENT_AMBULATORY_CARE_PROVIDER_SITE_OTHER): Payer: PPO

## 2016-02-10 DIAGNOSIS — Z8673 Personal history of transient ischemic attack (TIA), and cerebral infarction without residual deficits: Secondary | ICD-10-CM | POA: Diagnosis not present

## 2016-02-17 ENCOUNTER — Telehealth: Payer: Self-pay | Admitting: Nurse Practitioner

## 2016-02-17 NOTE — Telephone Encounter (Signed)
-----   Message from Butch PennyMegan Millikan, NP sent at 02/17/2016  7:57 AM EDT ----- No significant change in doppler study when compared to study 9/15. Please call the patient.

## 2016-02-17 NOTE — Telephone Encounter (Signed)
Called and spoke to Patient's wife relayed Doppler study compared to 09/15 no significant  change. Patient's wife was fine and understood.

## 2016-02-17 NOTE — Progress Notes (Signed)
Quick Note:  Called and spoke to patient's wife relayed no significant change from doppler 09/15. Patient's wife understood. ______

## 2016-02-28 ENCOUNTER — Ambulatory Visit (INDEPENDENT_AMBULATORY_CARE_PROVIDER_SITE_OTHER): Payer: PPO | Admitting: *Deleted

## 2016-02-28 DIAGNOSIS — Z86718 Personal history of other venous thrombosis and embolism: Secondary | ICD-10-CM | POA: Diagnosis not present

## 2016-02-28 DIAGNOSIS — Z5181 Encounter for therapeutic drug level monitoring: Secondary | ICD-10-CM

## 2016-02-28 LAB — POCT INR: INR: 2.6

## 2016-02-28 NOTE — Progress Notes (Signed)
Pre visit review using our clinic review tool, if applicable. No additional management support is needed unless otherwise documented below in the visit note. 

## 2016-03-19 ENCOUNTER — Other Ambulatory Visit: Payer: Self-pay | Admitting: Family Medicine

## 2016-03-23 ENCOUNTER — Other Ambulatory Visit: Payer: Self-pay | Admitting: Family Medicine

## 2016-03-29 ENCOUNTER — Ambulatory Visit: Payer: PPO

## 2016-04-05 ENCOUNTER — Ambulatory Visit: Payer: PPO

## 2016-04-06 DIAGNOSIS — M5136 Other intervertebral disc degeneration, lumbar region: Secondary | ICD-10-CM | POA: Diagnosis not present

## 2016-04-06 DIAGNOSIS — G894 Chronic pain syndrome: Secondary | ICD-10-CM | POA: Diagnosis not present

## 2016-04-06 DIAGNOSIS — G8929 Other chronic pain: Secondary | ICD-10-CM | POA: Diagnosis not present

## 2016-04-06 DIAGNOSIS — M5442 Lumbago with sciatica, left side: Secondary | ICD-10-CM | POA: Diagnosis not present

## 2016-04-10 ENCOUNTER — Ambulatory Visit (INDEPENDENT_AMBULATORY_CARE_PROVIDER_SITE_OTHER): Payer: PPO | Admitting: *Deleted

## 2016-04-10 DIAGNOSIS — Z86718 Personal history of other venous thrombosis and embolism: Secondary | ICD-10-CM | POA: Diagnosis not present

## 2016-04-10 DIAGNOSIS — Z5181 Encounter for therapeutic drug level monitoring: Secondary | ICD-10-CM | POA: Diagnosis not present

## 2016-04-10 LAB — POCT INR: INR: 2.4

## 2016-04-10 NOTE — Progress Notes (Signed)
Pre visit review using our clinic review tool, if applicable. No additional management support is needed unless otherwise documented below in the visit note. 

## 2016-04-14 ENCOUNTER — Ambulatory Visit: Payer: PPO | Admitting: Internal Medicine

## 2016-04-17 ENCOUNTER — Ambulatory Visit (INDEPENDENT_AMBULATORY_CARE_PROVIDER_SITE_OTHER): Payer: PPO | Admitting: Family Medicine

## 2016-04-17 ENCOUNTER — Encounter: Payer: Self-pay | Admitting: Family Medicine

## 2016-04-17 VITALS — BP 108/72 | HR 62 | Temp 98.4°F | Wt 282.5 lb

## 2016-04-17 DIAGNOSIS — R300 Dysuria: Secondary | ICD-10-CM | POA: Insufficient documentation

## 2016-04-17 LAB — POC URINALSYSI DIPSTICK (AUTOMATED)
Bilirubin, UA: NEGATIVE
GLUCOSE UA: NEGATIVE
Ketones, UA: NEGATIVE
LEUKOCYTES UA: NEGATIVE
Nitrite, UA: NEGATIVE
PH UA: 6
Protein, UA: NEGATIVE
RBC UA: NEGATIVE
UROBILINOGEN UA: 0.2

## 2016-04-17 MED ORDER — FINASTERIDE 5 MG PO TABS: 5.0000 mg | ORAL_TABLET | Freq: Every day | ORAL | Status: DC

## 2016-04-17 NOTE — Progress Notes (Signed)
Pre visit review using our clinic review tool, if applicable. No additional management support is needed unless otherwise documented below in the visit note.  Urinary sx.   Going on for "pretty good little while."  Slow starting of stream, then not a full stream.  Some dribbling.  Not burning with urination.  No blood in urine.  Sx gradually worse likely in the last few months.  H/o CVA but sx started after the fact.  He drinks a few cups of coffee a day.    Meds, vitals, and allergies reviewed.   ROS: Per HPI unless specifically indicated in ROS section   GEN: nad, alert and pleasant but some memory loss noted on exam, trouble with recall (Hx per family also) HEENT: mucous membranes moist NECK: supple w/o LA CV: rrr. PULM: ctab, no inc wob ABD: soft, +bs EXT: trace edema

## 2016-04-17 NOTE — Assessment & Plan Note (Signed)
Presumed LUTS from BPH.  I didn't check DRE or PSA today as neither would likely change mgmt.  Patient needs to consider with family if he would be willing to go though with prostate biopsy (off coumadin) before we check a PSA.  D/w pt and family.  I'll defer to PCP about this.  In meantime, taper caffeine, start finasteride with routine timeline of effect d/w pt.  I didn't us doxazosin given his current BP.  Okay for outpatient f/u. >25 minutes spent in face to face time with patient, >50% spent in counselling or coordination of care.

## 2016-04-17 NOTE — Patient Instructions (Signed)
Start finasteride and taper off caffeine.  Update Dr. Reece AgarG if not better.  Tapering off caffeine will help more initially than the medicine.  This is likely the best medicine to take for now, given your allergies and medical history.  Take care.  Glad to see you.

## 2016-05-04 ENCOUNTER — Other Ambulatory Visit: Payer: Self-pay | Admitting: Family Medicine

## 2016-05-05 ENCOUNTER — Telehealth: Payer: Self-pay | Admitting: Family Medicine

## 2016-05-05 NOTE — Telephone Encounter (Signed)
Pt has appt at Sat clinic 05/06/16 at 9:30 with Dr Claiborne BillingsKuneff.

## 2016-05-05 NOTE — Telephone Encounter (Signed)
[facility] - Client TELEPHONE ADVICE RECORD [facility] Patient Name: Ramon DredgeDWARD (E.C) THORNB URG DOB: 01/09/1946 Initial Comment Caller states brought husband in. Having trouble with his bladder. Was given medication. Lost control of his bladder. Have an appointment for a checkup October but wondering if they need to see someone sooner. Nurse Assessment Nurse: Josie SaundersGerard, RN, [facility] SquibbJane Date/Time Lamount Cohen(Eastern Time): 05/05/2016 4:21:47 PM Confirm and document reason for call. If symptomatic, describe symptoms. You must click the next button to save text entered. ---Caller reports her husband is on Finasteride 5 mg daily for bladder incontinence. Today had incontinence episode. Has trouble initiating urine stream and has poor control of stream when does flow. Has appointment in October. Is wondering if needs to be moved up. Has the patient traveled out of the country within the last 30 days? ---No Does the patient have any new or worsening symptoms? ---Yes Will a triage be completed? ---Yes Related visit to physician within the last 2 weeks? ---No Does the PT have any chronic conditions? (i.e. diabetes, asthma, etc.) ---Yes List chronic conditions. ---Stroke, back pain,( on Pain management ) Coumadin 2.5 mg on Wed. and 5 mg all other days. Hypertension Is this a behavioral health or substance abuse call? ---No Guidelines Guideline Title Affirmed Question Affirmed Notes Urinary Symptoms [1] Can't control passage of urine (i.e., urinary incontinence) AND [2] new onset (< 2 weeks) or worsening Final Disposition User See Physician within 24 Hours Josie SaundersGerard, RN, [facility] Referrals [facility]

## 2016-05-06 ENCOUNTER — Encounter: Payer: Self-pay | Admitting: Family Medicine

## 2016-05-06 ENCOUNTER — Ambulatory Visit (INDEPENDENT_AMBULATORY_CARE_PROVIDER_SITE_OTHER): Payer: PPO | Admitting: Family Medicine

## 2016-05-06 VITALS — BP 121/83 | HR 52 | Temp 98.2°F | Resp 16 | Wt 276.0 lb

## 2016-05-06 DIAGNOSIS — N3941 Urge incontinence: Secondary | ICD-10-CM | POA: Insufficient documentation

## 2016-05-06 DIAGNOSIS — R3912 Poor urinary stream: Secondary | ICD-10-CM | POA: Diagnosis not present

## 2016-05-06 DIAGNOSIS — R32 Unspecified urinary incontinence: Secondary | ICD-10-CM | POA: Insufficient documentation

## 2016-05-06 LAB — POCT URINALYSIS DIPSTICK
BILIRUBIN UA: NEGATIVE
Blood, UA: NEGATIVE
Glucose, UA: NEGATIVE
KETONES UA: NEGATIVE
Leukocytes, UA: NEGATIVE
Nitrite, UA: NEGATIVE
PH UA: 5.5
PROTEIN UA: NEGATIVE
Urobilinogen, UA: 1

## 2016-05-06 NOTE — Patient Instructions (Signed)
I have placed the referral to urology.  They will call you to schedule.  You do not have an infection.  You can discuss with each other about the desire to pursue work up (prostate).  If fever, pain or unable to urinate please be seen immediately.  Follow up with PCP next week.

## 2016-05-06 NOTE — Progress Notes (Signed)
Patient ID: Logan Valdez., male   DOB: 1946-08-06, 70 y.o.   MRN: [id number]    Logan, Valdez [birth year], 70 y.o., male MRN: [id number]  CC: urinary incontinence Subjective: Pt presents for an acute OV with complaints of "urinary incontinence" of 1 day duration.  Associated symptoms include nothing. Patient is present with his wife today. They state the patient was having urinary stream changes, that had been progressing slowly for quite sometime. He saw a provider concerning this matter, complaining of difficulty initiating his urinary stream and dribbling. He was started on finasteride 5 mg for presumed BPH/LUTS 2-3 weeks ago. Pt states he has been tolerating the medication, but he has noticed he has been going to the bathroom more. Yesterday x1, he had the urge to urinate and was almost to the toilet when he was unable to hold his urine any longer. He denies dysuria, changes in odor of urine, hematuria, or any pain. He has a h/o CVA in the past. Pt has declined PSA in the past. Last PSA 2007 (normal).   Allergies  Allergen Reactions  . Hydrochlorothiazide   . Indomethacin     syncope  . Pravastatin Sodium     REACTION: rash 11/09  . Lisinopril     REACTION: HA  . Micardis [Telmisartan]     REACTION: HA   Social History  Substance Use Topics  . Smoking status: Never Smoker   . Smokeless tobacco: Never Used  . Alcohol Use: No   Past Medical History  Diagnosis Date  . Obesity   . Dysmetabolic syndrome X   . Gout, unspecified   . Unspecified sleep apnea   . Mixed hyperlipidemia   . Type II or unspecified type diabetes mellitus without mention of complication, uncontrolled   . History of pulmonary embolism   . History of DVT (deep vein thrombosis)   . History of thrombophlebitis   . HTN (hypertension)   . History of stroke 2009    Sethi  . PFO (patent foramen ovale)   . Chronic lower back pain     Ramos  . DDD (degenerative disc disease), lumbar   .  Primary hypercoagulable state (HCC) 05/14/2008    Personal h/o DVT, PE, PFO, and lupus AC +    Past Surgical History  Procedure Laterality Date  . Wrist surgery      Right wrist repair post trauma  . Colonoscopy  2000    [facility]   Family History  Problem Relation Age of Onset  . Emphysema Father   . Diabetes Brother   . Hypertension Brother   . Heart attack Brother 43  . Skin cancer Brother   . Lung cancer Brother   . Cancer Maternal Aunt     ? Type   . Stroke Neg Hx      Medication List       This list is accurate as of: 05/06/16  9:55 AM.  Always use your most recent med list.               acetaminophen 500 MG tablet  Commonly known as:  TYLENOL  Take 500 mg by mouth 3 (three) times daily.     atorvastatin 20 MG tablet  Commonly known as:  LIPITOR  TAKE 1 TABLET BY MOUTH DAILY     docusate sodium 100 MG capsule  Commonly known as:  COLACE  Take 100 mg by mouth daily as needed for mild constipation.  finasteride 5 MG tablet  Commonly known as:  PROSCAR  Take 1 tablet (5 mg total) by mouth daily.     FOLTANX 3-35-2 MG Tabs  TAKE 1 TABLET BY MOUTH TWICE A DAY     gabapentin 300 MG capsule  Commonly known as:  NEURONTIN  TAKE ONE CAPSULE BY MOUTH THREE TIMES DAILY     HYDROcodone-acetaminophen 10-325 MG tablet  Commonly known as:  NORCO  Take 1 tablet by mouth every 6 (six) hours as needed.     memantine 10 MG tablet  Commonly known as:  NAMENDA  Take 1 tablet (10 mg total) by mouth 2 (two) times daily.     metoprolol tartrate 25 MG tablet  Commonly known as:  LOPRESSOR  TAKE 1 TABLET BY MOUTH TWICE A DAY     polyethylene glycol powder powder  Commonly known as:  GLYCOLAX/MIRALAX  Take 17 g by mouth daily as needed.     potassium gluconate 595 (99 K) MG Tabs tablet  Take 595 mg by mouth daily.     triamcinolone cream 0.1 %  Commonly known as:  KENALOG  Apply 1 application topically 2 (two) times daily. Apply to AA.     warfarin 5 MG  tablet  Commonly known as:  COUMADIN  TAKE AS DIRECTED BY COUMADIN CLINIC         ROS: Negative, with the exception of above mentioned in HPI   Objective:  BP 121/83 mmHg  Pulse 52  Temp(Src) 98.2 F (36.8 C) (Oral)  Resp 16  Wt 276 lb (125.193 kg)  SpO2 98% Body mass index is 43.22 kg/(m^2). Gen: Afebrile. No acute distress. Nontoxic in appearance, well developed, well  Nourished obese male.  HENT: AT. [place]. MMM, no oral lesions.  Eyes:Pupils Equal Round Reactive to light, Extraocular movements intact,  Conjunctiva without redness, discharge or icterus. CV: RRR , no edema Chest: CTAB, no wheeze or crackles.  Abd: Soft. obese. NTND. BS present. no Masses palpated. No rebound or guarding.  MSK: No CVA tenderness.  Neuro: PERLA. EOMi. Alert. Oriented x3    Results for orders placed or performed in visit on 05/06/16 (from the past 48 hour(s))  POCT urinalysis dipstick     Status: Normal   Collection Time: 05/06/16  9:55 AM  Result Value Ref Range   Color, UA dark yellow    Clarity, UA hazy    Glucose, UA negative    Bilirubin, UA negative    Ketones, UA negative    Spec Grav, UA >=1.030    Blood, UA negative    pH, UA 5.5    Protein, UA negative    Urobilinogen, UA 1.0    Nitrite, UA negative    Leukocytes, UA Negative Negative    Assessment/Plan: Logan Flankdward C Storlie Jr. is a 70 y.o. male present for acute OV for  Urinary incontinence, unspecified incontinence type - POCT urinalysis dipstick (negative) for infection/blood - Discussed treatment options with pt today, including PSA/DRE/prostate cancer/BPH/medications/referrals in detail. He is in no pain. He has no signs of infection/hematuria. It sounds like he experienced more urge incontinence x1 and was unable to make it to the toilet. This has not continued and he does not seem certain if he felt better off or on the medication. He does not seem certain he wants to pursue PSA, mostly because he is on coumadin and is  told he could never not take it, even if for a couple days.  - either way, discussed  with him acutely he does not have signs of infection and he is in no discomfort, he is urinating appropriate amounts. He can discuss with is family and either stop the medication, or attempt to stay on medication. He can have his PSA/DRE completed at anytime if he feels it something he would like to pursue. Discussed referral to urology for further bladder studies/PVR and consider other treatment options/medicaitons and they are agreeable to referral  - F/U with PCP within 1 week if symptoms worsen or return    > 25 minutes spent with patient, >50% of time spent face to face counseling patient and coordinating care.  electronically signed by:  Felix Pacinienee Mattheo Swindle, DO  [facility] [place]

## 2016-05-06 NOTE — Progress Notes (Signed)
Pre visit review using our clinic review tool, if applicable. No additional management support is needed unless otherwise documented below in the visit note. 

## 2016-05-08 ENCOUNTER — Telehealth: Payer: Self-pay

## 2016-05-08 NOTE — Telephone Encounter (Signed)
Logan Valdez wanted to ck on status of urology referral that Dr [facility]. I spoke with Myriam JacobsonHelen as pt coordinator and she will schedule urology appt and contact pt.

## 2016-05-11 ENCOUNTER — Other Ambulatory Visit: Payer: Self-pay | Admitting: Family Medicine

## 2016-05-22 ENCOUNTER — Other Ambulatory Visit (INDEPENDENT_AMBULATORY_CARE_PROVIDER_SITE_OTHER): Payer: PPO

## 2016-05-22 DIAGNOSIS — Z86718 Personal history of other venous thrombosis and embolism: Secondary | ICD-10-CM

## 2016-05-22 DIAGNOSIS — Z5181 Encounter for therapeutic drug level monitoring: Secondary | ICD-10-CM

## 2016-05-22 LAB — POCT INR: INR: 2.8

## 2016-05-26 DIAGNOSIS — H5203 Hypermetropia, bilateral: Secondary | ICD-10-CM | POA: Diagnosis not present

## 2016-05-31 ENCOUNTER — Other Ambulatory Visit: Payer: Self-pay | Admitting: Family Medicine

## 2016-05-31 ENCOUNTER — Other Ambulatory Visit (INDEPENDENT_AMBULATORY_CARE_PROVIDER_SITE_OTHER): Payer: PPO

## 2016-05-31 DIAGNOSIS — M109 Gout, unspecified: Secondary | ICD-10-CM

## 2016-05-31 DIAGNOSIS — I10 Essential (primary) hypertension: Secondary | ICD-10-CM

## 2016-05-31 DIAGNOSIS — E782 Mixed hyperlipidemia: Secondary | ICD-10-CM

## 2016-05-31 DIAGNOSIS — Z1159 Encounter for screening for other viral diseases: Secondary | ICD-10-CM

## 2016-05-31 DIAGNOSIS — E119 Type 2 diabetes mellitus without complications: Secondary | ICD-10-CM

## 2016-05-31 DIAGNOSIS — E538 Deficiency of other specified B group vitamins: Secondary | ICD-10-CM | POA: Diagnosis not present

## 2016-05-31 LAB — LIPID PANEL
CHOLESTEROL: 147 mg/dL (ref 0–200)
HDL: 49.8 mg/dL (ref >39.00)
LDL CALC: 75 mg/dL (ref 0–99)
NonHDL: 97.42
TRIGLYCERIDES: 112 mg/dL (ref 0.0–149.0)
Total CHOL/HDL Ratio: 3
VLDL: 22.4 mg/dL (ref 0.0–40.0)

## 2016-05-31 LAB — HEMOGLOBIN A1C: Hgb A1c MFr Bld: 6.3 % (ref 4.6–6.5)

## 2016-05-31 LAB — VITAMIN B12: Vitamin B-12: >1500 pg/mL — ABNORMAL HIGH (ref 211–911)

## 2016-05-31 LAB — BASIC METABOLIC PANEL
BUN: 12 mg/dL (ref 6–23)
CALCIUM: 9 mg/dL (ref 8.4–10.5)
CO2: 27 meq/L (ref 19–32)
Chloride: 105 mEq/L (ref 96–112)
Creatinine, Ser: 0.92 mg/dL (ref 0.40–1.50)
GFR: 86.26 mL/min (ref >60.00)
GLUCOSE: 103 mg/dL — AB (ref 70–99)
Potassium: 4.2 mEq/L (ref 3.5–5.1)
Sodium: 139 mEq/L (ref 135–145)

## 2016-05-31 LAB — URIC ACID: Uric Acid, Serum: 6.3 mg/dL (ref 4.0–7.8)

## 2016-06-01 LAB — HEPATITIS C ANTIBODY: HCV Ab: NEGATIVE

## 2016-06-08 ENCOUNTER — Ambulatory Visit (INDEPENDENT_AMBULATORY_CARE_PROVIDER_SITE_OTHER): Payer: PPO | Admitting: Family Medicine

## 2016-06-08 ENCOUNTER — Encounter: Payer: Self-pay | Admitting: Family Medicine

## 2016-06-08 VITALS — BP 126/70 | HR 56 | Temp 98.3°F | Ht 66.75 in | Wt 271.8 lb

## 2016-06-08 DIAGNOSIS — Z0000 Encounter for general adult medical examination without abnormal findings: Secondary | ICD-10-CM

## 2016-06-08 DIAGNOSIS — E782 Mixed hyperlipidemia: Secondary | ICD-10-CM | POA: Diagnosis not present

## 2016-06-08 DIAGNOSIS — D6859 Other primary thrombophilia: Secondary | ICD-10-CM

## 2016-06-08 DIAGNOSIS — F0150 Vascular dementia without behavioral disturbance: Secondary | ICD-10-CM

## 2016-06-08 DIAGNOSIS — M5416 Radiculopathy, lumbar region: Secondary | ICD-10-CM

## 2016-06-08 DIAGNOSIS — Z7189 Other specified counseling: Secondary | ICD-10-CM

## 2016-06-08 DIAGNOSIS — E538 Deficiency of other specified B group vitamins: Secondary | ICD-10-CM

## 2016-06-08 DIAGNOSIS — I63219 Cerebral infarction due to unspecified occlusion or stenosis of unspecified vertebral arteries: Secondary | ICD-10-CM

## 2016-06-08 DIAGNOSIS — Z23 Encounter for immunization: Secondary | ICD-10-CM | POA: Diagnosis not present

## 2016-06-08 DIAGNOSIS — E118 Type 2 diabetes mellitus with unspecified complications: Secondary | ICD-10-CM

## 2016-06-08 DIAGNOSIS — I10 Essential (primary) hypertension: Secondary | ICD-10-CM

## 2016-06-08 DIAGNOSIS — Q2111 Secundum atrial septal defect: Secondary | ICD-10-CM

## 2016-06-08 DIAGNOSIS — R195 Other fecal abnormalities: Secondary | ICD-10-CM

## 2016-06-08 DIAGNOSIS — R3912 Poor urinary stream: Secondary | ICD-10-CM

## 2016-06-08 DIAGNOSIS — H903 Sensorineural hearing loss, bilateral: Secondary | ICD-10-CM

## 2016-06-08 DIAGNOSIS — Q211 Atrial septal defect: Secondary | ICD-10-CM

## 2016-06-08 NOTE — Assessment & Plan Note (Addendum)
Continue foltanx. B12 remains high.

## 2016-06-08 NOTE — Progress Notes (Signed)
Pre visit review using our clinic review tool, if applicable. No additional management support is needed unless otherwise documented below in the visit note. 

## 2016-06-08 NOTE — Assessment & Plan Note (Signed)
Has f/u with urology later this month. Exam WNL today.

## 2016-06-08 NOTE — Assessment & Plan Note (Signed)
Diet controlled. Chronic, stable.

## 2016-06-08 NOTE — Assessment & Plan Note (Signed)

## 2016-06-08 NOTE — Assessment & Plan Note (Signed)
H/o this.  ?

## 2016-06-08 NOTE — Assessment & Plan Note (Signed)
Declined hearing aides.

## 2016-06-08 NOTE — Assessment & Plan Note (Signed)
On coumadin 

## 2016-06-08 NOTE — Assessment & Plan Note (Signed)
Continue namenda 10mg  bid.

## 2016-06-08 NOTE — Patient Instructions (Addendum)
Pneumovax today. Call your insurance about the shingles shot to see if it is covered or how much it would cost and where is cheaper (here or pharmacy).  If you want to receive here, call for nurse visit.  Ok to stop potassium. Bring me copy of advanced directive. Return in 6 months for follow up visit, sooner if needed.  Health Maintenance, Male A healthy lifestyle and preventative care can promote health and wellness.  Maintain regular health, dental, and eye exams.  Eat a healthy diet. Foods like vegetables, fruits, whole grains, low-fat dairy products, and lean protein foods contain the nutrients you need and are low in calories. Decrease your intake of foods high in solid fats, added sugars, and salt. Get information about a proper diet from your health care provider, if necessary.  Regular physical exercise is one of the most important things you can do for your health. Most adults should get at least 150 minutes of moderate-intensity exercise (any activity that increases your heart rate and causes you to sweat) each week. In addition, most adults need muscle-strengthening exercises on 2 or more days a week.   Maintain a healthy weight. The body mass index (BMI) is a screening tool to identify possible weight problems. It provides an estimate of body fat based on height and weight. Your health care provider can find your BMI and can help you achieve or maintain a healthy weight. For males 20 years and older:  A BMI below 18.5 is considered underweight.  A BMI of 18.5 to 24.9 is normal.  A BMI of 25 to 29.9 is considered overweight.  A BMI of 30 and above is considered obese.  Maintain normal blood lipids and cholesterol by exercising and minimizing your intake of saturated fat. Eat a balanced diet with plenty of fruits and vegetables. Blood tests for lipids and cholesterol should begin at age 22 and be repeated every 5 years. If your lipid or cholesterol levels are high, you are over  age 68, or you are at high risk for heart disease, you may need your cholesterol levels checked more frequently.Ongoing high lipid and cholesterol levels should be treated with medicines if diet and exercise are not working.  If you smoke, find out from your health care provider how to quit. If you do not use tobacco, do not start.  Lung cancer screening is recommended for adults aged 55-80 years who are at high risk for developing lung cancer because of a history of smoking. A yearly low-dose CT scan of the lungs is recommended for people who have at least a 30-pack-year history of smoking and are current smokers or have quit within the past 15 years. A pack year of smoking is smoking an average of 1 pack of cigarettes a day for 1 year (for example, a 30-pack-year history of smoking could mean smoking 1 pack a day for 30 years or 2 packs a day for 15 years). Yearly screening should continue until the smoker has stopped smoking for at least 15 years. Yearly screening should be stopped for people who develop a health problem that would prevent them from having lung cancer treatment.  If you choose to drink alcohol, do not have more than 2 drinks per day. One drink is considered to be 12 oz (360 mL) of beer, 5 oz (150 mL) of wine, or 1.5 oz (45 mL) of liquor.  Avoid the use of street drugs. Do not share needles with anyone. Ask for help if you  need support or instructions about stopping the use of drugs.  High blood pressure causes heart disease and increases the risk of stroke. High blood pressure is more likely to develop in:  People who have blood pressure in the end of the normal range (100-139/85-89 mm Hg).  People who are overweight or obese.  People who are African American.  If you are 54-57 years of age, have your blood pressure checked every 3-5 years. If you are 22 years of age or older, have your blood pressure checked every year. You should have your blood pressure measured twice--once  when you are at a hospital or clinic, and once when you are not at a hospital or clinic. Record the average of the two measurements. To check your blood pressure when you are not at a hospital or clinic, you can use:  An automated blood pressure machine at a pharmacy.  A home blood pressure monitor.  If you are 59-7 years old, ask your health care provider if you should take aspirin to prevent heart disease.  Diabetes screening involves taking a blood sample to check your fasting blood sugar level. This should be done once every 3 years after age 22 if you are at a normal weight and without risk factors for diabetes. Testing should be considered at a younger age or be carried out more frequently if you are overweight and have at least 1 risk factor for diabetes.  Colorectal cancer can be detected and often prevented. Most routine colorectal cancer screening begins at the age of 29 and continues through age 60. However, your health care provider may recommend screening at an earlier age if you have risk factors for colon cancer. On a yearly basis, your health care provider may provide home test kits to check for hidden blood in the stool. A small camera at the end of a tube may be used to directly examine the colon (sigmoidoscopy or colonoscopy) to detect the earliest forms of colorectal cancer. Talk to your health care provider about this at age 43 when routine screening begins. A direct exam of the colon should be repeated every 5-10 years through age 34, unless early forms of precancerous polyps or small growths are found.  People who are at an increased risk for hepatitis B should be screened for this virus. You are considered at high risk for hepatitis B if:  You were born in a country where hepatitis B occurs often. Talk with your health care provider about which countries are considered high risk.  Your parents were born in a high-risk country and you have not received a shot to protect  against hepatitis B (hepatitis B vaccine).  You have HIV or AIDS.  You use needles to inject street drugs.  You live with, or have sex with, someone who has hepatitis B.  You are a man who has sex with other men (MSM).  You get hemodialysis treatment.  You take certain medicines for conditions like cancer, organ transplantation, and autoimmune conditions.  Hepatitis C blood testing is recommended for all people born from [birth year] through 1966 and any individual with known risk factors for hepatitis C.  Healthy men should no longer receive prostate-specific antigen (PSA) blood tests as part of routine cancer screening. Talk to your health care provider about prostate cancer screening.  Testicular cancer screening is not recommended for adolescents or adult males who have no symptoms. Screening includes self-exam, a health care provider exam, and other screening tests. Consult with  your health care provider about any symptoms you have or any concerns you have about testicular cancer.  Practice safe sex. Use condoms and avoid high-risk sexual practices to reduce the spread of sexually transmitted infections (STIs).  You should be screened for STIs, including gonorrhea and chlamydia if:  You are sexually active and are younger than 24 years.  You are older than 24 years, and your health care provider tells you that you are at risk for this type of infection.  Your sexual activity has changed since you were last screened, and you are at an increased risk for chlamydia or gonorrhea. Ask your health care provider if you are at risk.  If you are at risk of being infected with HIV, it is recommended that you take a prescription medicine daily to prevent HIV infection. This is called pre-exposure prophylaxis (PrEP). You are considered at risk if:  You are a man who has sex with other men (MSM).  You are a heterosexual man who is sexually active with multiple partners.  You take drugs by  injection.  You are sexually active with a partner who has HIV.  Talk with your health care provider about whether you are at high risk of being infected with HIV. If you choose to begin PrEP, you should first be tested for HIV. You should then be tested every 3 months for as long as you are taking PrEP.  Use sunscreen. Apply sunscreen liberally and repeatedly throughout the day. You should seek shade when your shadow is shorter than you. Protect yourself by wearing long sleeves, pants, a wide-brimmed hat, and sunglasses year round whenever you are outdoors.  Tell your health care provider of new moles or changes in moles, especially if there is a change in shape or color. Also, tell your health care provider if a mole is larger than the size of a pencil eraser.  A one-time screening for abdominal aortic aneurysm (AAA) and surgical repair of large AAAs by ultrasound is recommended for men aged 56-75 years who are current or former smokers.  Stay current with your vaccines (immunizations).   This information is not intended to replace advice given to you by your health care provider. Make sure you discuss any questions you have with your health care provider.   Document Released: 04/20/2008 Document Revised: 11/13/2014 Document Reviewed: 03/20/2011 Elsevier Interactive Patient Education [employer].

## 2016-06-08 NOTE — Assessment & Plan Note (Signed)
Weight drop noted. Continue to monitor.

## 2016-06-08 NOTE — Assessment & Plan Note (Signed)
Chronic, stable. Continue current regimen. 

## 2016-06-08 NOTE — Assessment & Plan Note (Signed)
Advanced directive discussion - has at home, will bring us copy. HCPOA is wife Logan Valdez. 

## 2016-06-08 NOTE — Assessment & Plan Note (Signed)
Pain better controlled on scheduled tylenol 500mg  TID. Continue this with hydrocodone PRN breakthrough pain

## 2016-06-08 NOTE — Assessment & Plan Note (Signed)
Discussed options. Pt unsure he would want to proceed with colonoscopy if testing returns positive - so will defer screening at this time.

## 2016-06-08 NOTE — Assessment & Plan Note (Signed)
Chronic, stable. Continue atorvastatin 20mg daily. 

## 2016-06-08 NOTE — Progress Notes (Signed)
BP 126/70   Pulse (!) 56   Temp 98.3 F (36.8 C) (Oral)   Ht 5' 6.75" (1.695 m)   Wt 271 lb 12 oz (123.3 kg)   BMI 42.88 kg/m    CC: medicare wellness visit Subjective:    Patient ID: Logan Flank., male    DOB: 1946-03-30, 70 y.o.   MRN: [id number]  HPI: Logan Morandi. is a 70 y.o. male presenting on 06/08/2016 for Annual Exam   Pleasant 70 yo with h/o stroke 2009 with mild residual memory loss, hypercoagulable (lupus AC positive), chronic lower back pain with L lumbar radiculopathy from DDD (followed by Dr. Ethelene Hal) presents today for AMW. Patient is on coumadin for hypercoagulable state with lupus AC +, PFO, and history of DVT, PE, and CVA so is very high risk for stopping anticoagulation.  Increased confusion noted by wife. He does not drive.  Doing better with pain control on scheduled tylenol  tid.   Upcoming appt with urology later this month for urinary troubles - describes problems with stream when standing or sitting. This only happens intermittently. He has started using mug to urinate. Denies dysuria or urgency. He is on proscar  daily, pt feels this may have helped.   Hearing screen - previously failed - declined hearing aides.  Vision screen - at eye center Fall risk screen - passed. Depression screen - passed.  Preventative: COLONOSCOPY Date: [facility].  Colon cancer screening - ifob positive 12/2014. Decided to postpone at that time. Has not noticed blood in stool. Discussed again - has decided to again postpone.  Prostate cancer screening - discussed with pt and wife, decide to postpone.  Flu shot - yearly Tetanus shot - unsure prevnar 12/2014. Pneumovax today Shingles shot - will check with insurance.  Advanced directive discussion - has at home, will bring [place] copy. HCPOA is wife Logan Valdez.  Seat belt use discussed.  Sunscreen use discussed. no changing moles on skin.  Patient is married with 4 children. Patient is right handed.    Patient has 8 th grade education.   Relevant past medical, surgical, family and social history reviewed and updated as indicated. Interim medical history since our last visit reviewed. Allergies and medications reviewed and updated. Current Outpatient Prescriptions on File Prior to Visit  Medication Sig  . acetaminophen (TYLENOL) 500 MG tablet Take 500 mg by mouth 3 (three) times daily.  Marland Kitchen atorvastatin (LIPITOR) 20 MG tablet Take 1 tablet (20 mg total) by mouth daily.  Marland Kitchen docusate sodium (COLACE) 100 MG capsule Take 100 mg by mouth daily as needed for mild constipation.  . finasteride (PROSCAR) 5 MG tablet Take 1 tablet (5 mg total) by mouth daily.  Marland Kitchen gabapentin (NEURONTIN) 300 MG capsule TAKE ONE CAPSULE BY MOUTH THREE TIMES DAILY  . HYDROcodone-acetaminophen (NORCO) 10-325 MG per tablet Take 1 tablet by mouth every 6 (six) hours as needed.    Marland Kitchen L-Methylfolate-B6-B12 (FOLTANX) 3-35-2 MG TABS TAKE 1 TABLET BY MOUTH TWICE A DAY  . memantine (NAMENDA) 10 MG tablet Take 1 tablet (10 mg total) by mouth 2 (two) times daily.  . metoprolol tartrate (LOPRESSOR) 25 MG tablet TAKE 1 TABLET BY MOUTH TWICE A DAY  . polyethylene glycol powder (GLYCOLAX/MIRALAX) powder Take 17 g by mouth daily as needed.  . triamcinolone cream (KENALOG) 0.1 % Apply 1 application topically 2 (two) times daily. Apply to AA.  . warfarin (COUMADIN) 5 MG tablet TAKE AS DIRECTED BY COUMADIN CLINIC  . [  DISCONTINUED] metoprolol succinate (TOPROL-XL) 100 MG 24 hr tablet 1/2 by mouth daily   No current facility-administered medications on file prior to visit.     Review of Systems Per HPI unless specifically indicated in ROS section     Objective:    BP 126/70   Pulse (!) 56   Temp 98.3 F (36.8 C) (Oral)   Ht 5' 6.75" (1.695 m)   Wt 271 lb 12 oz (123.3 kg)   BMI 42.88 kg/m   Wt Readings from Last 3 Encounters:  06/08/16 271 lb 12 oz (123.3 kg)  05/06/16 276 lb (125.2 kg)  04/17/16 282 lb 8 oz (128.1 kg)     Physical Exam  Constitutional: He is oriented to person, place, and time. He appears well-developed and well-nourished. No distress.  HENT:  Head: Normocephalic and atraumatic.  Right Ear: Hearing, tympanic membrane, external ear and ear canal normal.  Left Ear: Hearing, tympanic membrane, external ear and ear canal normal.  Nose: Nose normal.  Mouth/Throat: Uvula is midline, oropharynx is clear and moist and mucous membranes are normal. No oropharyngeal exudate, posterior oropharyngeal edema or posterior oropharyngeal erythema.  Eyes: Conjunctivae and EOM are normal. Pupils are equal, round, and reactive to light. No scleral icterus.  Neck: Normal range of motion. Neck supple. No thyromegaly present.  Cardiovascular: Normal rate, regular rhythm and intact distal pulses.   Murmur (mild vibratory systolic murmur) heard. Pulses:      Radial pulses are 2+ on the right side, and 2+ on the left side.  Pulmonary/Chest: Effort normal and breath sounds normal. No respiratory distress. He has no wheezes. He has no rales.  Abdominal: Hernia confirmed negative in the right inguinal area and confirmed negative in the left inguinal area.  Genitourinary: Penis normal. Right testis shows no mass, no swelling and no tenderness. Right testis is descended. Left testis shows mass. Left testis shows no swelling and no tenderness. Left testis is descended. Uncircumcised. No hypospadias.  Genitourinary Comments: Hydrocele on left  Musculoskeletal: Normal range of motion. He exhibits no edema.  Lymphadenopathy:    He has no cervical adenopathy.  Neurological: He is alert and oriented to person, place, and time.  CN grossly intact, station and gait intact  Skin: Skin is warm and dry. No rash noted.  Psychiatric: He has a normal mood and affect. His behavior is normal. Judgment and thought content normal.  Nursing note and vitals reviewed.  Results for orders placed or performed in visit on 05/31/16  Vitamin  B12  Result Value Ref Range   Vitamin B-12 >1500 (H) 211 - 911 pg/mL  Hepatitis C antibody  Result Value Ref Range   HCV Ab NEGATIVE NEGATIVE  Uric acid  Result Value Ref Range   Uric Acid, Serum 6.3 4.0 - 7.8 mg/dL  Lipid panel  Result Value Ref Range   Cholesterol 147 0 - 200 mg/dL   Triglycerides 409.8 0.0 - 149.0 mg/dL   HDL 11.91 >47.82 mg/dL   VLDL 95.6 0.0 - 21.3 mg/dL   LDL Cholesterol 75 0 - 99 mg/dL   Total CHOL/HDL Ratio 3    NonHDL 97.42   Basic metabolic panel  Result Value Ref Range   Sodium 139 135 - 145 mEq/L   Potassium 4.2 3.5 - 5.1 mEq/L   Chloride 105 96 - 112 mEq/L   CO2 27 19 - 32 mEq/L   Glucose, Bld 103 (H) 70 - 99 mg/dL   BUN 12 6 - 23 mg/dL  Creatinine, Ser 0.92 0.40 - 1.50 mg/dL   Calcium 9.0 8.4 - 78.6 mg/dL   GFR 76.72 >09.47 mL/min  Hemoglobin A1c  Result Value Ref Range   Hgb A1c MFr Bld 6.3 4.6 - 6.5 %      Assessment & Plan:   Problem List Items Addressed This Visit    Advanced care planning/counseling discussion    Advanced directive discussion - has at home, will bring [place] copy. HCPOA is wife Logan Valdez.       B12 deficiency    Continue foltanx. B12 remains high.       Diabetes mellitus type 2, controlled, with complications (HCC)    Diet controlled. Chronic, stable.      Heme positive stool    Discussed options. Pt unsure he would want to proceed with colonoscopy if testing returns positive - so will defer screening at this time.       HTN (hypertension)    Chronic, stable. Continue current regimen.      HYPERLIPIDEMIA    Chronic, stable. Continue atorvastatin 20mg  daily.      Lumbar radiculopathy    Pain better controlled on scheduled tylenol 500mg  TID. Continue this with hydrocodone PRN breakthrough pain      Medicare annual wellness visit, subsequent - Primary    I have personally reviewed the Medicare Annual Wellness questionnaire and have noted 1. The patient's medical and social history 2. Their use of alcohol,  tobacco or illicit drugs 3. Their current medications and supplements 4. The patient's functional ability including ADL's, fall risks, home safety risks and hearing or visual impairment. Cognitive function has been assessed and addressed as indicated.  5. Diet and physical activity 6. Evidence for depression or mood disorders The patients weight, height, BMI have been recorded in the chart. I have made referrals, counseling and provided education to the patient based on review of the above and I have provided the pt with a written personalized care plan for preventive services. Provider list updated.. See scanned questionairre as needed for further documentation. Reviewed preventative protocols and updated unless pt declined.       Obesity, Class III, BMI 40-49.9 (morbid obesity) (HCC)    Weight drop noted. Continue to monitor.       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       PATENT FORAMEN OVALE   Poor urinary stream    Has f/u with urology later this month. Exam WNL today.       Primary hypercoagulable state (HCC)    On coumadin.      Sensorineural hearing loss, bilateral    Declined hearing aides.      Vascular dementia    Continue namenda 10mg  bid.        Other Visit Diagnoses    Need for 23-polyvalent pneumococcal polysaccharide vaccine       Relevant Orders   Pneumococcal polysaccharide vaccine 23-valent greater than or equal to 2yo subcutaneous/IM (Completed)       Follow up plan: Return in about 6 months (around 12/09/2016), or as needed, for follow up visit.  Eustaquio Boyden, MD

## 2016-06-10 ENCOUNTER — Other Ambulatory Visit: Payer: Self-pay | Admitting: Family Medicine

## 2016-06-11 NOTE — Telephone Encounter (Signed)
Routed to PCP for input.  I sent it in the meantime.

## 2016-06-12 MED ORDER — FINASTERIDE 5 MG PO TABS: 5.0000 mg | ORAL_TABLET | Freq: Every day | ORAL | 3 refills | Status: DC

## 2016-06-12 NOTE — Telephone Encounter (Signed)
Will refill for now, has f/u with uro later this month.

## 2016-06-22 DIAGNOSIS — N3942 Incontinence without sensory awareness: Secondary | ICD-10-CM | POA: Diagnosis not present

## 2016-06-22 DIAGNOSIS — R350 Frequency of micturition: Secondary | ICD-10-CM | POA: Diagnosis not present

## 2016-06-22 DIAGNOSIS — R351 Nocturia: Secondary | ICD-10-CM | POA: Diagnosis not present

## 2016-06-28 ENCOUNTER — Other Ambulatory Visit: Payer: Self-pay | Admitting: Family Medicine

## 2016-07-03 ENCOUNTER — Other Ambulatory Visit: Payer: PPO

## 2016-07-03 ENCOUNTER — Other Ambulatory Visit (INDEPENDENT_AMBULATORY_CARE_PROVIDER_SITE_OTHER): Payer: PPO

## 2016-07-03 DIAGNOSIS — Z5181 Encounter for therapeutic drug level monitoring: Secondary | ICD-10-CM

## 2016-07-03 DIAGNOSIS — Z86718 Personal history of other venous thrombosis and embolism: Secondary | ICD-10-CM

## 2016-07-03 LAB — POCT INR: INR: 2.3

## 2016-07-14 ENCOUNTER — Ambulatory Visit (INDEPENDENT_AMBULATORY_CARE_PROVIDER_SITE_OTHER): Payer: PPO | Admitting: Nurse Practitioner

## 2016-07-14 ENCOUNTER — Encounter: Payer: Self-pay | Admitting: Nurse Practitioner

## 2016-07-14 VITALS — BP 144/72 | HR 48 | Ht 66.75 in | Wt 269.6 lb

## 2016-07-14 DIAGNOSIS — I10 Essential (primary) hypertension: Secondary | ICD-10-CM

## 2016-07-14 DIAGNOSIS — I63219 Cerebral infarction due to unspecified occlusion or stenosis of unspecified vertebral arteries: Secondary | ICD-10-CM | POA: Diagnosis not present

## 2016-07-14 DIAGNOSIS — Z8673 Personal history of transient ischemic attack (TIA), and cerebral infarction without residual deficits: Secondary | ICD-10-CM

## 2016-07-14 DIAGNOSIS — F0150 Vascular dementia without behavioral disturbance: Secondary | ICD-10-CM | POA: Diagnosis not present

## 2016-07-14 MED ORDER — MEMANTINE HCL 10 MG PO TABS: 10.0000 mg | ORAL_TABLET | Freq: Two times a day (BID) | ORAL | 7 refills | Status: DC

## 2016-07-14 NOTE — Patient Instructions (Signed)
Continue Namenda 10 mg twice daily will refill Continue Coumadin for secondary stroke prevention, history of pulmonary embolus and hypercoagulable state Hemoglobin A1c goal below 6.5,  lipids with LDL cholesterol goal below 70 mg percent continue Lipitor hypertension with blood pressure goal below 130/90. Continue B/P medications Continue healthy diet and weight loss for overall health and well-being Follow-up in 6 months, next with Dr. Pearlean BrownieSethi

## 2016-07-14 NOTE — Progress Notes (Addendum)
[facility]  PATIENT: Logan Valdez. 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 He has risk factors of diabetes and hypertension obesity and hyperlipidemia. At the time of his stroke he was found to have a PFO. He elected not to have it closed.  Update 06/10/2014:Logan. 10, 70 year old male returns for followup. Last seen in this office 2/5/ 2015 Carotid Doppler revealed 50-69% stenosis involving the left proximal ICA in September 2014. He states he is doing well and still has mild short-term memory difficulties which appear to be unchanged. Patient is currently taking Namenda XR 28 mg and he is tolerating without difficulty. His memory is stable .He has no new neurologic complaints, he returns for reevaluation. Due to chronic back pain he gets no exercise. He has not had further stroke TIA symptoms. He remains on Coumadin with minimal bruising.   Update 07/15/2015 PS: He returns for follow-up today complaint by his wife. The patient was unable to afford Namenda XR. 28 mg and hence switched back to Namenda 10 mg twice daily. He is tolerating it well but wife feels subjectively maybe a little bit worse. He continues to have memory and cognitive difficulties but under wife's supervision he does fine. He is mainly bothered by severe low back pain but has not been considered a surgical candidate. He has limited mobility and does not exercise a lot but has lost some weight approximately 13 pounds. He goes to pain management He remains on warfarin which is tolerating well with only minor bruising and no significant bleeding episodes. He had carotid ultrasound done on 07/15/14 show 50-69% left ICA stenosis. He has had no recurrent stroke or TIA symptoms. He is  tolerating Lipitor well without any side effects. Last lipid profile was in February . Blood pressure is mildly elevated in the office today 142/78 UPDATE 3/8/17[name]. 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 He has risk factors of diabetes and hypertension obesity and hyperlipidemia. He also has a vascular dementia and is currently on Namenda twice daily. His memory is stable. He continues to go to pain management. He is on warfarin. Last carotid ultrasound in September 2015 shows 50-69% left ICA stenosis. He needs repeat. He has not had recurrent stroke or TIA symptoms. Blood  pressure in good control 120/84 in the  office. Lipids are followed by his primary care and he remains on Lipitor. No falls since last seen. He returns for reevaluation UPDATE 9/8/17[name] Valdez, 70 year old male returns for follow-up with his history of stroke event in October 2009 resulting in vascular dementia and is currently on Namenda twice daily. His memory is stable. He has risk factors of diabetes and hypertension obesity and hyperlipidemia. He also has chronic back pain and goes to pain management. Repeat carotid Doppler after last visit in March was unchanged from previous Doppler in September 2015. He has not had recurrent stroke or TIA symptoms. He is presently on Coumadin. Minimal bruising noted on his hands and forearms. Blood pressure mildly elevated in the office today at 144/72. He remains on Lipitor for hyperlipidemia. He denies any myalgias or muscle pain. His labs are followed by primary care. He has not fallen. He returns for reevaluation REVIEW OF SYSTEMS: Full  14 system review of systems performed and notable only for those listed, all others are neg:  Constitutional: neg  Cardiovascular: neg Ear/Nose/Throat: Hearing loss  Skin: neg Eyes: neg Respiratory: neg Gastroitestinal: neg  Hematology/Lymphatic: neg  Endocrine:  neg Musculoskeletal: Chronic back pain, seen by pain management Allergy/Immunology: neg Neurological: neg Psychiatric: neg Sleep : neg   ALLERGIES: Allergies  Allergen Reactions  . Hydrochlorothiazide   . Indomethacin     syncope  . Pravastatin Sodium     REACTION: rash 11/09  . Lisinopril     REACTION: HA  . Micardis [Telmisartan]     REACTION: HA    HOME MEDICATIONS: Outpatient Medications Prior to Visit  Medication Sig Dispense Refill  . acetaminophen (TYLENOL) 500 MG tablet Take 500 mg by mouth 3 (three) times daily.    Marland Kitchen. atorvastatin (LIPITOR) 20 MG tablet TAKE 1 TABLET BY MOUTH DAILY 30 tablet 11  . docusate sodium (COLACE) 100 MG capsule Take 100 mg by mouth daily as needed for mild constipation.    . finasteride (PROSCAR) 5 MG tablet Take 1 tablet (5 mg total) by mouth daily. 30 tablet 3  . gabapentin (NEURONTIN) 300 MG capsule TAKE ONE CAPSULE BY MOUTH THREE TIMES DAILY 90 capsule 11  . HYDROcodone-acetaminophen (NORCO) 10-325 MG per tablet Take 1 tablet by mouth every 6 (six) hours as needed.      Marland Kitchen. L-Methylfolate-B6-B12 (FOLTANX) 3-35-2 MG TABS TAKE 1 TABLET BY MOUTH TWICE A DAY 180 tablet 1  . memantine (NAMENDA) 10 MG tablet Take 1 tablet (10 mg total) by mouth 2 (two) times daily. 60 tablet 7  . metoprolol tartrate (LOPRESSOR) 25 MG tablet TAKE 1 TABLET BY MOUTH TWICE A DAY 180 tablet 3  . polyethylene glycol powder (GLYCOLAX/MIRALAX) powder DISSOLVE ONE (1) CAPFUL (17GM) INTO 4 TO8 OUNCES OF FLUID AND TAKE BYMOUTH ONCE DAILY AS DIRECTED 255 g 3  . triamcinolone cream (KENALOG) 0.1 % Apply 1 application topically 2 (two) times daily. Apply to AA. 30 g 0  . warfarin (COUMADIN) 5 MG tablet TAKE AS DIRECTED BY COUMADIN CLINIC 90 tablet 0   No facility-administered medications prior to visit.     PAST MEDICAL HISTORY: Past Medical History:  Diagnosis Date  . Chronic lower back pain    Ramos  . DDD (degenerative disc disease), lumbar   . Dysmetabolic syndrome X    . Gout, unspecified   . History of DVT (deep vein thrombosis)   . History of pulmonary embolism   . History of stroke 2009   [place]  . History of thrombophlebitis   . HTN (hypertension)   . Mixed hyperlipidemia   . Obesity   . PFO (patent foramen ovale)   . Primary hypercoagulable state (HCC) 05/14/2008   Personal h/o DVT, PE, PFO, and lupus AC +   . Type II or unspecified type diabetes mellitus without mention of complication, uncontrolled   . Unspecified sleep apnea     PAST SURGICAL HISTORY: Past Surgical History:  Procedure Laterality Date  . COLONOSCOPY  2000   [facility]  . WRIST SURGERY     Right wrist repair post trauma    FAMILY HISTORY: Family History  Problem Relation Age of Onset  . Emphysema Father   . Diabetes Brother   . Hypertension Brother   . Heart attack Brother 43  . Skin cancer Brother   . Lung cancer Brother   . Cancer Maternal Aunt     ? Type   . Stroke  Neg Hx     SOCIAL HISTORY: Social History   Social History  . Marital status: Married    Spouse name: N/A  . Number of children: 4  . Years of education: 8 th   Occupational History  . Not on file.   Social History Main Topics  . Smoking status: Never Smoker  . Smokeless tobacco: Never Used  . Alcohol use No  . Drug use: No  . Sexual activity: Not on file   Other Topics Concern  . Not on file   Social History Narrative   Patient is married with 4 children.   Patient is right handed.   Patient has 8 th grade education.   Patient drinks 4-5 cups daily.     PHYSICAL EXAM  Vitals:   07/14/16 1109  BP: (!) 144/72  Pulse: (!) 48  Weight: 269 lb 9.6 oz (122.3 kg)  Height: 5' 6.75" (1.695 m)   Body mass index is 42.54 kg/m. Generalized: Well developed, obese male in no acute distress  Head: normocephalic and atraumatic,. Oropharynx benign  Neck: Supple, no carotid bruits  Cardiac: Regular rate rhythm, no murmur  Musculoskeletal: No deformity  Neurological examination    Mentation: Alert oriented to time, place, history taking. MMSE 16/30. Last MMSE 18/30 missing items in orientation, calculation , unable to write a sentence or copy a figure. AFT 6. Follows all commands speech and language fluent  Cranial nerve II-XII: Pupils were equal round reactive to light extraocular movements were full, visual field show partial right homonymous hemianopsia on confrontational test. Facial sensation and strength were normal. Hard of hearing. Uvula tongue midline. head turning and shoulder shrug were normal and symmetric.Tongue protrusion into cheek strength was normal.  Motor: normal bulk and tone, full strength in the BUE, BLE, fine finger movements normal, no pronator drift. No focal weakness  Sensory: normal and symmetric to light touch, pinprick, and vibration in the upper and lower extremities Coordination: finger-nose-finger, heel-to-shin bilaterally, no dysmetria  Reflexes: 1+ upper lower and symmetric  Gait and Station: Rising up from seated position without assistance, normal stance, moderate stride, good arm swing, smooth turning, able to perform tiptoe, and heel walking without difficulty. Tandem gait is steady no assistive device   DIAGNOSTIC DATA (LABS, IMAGING, TESTING) - I reviewed patient records, labs, notes, testing and imaging myself where available.  Lab Results  Component Value Date   WBC 8.4 01/09/2016   HGB 13.9 01/09/2016   HCT 42.1 01/09/2016   MCV 89.9 01/09/2016   PLT 158 01/09/2016      Component Value Date/Time   NA 139 05/31/2016 1110   K 4.2 05/31/2016 1110   CL 105 05/31/2016 1110   CO2 27 05/31/2016 1110   GLUCOSE 103 (H) 05/31/2016 1110   BUN 12 05/31/2016 1110   CREATININE 0.92 05/31/2016 1110   CALCIUM 9.0 05/31/2016 1110   PROT 6.4 (L) 08/31/2015 1150   ALBUMIN 3.6 08/31/2015 1150   AST 28 08/31/2015 1150   ALT 22 08/31/2015 1150   ALKPHOS 59 08/31/2015 1150   BILITOT 1.1 08/31/2015 1150   GFRNONAA >60 01/09/2016 1632    GFRAA >60 01/09/2016 1632        ASSESSMENT AND PLAN 70 y.o. year old male has a past medical history of Obesity; Dysmetabolic syndrome X; Unspecified sleep apnea; Mixed hyperlipidemia; Type II or unspecified type diabetes mellitus without mention of complication, uncontrolled; History of pulmonary embolism; History of DVT (deep vein thrombosis); History of thrombophlebitis; HTN (hypertension);  History of stroke (2009); PFO (patent foramen ovale); Chronic lower back pain; DDD (degenerative disc disease), lumbar; and Primary hypercoagulable state (05/14/2008). here to follow-up.The patient is a current patient of Dr. [name] who is out of the office today . This note is sent to the work in doctor.    PLAN:Continue Namenda 10 mg twice daily will refill Continue Coumadin for secondary stroke prevention, history of pulmonary embolus and hypercoagulable state Hemoglobin A1c goal below 6.5,  lipids with LDL cholesterol goal below 70 mg percent continue Lipitor hypertension with blood pressure goal below 130/90. Continue B/P medications Continue healthy diet and weight loss for overall health and well-being Try to exercise Follow-up in 6 months, next with Dr. Caswell Corwin Darrol Angel, [facility], [facility], APRN  [facility] [address], [address] [place], [place] [address] [phone]  Personally have participated in and made any corrections needed to history, physical, neuro exam,assessment and plan as stated above.  I have personally discussed with NP, evaluated lab date, reviewed imaging studies and agree with radiology interpretations.    Naomie Dean, MD [facility]

## 2016-07-31 ENCOUNTER — Other Ambulatory Visit (INDEPENDENT_AMBULATORY_CARE_PROVIDER_SITE_OTHER): Payer: PPO

## 2016-07-31 ENCOUNTER — Other Ambulatory Visit: Payer: Self-pay | Admitting: Family Medicine

## 2016-07-31 DIAGNOSIS — Z5181 Encounter for therapeutic drug level monitoring: Secondary | ICD-10-CM

## 2016-07-31 DIAGNOSIS — Z86718 Personal history of other venous thrombosis and embolism: Secondary | ICD-10-CM

## 2016-07-31 LAB — POCT INR: INR: 2.7

## 2016-08-28 ENCOUNTER — Other Ambulatory Visit (INDEPENDENT_AMBULATORY_CARE_PROVIDER_SITE_OTHER): Payer: PPO

## 2016-08-28 DIAGNOSIS — Z86718 Personal history of other venous thrombosis and embolism: Secondary | ICD-10-CM | POA: Diagnosis not present

## 2016-08-28 DIAGNOSIS — Z5181 Encounter for therapeutic drug level monitoring: Secondary | ICD-10-CM

## 2016-08-28 LAB — POCT INR: INR: 3

## 2016-08-31 DIAGNOSIS — G894 Chronic pain syndrome: Secondary | ICD-10-CM | POA: Diagnosis not present

## 2016-08-31 DIAGNOSIS — M5442 Lumbago with sciatica, left side: Secondary | ICD-10-CM | POA: Diagnosis not present

## 2016-08-31 DIAGNOSIS — Z79891 Long term (current) use of opiate analgesic: Secondary | ICD-10-CM | POA: Diagnosis not present

## 2016-08-31 DIAGNOSIS — M5136 Other intervertebral disc degeneration, lumbar region: Secondary | ICD-10-CM | POA: Diagnosis not present

## 2016-09-25 ENCOUNTER — Other Ambulatory Visit (INDEPENDENT_AMBULATORY_CARE_PROVIDER_SITE_OTHER): Payer: PPO

## 2016-09-25 ENCOUNTER — Ambulatory Visit (INDEPENDENT_AMBULATORY_CARE_PROVIDER_SITE_OTHER): Payer: PPO

## 2016-09-25 DIAGNOSIS — Z5181 Encounter for therapeutic drug level monitoring: Secondary | ICD-10-CM

## 2016-09-25 DIAGNOSIS — Z86718 Personal history of other venous thrombosis and embolism: Secondary | ICD-10-CM

## 2016-09-25 LAB — POCT INR: INR: 3

## 2016-09-25 NOTE — Patient Instructions (Signed)
Pre visit review using our clinic review tool, if applicable. No additional management support is needed unless otherwise documented below in the visit note. 

## 2016-09-27 DIAGNOSIS — M5136 Other intervertebral disc degeneration, lumbar region: Secondary | ICD-10-CM | POA: Diagnosis not present

## 2016-10-03 ENCOUNTER — Telehealth: Payer: Self-pay | Admitting: Family Medicine

## 2016-10-03 NOTE — Telephone Encounter (Signed)
Will see tomorrow.  May take tylenol and OTC delsym or dimetapp for cough.

## 2016-10-03 NOTE — Telephone Encounter (Signed)
Patient has appointment tomorrow 11/29 at 10:15am to see Dr. Reece AgarG. This writer spoke wife to confirm appointment and to give tylenol/delsym recommendations per Md.  Wife, [name], verbalizes understanding.

## 2016-10-03 NOTE — Telephone Encounter (Signed)
Patient Name: Logan Valdez [facility] DOB: [birth year] Initial Comment Caller states her husband is on several medications. He's coughing and wants to know what he can take. Nurse Assessment Nurse: Logan Elizabethrumbull, RN, Logan Valdez Date/Time (Eastern Time): 10/03/2016 12:36:20 PM Confirm and document reason for call. If symptomatic, describe symptoms. You must click the next button to save text entered. ---Logan Valdez states that her husband developed a non-productive cough and cold symptoms yesterday. No severe breathing difficulty. No fever. Does the patient have any new or worsening symptoms? ---Yes Will a triage be completed? ---Yes Related visit to physician within the last 2 weeks? ---Yes Does the PT have any chronic conditions? (i.e. diabetes, asthma, etc.) ---Yes List chronic conditions. ---Stroke, Takes blood thinners, High Blood Pressure, Back problems Is this a behavioral health or substance abuse call? ---No Guidelines Guideline Title Affirmed Question Affirmed Notes Cough - Acute Non-Productive SEVERE coughing spells (e.g., whooping sound after coughing, vomiting after coughing) Final Disposition User See Physician within 24 Hours Trumbull, RN, Logan AngCathy Comments Logan Valdez states they scheduled an appointment for 10:15am tomorrow. Referrals REFERRED TO PCP OFFICE Disagree/Comply: Comply

## 2016-10-04 ENCOUNTER — Other Ambulatory Visit: Payer: Self-pay | Admitting: Family Medicine

## 2016-10-04 ENCOUNTER — Encounter: Payer: Self-pay | Admitting: Family Medicine

## 2016-10-04 ENCOUNTER — Ambulatory Visit (INDEPENDENT_AMBULATORY_CARE_PROVIDER_SITE_OTHER): Payer: PPO | Admitting: Family Medicine

## 2016-10-04 VITALS — BP 124/64 | HR 56 | Temp 98.4°F | Wt 269.0 lb

## 2016-10-04 DIAGNOSIS — J069 Acute upper respiratory infection, unspecified: Secondary | ICD-10-CM | POA: Diagnosis not present

## 2016-10-04 DIAGNOSIS — B9789 Other viral agents as the cause of diseases classified elsewhere: Secondary | ICD-10-CM | POA: Diagnosis not present

## 2016-10-04 NOTE — Progress Notes (Signed)
BP 124/64   Pulse (!) 56   Temp 98.4 F (36.9 C) (Oral)   Wt 269 lb (122 kg)   SpO2 97%   BMI 42.45 kg/m    CC: URI Subjective:    Patient ID: Logan FlankEdward C Bracher Jr., male    DOB: September 19, 1946, 70 y.o.   MRN: [id number]  HPI: Logan Flankdward C Linz Jr. is a 70 y.o. male presenting on 10/04/2016 for Cough and URI   4-5d h/o congestion, rhinorrhea, trouble sleeping due to rhinorrhea. Ongoing dry cough. Rattling in chest. Mild PNdrainage.   Denies fevers/chills, body aches, ST.   + sick contact at home.  mucinex has helped.  No recent asthma.  Not around smokers  Received ESI into lumbar spine 1 wk ago.   Relevant past medical, surgical, family and social history reviewed and updated as indicated. Interim medical history since our last visit reviewed. Allergies and medications reviewed and updated. Current Outpatient Prescriptions on File Prior to Visit  Medication Sig  . acetaminophen (TYLENOL) 500 MG tablet Take 500 mg by mouth 3 (three) times daily.  Marland Kitchen. atorvastatin (LIPITOR) 20 MG tablet TAKE 1 TABLET BY MOUTH DAILY  . docusate sodium (COLACE) 100 MG capsule Take 100 mg by mouth daily as needed for mild constipation.  . finasteride (PROSCAR) 5 MG tablet Take 1 tablet (5 mg total) by mouth daily.  Marland Kitchen. HYDROcodone-acetaminophen (NORCO) 10-325 MG per tablet Take 1 tablet by mouth every 6 (six) hours as needed.    Marland Kitchen. L-Methylfolate-B6-B12 (FOLTANX) 3-35-2 MG TABS TAKE 1 TABLET BY MOUTH TWICE A DAY  . memantine (NAMENDA) 10 MG tablet Take 1 tablet (10 mg total) by mouth 2 (two) times daily.  . metoprolol tartrate (LOPRESSOR) 25 MG tablet TAKE 1 TABLET BY MOUTH TWICE A DAY  . polyethylene glycol powder (GLYCOLAX/MIRALAX) powder DISSOLVE ONE (1) CAPFUL (17GM) INTO 4 TO8 OUNCES OF FLUID AND TAKE BYMOUTH ONCE DAILY AS DIRECTED  . triamcinolone cream (KENALOG) 0.1 % Apply 1 application topically 2 (two) times daily. Apply to AA.  . warfarin (COUMADIN) 5 MG tablet TAKE AS DIRECTED BY  COUMADIN CLINIC  . [DISCONTINUED] metoprolol succinate (TOPROL-XL) 100 MG 24 hr tablet 1/2 by mouth daily   No current facility-administered medications on file prior to visit.     Review of Systems Per HPI unless specifically indicated in ROS section     Objective:    BP 124/64   Pulse (!) 56   Temp 98.4 F (36.9 C) (Oral)   Wt 269 lb (122 kg)   SpO2 97%   BMI 42.45 kg/m   Wt Readings from Last 3 Encounters:  10/04/16 269 lb (122 kg)  07/14/16 269 lb 9.6 oz (122.3 kg)  06/08/16 271 lb 12 oz (123.3 kg)    Physical Exam  Constitutional: He appears well-developed and well-nourished. No distress.  HENT:  Head: Normocephalic and atraumatic.  Right Ear: Hearing, tympanic membrane, external ear and ear canal normal.  Left Ear: Hearing, tympanic membrane, external ear and ear canal normal.  Nose: Nose normal. No mucosal edema or rhinorrhea. Right sinus exhibits no maxillary sinus tenderness and no frontal sinus tenderness. Left sinus exhibits no maxillary sinus tenderness and no frontal sinus tenderness.  Mouth/Throat: Uvula is midline, oropharynx is clear and moist and mucous membranes are normal. No oropharyngeal exudate, posterior oropharyngeal edema, posterior oropharyngeal erythema or tonsillar abscesses.  Eyes: Conjunctivae and EOM are normal. Pupils are equal, round, and reactive to light. No scleral icterus.  Neck: Normal range  of motion. Neck supple.  Cardiovascular: Normal rate, regular rhythm, normal heart sounds and intact distal pulses.   No murmur heard. Pulmonary/Chest: Effort normal and breath sounds normal. No respiratory distress. He has no wheezes. He has no rales.  Lymphadenopathy:    He has no cervical adenopathy.  Skin: Skin is warm and dry. No rash noted.  Nursing note and vitals reviewed.  Results for orders placed or performed in visit on 09/25/16  POCT INR  Result Value Ref Range   INR 3.0       Assessment & Plan:   Problem List Items Addressed  This Visit    Viral URI with cough - Primary    Benign exam. Supportive care discussed.  Rd flags to seek further care reviewed.           Follow up plan: Return if symptoms worsen or fail to improve.  Eustaquio BoydenJavier Zayn Selley, MD

## 2016-10-04 NOTE — Assessment & Plan Note (Signed)
Benign exam. Supportive care discussed.  Rd flags to seek further care reviewed.

## 2016-10-04 NOTE — Progress Notes (Signed)
Pre visit review using our clinic review tool, if applicable. No additional management support is needed unless otherwise documented below in the visit note. 

## 2016-10-04 NOTE — Patient Instructions (Addendum)
You have a viral upper respiratory infection. Antibiotics are not needed for this.  Viral infections usually take 7-10 days to resolve.  The cough can last a few weeks to go away.  May take mucinex once in the morning and dimetapp or delsym over the counter at night to help suppress cough.  Push fluids and plenty of rest. Please return if you are not improving as expected, or if you have high fevers (>101.5) or difficulty swallowing or worsening productive cough. Call clinic with questions.  Good to see you today. I hope you start feeling better soon.

## 2016-10-27 ENCOUNTER — Ambulatory Visit (INDEPENDENT_AMBULATORY_CARE_PROVIDER_SITE_OTHER): Payer: PPO

## 2016-10-27 DIAGNOSIS — Z5181 Encounter for therapeutic drug level monitoring: Secondary | ICD-10-CM | POA: Diagnosis not present

## 2016-10-27 LAB — POCT INR: INR: 3

## 2016-10-27 NOTE — Patient Instructions (Signed)
Pre visit review using our clinic review tool, if applicable. No additional management support is needed unless otherwise documented below in the visit note. 

## 2016-10-30 ENCOUNTER — Other Ambulatory Visit: Payer: Self-pay | Admitting: Family Medicine

## 2016-11-24 ENCOUNTER — Ambulatory Visit (INDEPENDENT_AMBULATORY_CARE_PROVIDER_SITE_OTHER): Payer: PPO

## 2016-11-24 DIAGNOSIS — Z5181 Encounter for therapeutic drug level monitoring: Secondary | ICD-10-CM

## 2016-11-24 LAB — POCT INR: INR: 3.7

## 2016-11-24 NOTE — Patient Instructions (Signed)
Pre visit review using our clinic review tool, if applicable. No additional management support is needed unless otherwise documented below in the visit note.  INR today 3.7  Patient, wife and I reviewed extensively his diet, medication and health history.  They cannot identify any change or deviation other than wife mixed up his 1/2 day this week and gave him 1 whole on Wednesday and 1/2 pill on Thursday.  But, same total amount of weekly coumadin was administered.  Patient has been healthy but does note a recent nosebleed which is unusual for him, he was able to control it and no further treatment was required.  Due to supratherapeutic level, patient instructed to hold coumadin today only and then resume taking a decreased dosing of 1 pill daily EXCEPT for 1/2 pill now on Mondays and Fridays.  Recheck set up for 12/15/16.  Patient and wife educated on risks associated with elevated level and informed to go to ER if any unusual or uncontrollable bleeding develops.  Written instructions given and wife verbalizes understanding.

## 2016-12-15 ENCOUNTER — Ambulatory Visit (INDEPENDENT_AMBULATORY_CARE_PROVIDER_SITE_OTHER): Payer: PPO

## 2016-12-15 DIAGNOSIS — Z5181 Encounter for therapeutic drug level monitoring: Secondary | ICD-10-CM | POA: Diagnosis not present

## 2016-12-15 LAB — POCT INR: INR: 2.7

## 2016-12-15 NOTE — Patient Instructions (Signed)
Pre visit review using our clinic review tool, if applicable. No additional management support is needed unless otherwise documented below in the visit note. 

## 2016-12-26 DIAGNOSIS — G894 Chronic pain syndrome: Secondary | ICD-10-CM | POA: Diagnosis not present

## 2016-12-26 DIAGNOSIS — M5442 Lumbago with sciatica, left side: Secondary | ICD-10-CM | POA: Diagnosis not present

## 2016-12-26 DIAGNOSIS — G8929 Other chronic pain: Secondary | ICD-10-CM | POA: Diagnosis not present

## 2016-12-26 DIAGNOSIS — M5136 Other intervertebral disc degeneration, lumbar region: Secondary | ICD-10-CM | POA: Diagnosis not present

## 2017-01-12 ENCOUNTER — Ambulatory Visit: Payer: PPO

## 2017-01-15 ENCOUNTER — Ambulatory Visit: Payer: Self-pay | Admitting: Neurology

## 2017-01-16 ENCOUNTER — Ambulatory Visit: Payer: PPO

## 2017-01-17 ENCOUNTER — Ambulatory Visit: Payer: PPO

## 2017-01-19 ENCOUNTER — Ambulatory Visit (INDEPENDENT_AMBULATORY_CARE_PROVIDER_SITE_OTHER): Payer: PPO

## 2017-01-19 ENCOUNTER — Encounter: Payer: Self-pay | Admitting: Neurology

## 2017-01-19 DIAGNOSIS — Z5181 Encounter for therapeutic drug level monitoring: Secondary | ICD-10-CM | POA: Diagnosis not present

## 2017-01-19 LAB — POCT INR: INR: 2.2

## 2017-01-19 NOTE — Patient Instructions (Signed)
Pre visit review using our clinic review tool, if applicable. No additional management support is needed unless otherwise documented below in the visit note. 

## 2017-02-15 ENCOUNTER — Encounter (HOSPITAL_COMMUNITY): Payer: Self-pay | Admitting: Emergency Medicine

## 2017-02-15 ENCOUNTER — Emergency Department (HOSPITAL_COMMUNITY): Payer: PPO

## 2017-02-15 ENCOUNTER — Inpatient Hospital Stay (HOSPITAL_COMMUNITY)
Admission: EM | Admit: 2017-02-15 | Discharge: 2017-02-19 | DRG: 152 | Disposition: A | Discharge status: Home Health | Payer: PPO | Attending: Internal Medicine | Admitting: Internal Medicine

## 2017-02-15 DIAGNOSIS — F0150 Vascular dementia without behavioral disturbance: Secondary | ICD-10-CM | POA: Diagnosis present

## 2017-02-15 DIAGNOSIS — R509 Fever, unspecified: Secondary | ICD-10-CM | POA: Diagnosis not present

## 2017-02-15 DIAGNOSIS — Z86711 Personal history of pulmonary embolism: Secondary | ICD-10-CM | POA: Diagnosis not present

## 2017-02-15 DIAGNOSIS — Z79899 Other long term (current) drug therapy: Secondary | ICD-10-CM

## 2017-02-15 DIAGNOSIS — E118 Type 2 diabetes mellitus with unspecified complications: Secondary | ICD-10-CM | POA: Diagnosis present

## 2017-02-15 DIAGNOSIS — M545 Low back pain: Secondary | ICD-10-CM | POA: Diagnosis not present

## 2017-02-15 DIAGNOSIS — J069 Acute upper respiratory infection, unspecified: Principal | ICD-10-CM | POA: Diagnosis present

## 2017-02-15 DIAGNOSIS — R0600 Dyspnea, unspecified: Secondary | ICD-10-CM

## 2017-02-15 DIAGNOSIS — M109 Gout, unspecified: Secondary | ICD-10-CM | POA: Diagnosis present

## 2017-02-15 DIAGNOSIS — G4730 Sleep apnea, unspecified: Secondary | ICD-10-CM | POA: Diagnosis present

## 2017-02-15 DIAGNOSIS — I10 Essential (primary) hypertension: Secondary | ICD-10-CM | POA: Diagnosis present

## 2017-02-15 DIAGNOSIS — Z8249 Family history of ischemic heart disease and other diseases of the circulatory system: Secondary | ICD-10-CM

## 2017-02-15 DIAGNOSIS — R531 Weakness: Secondary | ICD-10-CM | POA: Diagnosis not present

## 2017-02-15 DIAGNOSIS — Q211 Atrial septal defect: Secondary | ICD-10-CM | POA: Diagnosis not present

## 2017-02-15 DIAGNOSIS — E669 Obesity, unspecified: Secondary | ICD-10-CM | POA: Diagnosis not present

## 2017-02-15 DIAGNOSIS — G8929 Other chronic pain: Secondary | ICD-10-CM | POA: Diagnosis not present

## 2017-02-15 DIAGNOSIS — R400 Somnolence: Secondary | ICD-10-CM | POA: Diagnosis not present

## 2017-02-15 DIAGNOSIS — Z86718 Personal history of other venous thrombosis and embolism: Secondary | ICD-10-CM

## 2017-02-15 DIAGNOSIS — R001 Bradycardia, unspecified: Secondary | ICD-10-CM | POA: Diagnosis not present

## 2017-02-15 DIAGNOSIS — Z833 Family history of diabetes mellitus: Secondary | ICD-10-CM

## 2017-02-15 DIAGNOSIS — E8881 Metabolic syndrome: Secondary | ICD-10-CM | POA: Diagnosis present

## 2017-02-15 DIAGNOSIS — D6859 Other primary thrombophilia: Secondary | ICD-10-CM | POA: Diagnosis present

## 2017-02-15 DIAGNOSIS — E782 Mixed hyperlipidemia: Secondary | ICD-10-CM | POA: Diagnosis not present

## 2017-02-15 DIAGNOSIS — Z888 Allergy status to other drugs, medicaments and biological substances status: Secondary | ICD-10-CM

## 2017-02-15 DIAGNOSIS — R410 Disorientation, unspecified: Secondary | ICD-10-CM | POA: Diagnosis not present

## 2017-02-15 DIAGNOSIS — B9789 Other viral agents as the cause of diseases classified elsewhere: Secondary | ICD-10-CM | POA: Diagnosis not present

## 2017-02-15 DIAGNOSIS — Z7901 Long term (current) use of anticoagulants: Secondary | ICD-10-CM

## 2017-02-15 DIAGNOSIS — J441 Chronic obstructive pulmonary disease with (acute) exacerbation: Secondary | ICD-10-CM | POA: Diagnosis present

## 2017-02-15 DIAGNOSIS — R6510 Systemic inflammatory response syndrome (SIRS) of non-infectious origin without acute organ dysfunction: Secondary | ICD-10-CM | POA: Diagnosis not present

## 2017-02-15 DIAGNOSIS — Z8672 Personal history of thrombophlebitis: Secondary | ICD-10-CM

## 2017-02-15 DIAGNOSIS — M549 Dorsalgia, unspecified: Secondary | ICD-10-CM

## 2017-02-15 DIAGNOSIS — G9341 Metabolic encephalopathy: Secondary | ICD-10-CM | POA: Diagnosis present

## 2017-02-15 DIAGNOSIS — I6789 Other cerebrovascular disease: Secondary | ICD-10-CM | POA: Diagnosis not present

## 2017-02-15 DIAGNOSIS — R404 Transient alteration of awareness: Secondary | ICD-10-CM | POA: Diagnosis not present

## 2017-02-15 DIAGNOSIS — Z8673 Personal history of transient ischemic attack (TIA), and cerebral infarction without residual deficits: Secondary | ICD-10-CM

## 2017-02-15 DIAGNOSIS — R4182 Altered mental status, unspecified: Secondary | ICD-10-CM | POA: Diagnosis present

## 2017-02-15 DIAGNOSIS — Z6839 Body mass index (BMI) 39.0-39.9, adult: Secondary | ICD-10-CM | POA: Diagnosis not present

## 2017-02-15 DIAGNOSIS — G459 Transient cerebral ischemic attack, unspecified: Secondary | ICD-10-CM | POA: Diagnosis not present

## 2017-02-15 DIAGNOSIS — Z825 Family history of asthma and other chronic lower respiratory diseases: Secondary | ICD-10-CM

## 2017-02-15 LAB — COMPREHENSIVE METABOLIC PANEL
ALT: 21 U/L (ref 17–63)
AST: 30 U/L (ref 15–41)
Albumin: 3.9 g/dL (ref 3.5–5.0)
Alkaline Phosphatase: 60 U/L (ref 38–126)
Anion gap: 17 — ABNORMAL HIGH (ref 5–15)
BUN: 13 mg/dL (ref 6–20)
CO2: 22 mmol/L (ref 22–32)
Calcium: 9 mg/dL (ref 8.9–10.3)
Chloride: 99 mmol/L — ABNORMAL LOW (ref 101–111)
Creatinine, Ser: 1.02 mg/dL (ref 0.61–1.24)
GFR calc Af Amer: >60 mL/min (ref >60)
Glucose, Bld: 128 mg/dL — ABNORMAL HIGH (ref 65–99)
Potassium: 4.4 mmol/L (ref 3.5–5.1)
SODIUM: 138 mmol/L (ref 135–145)
Total Bilirubin: 1.6 mg/dL — ABNORMAL HIGH (ref 0.3–1.2)
Total Protein: 6.8 g/dL (ref 6.5–8.1)

## 2017-02-15 LAB — I-STAT CHEM 8, ED
BUN: 14 mg/dL (ref 6–20)
Calcium, Ion: 0.98 mmol/L — ABNORMAL LOW (ref 1.15–1.40)
Chloride: 103 mmol/L (ref 101–111)
Creatinine, Ser: 0.9 mg/dL (ref 0.61–1.24)
Glucose, Bld: 132 mg/dL — ABNORMAL HIGH (ref 65–99)
HCT: 43 % (ref 39.0–52.0)
HEMOGLOBIN: 14.6 g/dL (ref 13.0–17.0)
POTASSIUM: 4.2 mmol/L (ref 3.5–5.1)
SODIUM: 137 mmol/L (ref 135–145)
TCO2: 26 mmol/L (ref 0–100)

## 2017-02-15 LAB — CBC
HCT: 40.9 % (ref 39.0–52.0)
Hemoglobin: 13.2 g/dL (ref 13.0–17.0)
MCH: 29.6 pg (ref 26.0–34.0)
MCHC: 32.3 g/dL (ref 30.0–36.0)
MCV: 91.7 fL (ref 78.0–100.0)
PLATELETS: 137 10*3/uL — AB (ref 150–400)
RBC: 4.46 MIL/uL (ref 4.22–5.81)
RDW: 13.5 % (ref 11.5–15.5)
WBC: 11.6 10*3/uL — AB (ref 4.0–10.5)

## 2017-02-15 LAB — DIFFERENTIAL
BASOS ABS: 0 10*3/uL (ref 0.0–0.1)
Basophils Relative: 0 %
EOS ABS: 0.1 10*3/uL (ref 0.0–0.7)
Eosinophils Relative: 1 %
Lymphocytes Relative: 13 %
Lymphs Abs: 1.5 10*3/uL (ref 0.7–4.0)
MONOS PCT: 4 %
Monocytes Absolute: 0.5 10*3/uL (ref 0.1–1.0)
NEUTROS ABS: 9.6 10*3/uL — AB (ref 1.7–7.7)
Neutrophils Relative %: 82 %

## 2017-02-15 LAB — PROTIME-INR
INR: 1.98
PROTHROMBIN TIME: 22.8 s — AB (ref 11.4–15.2)

## 2017-02-15 LAB — APTT: APTT: 37 s — AB (ref 24–36)

## 2017-02-15 LAB — CBG MONITORING, ED: Glucose-Capillary: 134 mg/dL — ABNORMAL HIGH (ref 65–99)

## 2017-02-15 LAB — I-STAT TROPONIN, ED: TROPONIN I, POC: 0 ng/mL (ref 0.00–0.08)

## 2017-02-15 LAB — I-STAT CG4 LACTIC ACID, ED: Lactic Acid, Venous: 1.56 mmol/L (ref 0.5–1.9)

## 2017-02-15 NOTE — ED Provider Notes (Signed)
[facility]-EMERGENCY DEPT Provider Note   CSN: [id number] Arrival date & time: 02/15/17  2315  By signing my name below, I, Elder Negus, attest that this documentation has been prepared under the direction and in the presence of Said Rueb, MD. Electronically Signed: Elder Negus, Scribe. 02/15/17. 11:30 PM.   History   Chief Complaint No chief complaint on file.  LEVEL 5 CAVEAT DUE TO DEMENTIA  HPI Logan Valdez. is a 71 y.o. 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 According to medic report, this patient was last normal at 1700. Since that time he has been confused with family. EMS witnessed a slight L sided facial droop. No other deficits. Was febrile to 102.51F temporal en route.    At interview, the patient is unable to participate due to dementia. A complete history is limited.   The history is provided by the EMS personnel. No language interpreter was used.  Altered Mental Status   This is a new problem. The current episode started 6 to 12 hours ago. The problem has not changed since onset.Associated symptoms include confusion. Risk factors: none. His past medical history does not include AIDS.    Past Medical History:  Diagnosis Date  . Chronic lower back pain    [place]  . DDD (degenerative disc disease), lumbar   . Dysmetabolic syndrome X   . Gout, unspecified   . History of DVT (deep vein thrombosis)   . History of pulmonary embolism   . History of stroke 2009   [place]  . History of thrombophlebitis   . HTN (hypertension)   . Mixed hyperlipidemia   . Obesity   . PFO (patent foramen ovale)   . Primary hypercoagulable state (HCC) 05/14/2008   Personal h/o DVT, PE, PFO, and lupus AC +   . Type II or unspecified type diabetes mellitus without mention of complication, uncontrolled   . Unspecified sleep apnea     Patient Active Problem List   Diagnosis Date Noted  .  Viral URI with cough 10/04/2016  . Poor urinary stream 05/06/2016  . Urge incontinence 05/06/2016  . History of stroke 01/12/2016  . Chronic back pain 09/07/2015  . Leg cramps 06/22/2015  . Drug-induced constipation with proper administration 04/12/2015  . 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 Heme positive stool 01/13/2015  . Medicare annual wellness visit, subsequent 12/22/2014  . Advanced care planning/counseling discussion 12/22/2014  . B12 deficiency 12/13/2014  . Vascular dementia 12/09/2014  . Encounter for therapeutic drug monitoring 12/17/2013  . 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 HTN (hypertension) 09/18/2012  . PATENT FORAMEN OVALE 04/08/2009  . SLEEP APNEA 04/08/2009  . COMPRESSION FRACTURE, L4 VERTEBRA 11/04/2008  . Diabetes mellitus type 2, controlled, with complications (HCC) 05/14/2008  . Primary hypercoagulable state (HCC) 05/14/2008  . Lumbar radiculopathy 05/14/2008  . Gout 12/09/2007  . METABOLIC SYNDROME X 12/09/2007  . Obesity, Class III, BMI 40-49.9 (morbid obesity) (HCC) 12/09/2007  . PULMONARY EMBOLISM, HX OF 12/09/2007  . DEEP VENOUS THROMBOPHLEBITIS, HX OF 12/09/2007  . HYPERLIPIDEMIA 12/05/2007    Past Surgical History:  Procedure Laterality Date  . COLONOSCOPY  2000   [facility]  . WRIST SURGERY     Right wrist repair post trauma       Home Medications    Prior to Admission medications   Medication Sig Start Date End Date Taking?  Authorizing Provider  acetaminophen (TYLENOL) 500 MG tablet Take 500 mg by mouth 3 (three) times daily.    Historical Provider, MD  atorvastatin (LIPITOR) 20 MG tablet TAKE 1 TABLET BY MOUTH DAILY 06/12/16   Eustaquio Boyden, MD  docusate sodium (COLACE) 100 MG capsule Take 100 mg by mouth daily as needed for mild constipation.    Historical Provider, MD  finasteride (PROSCAR) 5 MG tablet TAKE 1 TABLET BY MOUTH DAILY 10/31/16   Eustaquio Boyden, MD    gabapentin (NEURONTIN) 300 MG capsule TAKE ONE CAPSULE BY MOUTH THREE TIMES DAILY 10/04/16   Eustaquio Boyden, MD  HYDROcodone-acetaminophen [facility]) 10-325 MG per tablet Take 1 tablet by mouth every 6 (six) hours as needed.      Historical Provider, MD  L-Methylfolate-B6-B12 [facility]) 3-35-2 MG TABS TAKE 1 TABLET BY MOUTH TWICE A DAY 08/02/15   Nilda Riggs, NP  memantine (NAMENDA) 10 MG tablet Take 1 tablet (10 mg total) by mouth 2 (two) times daily. 07/14/16   Nilda Riggs, NP  metoprolol tartrate (LOPRESSOR) 25 MG tablet TAKE 1 TABLET BY MOUTH TWICE A DAY 10/31/16   Eustaquio Boyden, MD  polyethylene glycol powder (GLYCOLAX/MIRALAX) powder DISSOLVE ONE (1) CAPFUL (17GM) INTO 4 TO8 OUNCES OF FLUID AND TAKE BYMOUTH ONCE DAILY AS DIRECTED 06/29/16   Eustaquio Boyden, MD  triamcinolone cream (KENALOG) 0.1 % Apply 1 application topically 2 (two) times daily. Apply to AA. 10/15/15   Eustaquio Boyden, MD  warfarin (COUMADIN) 5 MG tablet TAKE AS DIRECTED BY COUMADIN CLINIC 10/31/16   Eustaquio Boyden, MD    Family History Family History  Problem Relation Age of Onset  . Emphysema Father   . Diabetes Brother   . Hypertension Brother   . Heart attack Brother 43  . Skin cancer Brother   . Lung cancer Brother   . Cancer Maternal Aunt     ? Type   . Stroke Neg Hx     Social History Social History  Substance Use Topics  . Smoking status: Never Smoker  . Smokeless tobacco: Never Used  . Alcohol use No     Allergies   Hydrochlorothiazide; Indomethacin; Pravastatin sodium; Lisinopril; and Micardis [telmisartan]   Review of Systems Review of Systems  Unable to perform ROS: Dementia  Psychiatric/Behavioral: Positive for confusion.     Physical Exam Updated Vital Signs There were no vitals taken for this visit.  Physical Exam  Constitutional: He appears well-developed and well-nourished.  HENT:  Head: Normocephalic and atraumatic.  Right Ear: External ear normal.   Left Ear: External ear normal.  Mouth/Throat: Oropharynx is clear and moist. No oropharyngeal exudate.  Eyes: Conjunctivae and EOM are normal. Pupils are equal, round, and reactive to light.  Neck: Normal range of motion. Neck supple.  Cardiovascular: Normal rate, regular rhythm and intact distal pulses.   No murmur heard. 3/6 systolic ejection murmur. 1/6 diastolic ejection murmur.  Pulmonary/Chest: Effort normal and breath sounds normal. No stridor. No respiratory distress. He has no wheezes. He has no rales.  Abdominal: Soft. He exhibits no mass. There is no tenderness. There is no rebound and no guarding.  Musculoskeletal: He exhibits no edema or deformity.  Neurological: He is alert.  Babinski's sign upward on Right, downward on Left. Tremors on Right.   Skin: Skin is warm and dry. Capillary refill takes less than 2 seconds.  Psychiatric: He has a normal mood and affect.  Nursing note and vitals reviewed.    ED Treatments / Results  Vitals:   02/16/17 0230 02/16/17 0245  BP: 133/60 (!) 141/95  Pulse: 70 71  Resp: (!) 23 (!) 24  Temp:      Labs (all labs ordered are listed, but only abnormal results are displayed)  Results for orders placed or performed during the hospital encounter of 02/15/17  Protime-INR  Result Value Ref Range   Prothrombin Time 22.8 (H) 11.4 - 15.2 seconds   INR 1.98   APTT  Result Value Ref Range   aPTT 37 (H) 24 - 36 seconds  CBC  Result Value Ref Range   WBC 11.6 (H) 4.0 - 10.5 K/uL   RBC 4.46 4.22 - 5.81 MIL/uL   Hemoglobin 13.2 13.0 - 17.0 g/dL   HCT 16.1 09.6 - 04.5 %   MCV 91.7 78.0 - 100.0 fL   MCH 29.6 26.0 - 34.0 pg   MCHC 32.3 30.0 - 36.0 g/dL   RDW 40.9 81.1 - 91.4 %   Platelets 137 (L) 150 - 400 K/uL  Differential  Result Value Ref Range   Neutrophils Relative % 82 %   Neutro Abs 9.6 (H) 1.7 - 7.7 K/uL   Lymphocytes Relative 13 %   Lymphs Abs 1.5 0.7 - 4.0 K/uL   Monocytes Relative 4 %   Monocytes Absolute 0.5 0.1 -  1.0 K/uL   Eosinophils Relative 1 %   Eosinophils Absolute 0.1 0.0 - 0.7 K/uL   Basophils Relative 0 %   Basophils Absolute 0.0 0.0 - 0.1 K/uL  Comprehensive metabolic panel  Result Value Ref Range   Sodium 138 135 - 145 mmol/L   Potassium 4.4 3.5 - 5.1 mmol/L   Chloride 99 (L) 101 - 111 mmol/L   CO2 22 22 - 32 mmol/L   Glucose, Bld 128 (H) 65 - 99 mg/dL   BUN 13 6 - 20 mg/dL   Creatinine, Ser 7.82 0.61 - 1.24 mg/dL   Calcium 9.0 8.9 - 95.6 mg/dL   Total Protein 6.8 6.5 - 8.1 g/dL   Albumin 3.9 3.5 - 5.0 g/dL   AST 30 15 - 41 U/L   ALT 21 17 - 63 U/L   Alkaline Phosphatase 60 38 - 126 U/L   Total Bilirubin 1.6 (H) 0.3 - 1.2 mg/dL   GFR calc non Af Amer >60 >60 mL/min   GFR calc Af Amer >60 >60 mL/min   Anion gap 17 (H) 5 - 15  Urinalysis, Routine w reflex microscopic  Result Value Ref Range   Color, Urine YELLOW YELLOW   APPearance CLEAR CLEAR   Specific Gravity, Urine 1.023 1.005 - 1.030   pH 6.0 5.0 - 8.0   Glucose, UA NEGATIVE NEGATIVE mg/dL   Hgb urine dipstick NEGATIVE NEGATIVE   Bilirubin Urine NEGATIVE NEGATIVE   Ketones, ur 5 (A) NEGATIVE mg/dL   Protein, ur NEGATIVE NEGATIVE mg/dL   Nitrite NEGATIVE NEGATIVE   Leukocytes, UA NEGATIVE NEGATIVE  I-stat troponin, ED  Result Value Ref Range   Troponin i, poc 0.00 0.00 - 0.08 ng/mL   Comment 3          CBG monitoring, ED  Result Value Ref Range   Glucose-Capillary 134 (H) 65 - 99 mg/dL  I-Stat Chem 8, ED  Result Value Ref Range   Sodium 137 135 - 145 mmol/L   Potassium 4.2 3.5 - 5.1 mmol/L   Chloride 103 101 - 111 mmol/L   BUN 14 6 - 20 mg/dL   Creatinine, Ser 2.13 0.61 - 1.24 mg/dL  Glucose, Bld 132 (H) 65 - 99 mg/dL   Calcium, Ion 4.09 (L) 1.15 - 1.40 mmol/L   TCO2 26 0 - 100 mmol/L   Hemoglobin 14.6 13.0 - 17.0 g/dL   HCT 81.1 91.4 - 78.2 %  I-Stat CG4 Lactic Acid, ED  (not at  [facility])  Result Value Ref Range   Lactic Acid, Venous 1.56 0.5 - 1.9 mmol/L  I-Stat CG4 Lactic Acid, ED  (not at  [facility])   Result Value Ref Range   Lactic Acid, Venous 1.35 0.5 - 1.9 mmol/L   Dg Chest Portable 1 View  Result Date: 02/16/2017 CLINICAL DATA:  Difficulty standing and ambulating.  Fever. EXAM: PORTABLE CHEST 1 VIEW COMPARISON:  01/09/2016 CXR FINDINGS: Low lung volumes. Borderline cardiomegaly. Aortic atherosclerosis. No pneumonic consolidation, CHF nor effusion. No acute osseous appearing abnormality. IMPRESSION: No active disease.  Aortic atherosclerosis. Electronically Signed   By: Tollie Eth M.D.   On: 02/16/2017 00:15   Ct Head Code Stroke W/o Cm  Result Date: 02/15/2017 CLINICAL DATA:  Code stroke. Initial evaluation for acute left arm weakness. EXAM: CT HEAD WITHOUT CONTRAST TECHNIQUE: Contiguous axial images were obtained from the base of the skull through the vertex without intravenous contrast. COMPARISON:  Prior CT from 08/31/2015. FINDINGS: Brain: Generalized cerebral atrophy with mild chronic small vessel ischemic disease. Encephalomalacia within the left occipital region compatible with remote left PCA territory infarct. No acute intracranial hemorrhage. No evidence for acute large vessel territory infarct. No mass lesion, midline shift or mass effect. No hydrocephalus. No extra-axial fluid collection. Vascular: No hyperdense vessel. Scattered vascular calcifications noted within the carotid siphons. Skull: Scalp soft tissues within normal limits.  Calvarium intact. Sinuses/Orbits: Visualized globes and orbital soft tissues within normal limits. Scattered mucosal thickening within the ethmoidal air cells. Paranasal sinuses otherwise clear. No mastoid effusion. ASPECTS [facility] Stroke Program Early CT Score) - Ganglionic level infarction (caudate, lentiform nuclei, internal capsule, insula, M1-M3 cortex): 7 - Supraganglionic infarction (M4-M6 cortex): 3 Total score (0-10 with 10 being normal): 10 IMPRESSION: 1. No acute intracranial infarct or other process identified. 2. ASPECTS is 10 3. Stable  atrophy with chronic microvascular ischemic disease and remote left PCA territory infarct. Critical Value/emergent results were called by telephone at the time of interpretation on 02/15/2017 at 11:53 pm to Dr. Amada Jupiter, who verbally acknowledged these results. Electronically Signed   By: Rise Mu M.D.   On: 02/15/2017 23:56      EKG Interpretation  Date/Time:  Thursday Tylie Golonka 12 2018 23:35:37 EDT Ventricular Rate:  82 PR Interval:    QRS Duration: 83 QT Interval:  382 QTC Calculation: 447 R Axis:   49 Text Interpretation:  Sinus rhythm Confirmed by [facility]  MD, Chanise Habeck ([id number]) on 02/15/2017 11:56:08 PM       Procedures Procedures (including critical care time)  Medications Ordered in ED Medications  vancomycin (VANCOCIN) IVPB 1000 mg/200 mL premix (1,000 mg Intravenous New Bag/Given 02/16/17 0229)  piperacillin-tazobactam (ZOSYN) IVPB 3.375 g (3.375 g Intravenous New Bag/Given 02/16/17 0229)   Code stroke was canceled  Final Clinical Impressions(s) / ED Diagnoses  Altered mental status with fever, SIRS.  Will admit   I personally performed the services described in this documentation, which was scribed in my presence. The recorded information has been reviewed and is accurate.      Cy Blamer, MD 02/16/17 [phone]

## 2017-02-15 NOTE — ED Triage Notes (Signed)
Pt BIB EMS from home. EMS called by family who noticed pt having difficulty standing up and ambulating. LKW 1700 today. Hx of previous CVA with memory deficits only. EMS reports L sided facial droop, confusion on scene. EMS states became more oriented during transport. EMS also reported temp of 102F.

## 2017-02-16 ENCOUNTER — Emergency Department (HOSPITAL_COMMUNITY): Payer: PPO

## 2017-02-16 ENCOUNTER — Observation Stay (HOSPITAL_COMMUNITY): Payer: PPO

## 2017-02-16 ENCOUNTER — Ambulatory Visit: Payer: PPO

## 2017-02-16 ENCOUNTER — Encounter (HOSPITAL_COMMUNITY): Payer: Self-pay | Admitting: Emergency Medicine

## 2017-02-16 DIAGNOSIS — Q211 Atrial septal defect: Secondary | ICD-10-CM | POA: Diagnosis not present

## 2017-02-16 DIAGNOSIS — E782 Mixed hyperlipidemia: Secondary | ICD-10-CM | POA: Diagnosis not present

## 2017-02-16 DIAGNOSIS — R509 Fever, unspecified: Secondary | ICD-10-CM | POA: Diagnosis not present

## 2017-02-16 DIAGNOSIS — Z86718 Personal history of other venous thrombosis and embolism: Secondary | ICD-10-CM | POA: Diagnosis not present

## 2017-02-16 DIAGNOSIS — M549 Dorsalgia, unspecified: Secondary | ICD-10-CM | POA: Diagnosis not present

## 2017-02-16 DIAGNOSIS — D6859 Other primary thrombophilia: Secondary | ICD-10-CM | POA: Diagnosis not present

## 2017-02-16 DIAGNOSIS — E118 Type 2 diabetes mellitus with unspecified complications: Secondary | ICD-10-CM

## 2017-02-16 DIAGNOSIS — R0600 Dyspnea, unspecified: Secondary | ICD-10-CM | POA: Diagnosis not present

## 2017-02-16 DIAGNOSIS — F0150 Vascular dementia without behavioral disturbance: Secondary | ICD-10-CM

## 2017-02-16 DIAGNOSIS — M109 Gout, unspecified: Secondary | ICD-10-CM | POA: Diagnosis not present

## 2017-02-16 DIAGNOSIS — R001 Bradycardia, unspecified: Secondary | ICD-10-CM | POA: Diagnosis not present

## 2017-02-16 DIAGNOSIS — M545 Low back pain: Secondary | ICD-10-CM | POA: Diagnosis not present

## 2017-02-16 DIAGNOSIS — J441 Chronic obstructive pulmonary disease with (acute) exacerbation: Secondary | ICD-10-CM | POA: Diagnosis not present

## 2017-02-16 DIAGNOSIS — R410 Disorientation, unspecified: Secondary | ICD-10-CM

## 2017-02-16 DIAGNOSIS — J069 Acute upper respiratory infection, unspecified: Secondary | ICD-10-CM | POA: Diagnosis not present

## 2017-02-16 DIAGNOSIS — Z8672 Personal history of thrombophlebitis: Secondary | ICD-10-CM | POA: Diagnosis not present

## 2017-02-16 DIAGNOSIS — B9789 Other viral agents as the cause of diseases classified elsewhere: Secondary | ICD-10-CM | POA: Diagnosis not present

## 2017-02-16 DIAGNOSIS — G4730 Sleep apnea, unspecified: Secondary | ICD-10-CM | POA: Diagnosis not present

## 2017-02-16 DIAGNOSIS — R4182 Altered mental status, unspecified: Secondary | ICD-10-CM | POA: Diagnosis present

## 2017-02-16 DIAGNOSIS — I10 Essential (primary) hypertension: Secondary | ICD-10-CM | POA: Diagnosis not present

## 2017-02-16 DIAGNOSIS — G8929 Other chronic pain: Secondary | ICD-10-CM | POA: Diagnosis not present

## 2017-02-16 DIAGNOSIS — J449 Chronic obstructive pulmonary disease, unspecified: Secondary | ICD-10-CM | POA: Diagnosis not present

## 2017-02-16 DIAGNOSIS — Z86711 Personal history of pulmonary embolism: Secondary | ICD-10-CM | POA: Diagnosis not present

## 2017-02-16 DIAGNOSIS — G9341 Metabolic encephalopathy: Secondary | ICD-10-CM | POA: Diagnosis not present

## 2017-02-16 DIAGNOSIS — R6510 Systemic inflammatory response syndrome (SIRS) of non-infectious origin without acute organ dysfunction: Secondary | ICD-10-CM | POA: Diagnosis not present

## 2017-02-16 DIAGNOSIS — E8881 Metabolic syndrome: Secondary | ICD-10-CM | POA: Diagnosis not present

## 2017-02-16 DIAGNOSIS — Z6839 Body mass index (BMI) 39.0-39.9, adult: Secondary | ICD-10-CM | POA: Diagnosis not present

## 2017-02-16 DIAGNOSIS — Z8249 Family history of ischemic heart disease and other diseases of the circulatory system: Secondary | ICD-10-CM | POA: Diagnosis not present

## 2017-02-16 DIAGNOSIS — Z8673 Personal history of transient ischemic attack (TIA), and cerebral infarction without residual deficits: Secondary | ICD-10-CM | POA: Diagnosis not present

## 2017-02-16 DIAGNOSIS — E669 Obesity, unspecified: Secondary | ICD-10-CM | POA: Diagnosis not present

## 2017-02-16 LAB — RESPIRATORY PANEL BY PCR
ADENOVIRUS-RVPPCR: NOT DETECTED
Bordetella pertussis: NOT DETECTED
CHLAMYDOPHILA PNEUMONIAE-RVPPCR: NOT DETECTED
CORONAVIRUS HKU1-RVPPCR: NOT DETECTED
CORONAVIRUS NL63-RVPPCR: NOT DETECTED
Coronavirus 229E: NOT DETECTED
Coronavirus OC43: NOT DETECTED
INFLUENZA A-RVPPCR: NOT DETECTED
Influenza B: NOT DETECTED
MYCOPLASMA PNEUMONIAE-RVPPCR: NOT DETECTED
Metapneumovirus: NOT DETECTED
PARAINFLUENZA VIRUS 4-RVPPCR: NOT DETECTED
Parainfluenza Virus 1: NOT DETECTED
Parainfluenza Virus 2: NOT DETECTED
Parainfluenza Virus 3: NOT DETECTED
Respiratory Syncytial Virus: NOT DETECTED
Rhinovirus / Enterovirus: DETECTED — AB

## 2017-02-16 LAB — PROCALCITONIN

## 2017-02-16 LAB — URINALYSIS, ROUTINE W REFLEX MICROSCOPIC
Bilirubin Urine: NEGATIVE
Glucose, UA: NEGATIVE mg/dL
Hgb urine dipstick: NEGATIVE
Ketones, ur: 5 mg/dL — AB
Leukocytes, UA: NEGATIVE
NITRITE: NEGATIVE
PH: 6 (ref 5.0–8.0)
Protein, ur: NEGATIVE mg/dL
SPECIFIC GRAVITY, URINE: 1.023 (ref 1.005–1.030)

## 2017-02-16 LAB — I-STAT CG4 LACTIC ACID, ED: LACTIC ACID, VENOUS: 1.35 mmol/L (ref 0.5–1.9)

## 2017-02-16 MED ORDER — VANCOMYCIN HCL IN DEXTROSE 1-5 GM/200ML-% IV SOLN
1000.0000 mg | Freq: Once | INTRAVENOUS | Status: AC
  Administered 2017-02-16: 1000 mg via INTRAVENOUS
  Filled 2017-02-16: qty 200

## 2017-02-16 MED ORDER — PANTOPRAZOLE SODIUM 40 MG PO TBEC
40.0000 mg | DELAYED_RELEASE_TABLET | Freq: Every day | ORAL | Status: DC
  Administered 2017-02-16 – 2017-02-19 (×4): 40 mg via ORAL
  Filled 2017-02-16 (×4): qty 1

## 2017-02-16 MED ORDER — ONDANSETRON HCL 4 MG/2ML IJ SOLN
4.0000 mg | Freq: Four times a day (QID) | INTRAMUSCULAR | Status: DC | PRN
Start: 2017-02-16 — End: 2017-02-19
  Administered 2017-02-16: 4 mg via INTRAVENOUS
  Filled 2017-02-16: qty 2

## 2017-02-16 MED ORDER — ACETAMINOPHEN 650 MG RE SUPP: 650.0000 mg | Freq: Four times a day (QID) | RECTAL | Status: DC | PRN

## 2017-02-16 MED ORDER — SODIUM CHLORIDE 0.9 % IV SOLN
INTRAVENOUS | Status: DC
  Administered 2017-02-16: 1000 mL via INTRAVENOUS

## 2017-02-16 MED ORDER — ATORVASTATIN CALCIUM 20 MG PO TABS
20.0000 mg | ORAL_TABLET | Freq: Every day | ORAL | Status: DC
  Administered 2017-02-17 – 2017-02-19 (×3): 20 mg via ORAL
  Filled 2017-02-16 (×3): qty 1

## 2017-02-16 MED ORDER — ACETAMINOPHEN 325 MG PO TABS
650.0000 mg | ORAL_TABLET | Freq: Four times a day (QID) | ORAL | Status: DC | PRN
  Filled 2017-02-16: qty 2

## 2017-02-16 MED ORDER — WARFARIN SODIUM 2.5 MG PO TABS
2.5000 mg | ORAL_TABLET | ORAL | Status: DC
  Administered 2017-02-16: 2.5 mg via ORAL
  Filled 2017-02-16: qty 1

## 2017-02-16 MED ORDER — DEXTROSE 5 % IV SOLN
1.0000 g | INTRAVENOUS | Status: DC
  Administered 2017-02-16 – 2017-02-18 (×3): 1 g via INTRAVENOUS
  Filled 2017-02-16 (×3): qty 10

## 2017-02-16 MED ORDER — WARFARIN - PHYSICIAN DOSING INPATIENT
Freq: Every day | Status: DC
  Administered 2017-02-16: 18:00:00

## 2017-02-16 MED ORDER — METHYLPREDNISOLONE SODIUM SUCC 40 MG IJ SOLR
40.0000 mg | Freq: Two times a day (BID) | INTRAMUSCULAR | Status: DC
  Administered 2017-02-16 – 2017-02-19 (×7): 40 mg via INTRAVENOUS
  Filled 2017-02-16 (×7): qty 1

## 2017-02-16 MED ORDER — WARFARIN SODIUM 5 MG PO TABS: 5.0000 mg | ORAL_TABLET | ORAL | Status: DC

## 2017-02-16 MED ORDER — DEXTROMETHORPHAN POLISTIREX ER 30 MG/5ML PO SUER
15.0000 mg | Freq: Two times a day (BID) | ORAL | Status: DC | PRN
  Administered 2017-02-16 – 2017-02-18 (×2): 30 mg via ORAL
  Filled 2017-02-16 (×4): qty 10

## 2017-02-16 MED ORDER — METOPROLOL TARTRATE 25 MG PO TABS
25.0000 mg | ORAL_TABLET | Freq: Two times a day (BID) | ORAL | Status: DC
  Administered 2017-02-16 – 2017-02-17 (×3): 25 mg via ORAL
  Filled 2017-02-16 (×3): qty 1

## 2017-02-16 MED ORDER — ONDANSETRON HCL 4 MG PO TABS: 4.0000 mg | ORAL_TABLET | Freq: Four times a day (QID) | ORAL | Status: DC | PRN

## 2017-02-16 MED ORDER — AZITHROMYCIN 500 MG IV SOLR
500.0000 mg | INTRAVENOUS | Status: DC
  Administered 2017-02-16 – 2017-02-18 (×3): 500 mg via INTRAVENOUS
  Filled 2017-02-16 (×3): qty 500

## 2017-02-16 MED ORDER — IPRATROPIUM-ALBUTEROL 0.5-2.5 (3) MG/3ML IN SOLN
3.0000 mL | Freq: Three times a day (TID) | RESPIRATORY_TRACT | Status: DC
  Administered 2017-02-17 – 2017-02-19 (×7): 3 mL via RESPIRATORY_TRACT
  Filled 2017-02-16 (×7): qty 3

## 2017-02-16 MED ORDER — GI COCKTAIL ~~LOC~~
30.0000 mL | Freq: Three times a day (TID) | ORAL | Status: DC | PRN
  Administered 2017-02-16: 30 mL via ORAL
  Filled 2017-02-16: qty 30

## 2017-02-16 MED ORDER — WARFARIN SODIUM 2.5 MG PO TABS: 2.5000 mg | ORAL_TABLET | ORAL | Status: DC

## 2017-02-16 MED ORDER — GUAIFENESIN ER 600 MG PO TB12
600.0000 mg | ORAL_TABLET | Freq: Two times a day (BID) | ORAL | Status: DC | PRN
  Administered 2017-02-16: 1200 mg via ORAL
  Filled 2017-02-16: qty 2

## 2017-02-16 MED ORDER — WARFARIN SODIUM 2.5 MG PO TABS: 2.5000 mg | ORAL_TABLET | Freq: Every day | ORAL | Status: DC

## 2017-02-16 MED ORDER — MEMANTINE HCL 10 MG PO TABS
10.0000 mg | ORAL_TABLET | Freq: Two times a day (BID) | ORAL | Status: DC
  Administered 2017-02-17 – 2017-02-19 (×5): 10 mg via ORAL
  Filled 2017-02-16 (×5): qty 1

## 2017-02-16 MED ORDER — GUAIFENESIN ER 600 MG PO TB12
600.0000 mg | ORAL_TABLET | Freq: Two times a day (BID) | ORAL | Status: DC
  Administered 2017-02-16 – 2017-02-19 (×6): 600 mg via ORAL
  Filled 2017-02-16 (×6): qty 1

## 2017-02-16 MED ORDER — IPRATROPIUM-ALBUTEROL 0.5-2.5 (3) MG/3ML IN SOLN: 3.0000 mL | RESPIRATORY_TRACT | Status: DC | PRN

## 2017-02-16 MED ORDER — WARFARIN SODIUM 5 MG PO TABS
5.0000 mg | ORAL_TABLET | ORAL | Status: DC
  Administered 2017-02-17 – 2017-02-18 (×2): 5 mg via ORAL
  Filled 2017-02-16: qty 1

## 2017-02-16 MED ORDER — GABAPENTIN 300 MG PO CAPS
300.0000 mg | ORAL_CAPSULE | Freq: Three times a day (TID) | ORAL | Status: DC
  Administered 2017-02-16 – 2017-02-19 (×10): 300 mg via ORAL
  Filled 2017-02-16 (×10): qty 1

## 2017-02-16 MED ORDER — IPRATROPIUM-ALBUTEROL 0.5-2.5 (3) MG/3ML IN SOLN
3.0000 mL | Freq: Four times a day (QID) | RESPIRATORY_TRACT | Status: DC
  Administered 2017-02-16: 3 mL via RESPIRATORY_TRACT
  Filled 2017-02-16 (×2): qty 3

## 2017-02-16 MED ORDER — GUAIFENESIN-DM 100-10 MG/5ML PO SYRP
5.0000 mL | ORAL_SOLUTION | ORAL | Status: DC | PRN
  Administered 2017-02-17 (×3): 5 mL via ORAL
  Filled 2017-02-16 (×3): qty 5

## 2017-02-16 MED ORDER — PIPERACILLIN-TAZOBACTAM 3.375 G IVPB 30 MIN
3.3750 g | Freq: Once | INTRAVENOUS | Status: AC
  Administered 2017-02-16: 3.375 g via INTRAVENOUS
  Filled 2017-02-16: qty 50

## 2017-02-16 MED ORDER — HYDROCODONE-ACETAMINOPHEN 10-325 MG PO TABS
1.0000 | ORAL_TABLET | Freq: Four times a day (QID) | ORAL | Status: DC | PRN
  Administered 2017-02-16: 1 via ORAL
  Filled 2017-02-16: qty 1

## 2017-02-16 NOTE — Progress Notes (Signed)
Attempted to see pt. Pt vomiting and not feeling well.  Will attempt back as schedule allows. Tory Emerald, [place] [phone]

## 2017-02-16 NOTE — ED Notes (Signed)
Attempted to call report

## 2017-02-16 NOTE — H&P (Signed)
History and Physical  Patient Name: Logan Valdez.     JXB:[id number]    DOB: December 13, 1945    DOA: 02/15/2017 PCP: Eustaquio Boyden, MD   Patient coming from: Home  Chief Complaint: Confusion, weakness, cough, runny nose, fever,  HPI: Logan Valdez. is a 71 y.o. 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  The patient was in his usual state of health until yesterday when wife noticed some new chest congestion (their 45 yo grandson has been around a lot this week and has had "a terrible cold").  Then this morning, the patient had bad runny nose all day, worse chest congestion and cough, not improved with Mucinex.  Finally, this evening, she thought he was starting to be confused, talking "out of his head" and then at one point he couldn't get out of a chair he was so weak all over, so family thought maybe he was having a stroke and called 9-1-1.  EMS thought he had facial droop en route and so CODE STROKE was called.  ED course: -Temp 101.F, heart rate 83, respirations 22 and pulse ox normal, BP 158/62 -Na 138, K 4.4, Cr 1.02, WBC 11.6K, Hgb 13.2 -INR 2 -PTT 37 -Troponin negative -Lactic acid 1.56 --> 1.3 -CXR without focal opacity -UA without hematuria or pyuria -He was given vancomycin and Zosyn and TRH were asked to evaluate for early sepsis     ROS: Review of Systems  Constitutional: Positive for fever and malaise/fatigue.  HENT: Positive for congestion.   Respiratory: Positive for cough. Negative for hemoptysis, sputum production, shortness of breath and wheezing.   Gastrointestinal: Negative for abdominal pain, nausea and vomiting.  Genitourinary: Negative for dysuria, frequency and urgency.  Skin: Negative for rash.  Neurological: Positive for weakness. Negative for sensory change, speech change, focal weakness and loss of  consciousness.  Psychiatric/Behavioral: Positive for memory loss (chronic, plus confusion tonight).  All other systems reviewed and are negative.         Past Medical History:  Diagnosis Date  . Chronic lower back pain    [place]  . DDD (degenerative disc disease), lumbar   . Dysmetabolic syndrome X   . Gout, unspecified   . History of DVT (deep vein thrombosis)   . History of pulmonary embolism   . History of stroke 2009   [place]eth[place]  . History of thrombophlebitis   . HTN (hypertension)   . Mixed hyperlipidemia   . Obesity   . PFO (patent foramen ovale)   . Primary hypercoagulable state (HCC) 05/14/2008   Personal h/o DVT, PE, PFO, and lupus AC +   . Type II or unspecified type diabetes mellitus without mention of complication, uncontrolled   . Unspecified sleep apnea     Past Surgical History:  Procedure Laterality Date  . COLONOSCOPY  2000   [facility]  . WRIST SURGERY     Right wrist repair post trauma    Social History: Patient lives with his wife.  The patient walks unassisted at baseline. He rquires assistance with toileting and other ADLs because of dementia and back pain.  He is a never smoker.  From [employer], was a Psychologist, occupational.    Allergies  Allergen Reactions  . Hydrochlorothiazide Other (See Comments)    Mental changes   . Indomethacin     syncope  . Pravastatin Sodium  REACTION: rash 11/09  . Lisinopril     REACTION: HA  . Micardis [Telmisartan]     REACTION: HA    Family history: family history includes Cancer in his maternal aunt; Diabetes in his brother; Emphysema in his father; Heart attack (age of onset: 24) in his brother; Hypertension in his brother; Lung cancer in his brother; Skin cancer in his brother.  Prior to Admission medications   Medication Sig Start Date End Date Taking? Authorizing Provider  acetaminophen (TYLENOL) 500 MG tablet Take 500 mg by mouth 3 (three) times daily.   Yes Historical Provider, MD  atorvastatin (LIPITOR) 20 MG  tablet TAKE 1 TABLET BY MOUTH DAILY 06/12/16  Yes Eustaquio Boyden, MD  Cyanocobalamin (VITAMIN B-12 PO) Take 2 tablets by mouth 2 (two) times daily.   Yes Historical Provider, MD  dextromethorphan (DELSYM) 30 MG/5ML liquid Take 15-60 mg by mouth 2 (two) times daily as needed for cough.   Yes Historical Provider, MD  docusate sodium (COLACE) 100 MG capsule Take 100 mg by mouth daily as needed for mild constipation.   Yes Historical Provider, MD  finasteride (PROSCAR) 5 MG tablet TAKE 1 TABLET BY MOUTH DAILY 10/31/16  Yes Eustaquio Boyden, MD  FOLIC ACID PO Take 3 tablets by mouth 2 (two) times daily.   Yes Historical Provider, MD  gabapentin (NEURONTIN) 300 MG capsule TAKE ONE CAPSULE BY MOUTH THREE TIMES DAILY 10/04/16  Yes Eustaquio Boyden, MD  guaiFENesin (MUCINEX) 600 MG 12 hr tablet Take 600-1,200 mg by mouth 2 (two) times daily as needed for cough or to loosen phlegm.   Yes Historical Provider, MD  HYDROcodone-acetaminophen (NORCO) 10-325 MG per tablet Take 1 tablet by mouth every 6 (six) hours as needed for moderate pain.    Yes Historical Provider, MD  memantine (NAMENDA) 10 MG tablet Take 1 tablet (10 mg total) by mouth 2 (two) times daily. 07/14/16  Yes Nilda Riggs, NP  metoprolol tartrate (LOPRESSOR) 25 MG tablet TAKE 1 TABLET BY MOUTH TWICE A DAY 10/31/16  Yes Eustaquio Boyden, MD  polyethylene glycol powder (GLYCOLAX/MIRALAX) powder DISSOLVE ONE (1) CAPFUL (17GM) INTO 4 TO8 OUNCES OF FLUID AND TAKE BYMOUTH ONCE DAILY AS DIRECTED 06/29/16  Yes Eustaquio Boyden, MD  Pyridoxine HCl (VITAMIN B-6 PO) Take 1 tablet by mouth 2 (two) times daily.   Yes Historical Provider, MD  triamcinolone cream (KENALOG) 0.1 % Apply 1 application topically 2 (two) times daily. Apply to AA. Patient taking differently: Apply 1 application topically 2 (two) times daily as needed (rash). Apply to AA. 10/15/15  Yes Eustaquio Boyden, MD  warfarin (COUMADIN) 5 MG tablet TAKE AS DIRECTED BY COUMADIN CLINIC Patient  taking differently: Take 2.5 mg on Wednesday and Friday, all other days take 5 mg. 10/31/16  Yes Eustaquio Boyden, MD       Physical Exam: BP (!) 159/70 (BP Location: Right Arm)   Pulse 74   Temp 99.4 F (37.4 C) (Oral)   Resp 13   Ht  (1.753 m)   Wt 120.6 kg (265 lb 14 oz)   SpO2 96%   BMI 39.26 kg/m  General appearance: Elderly obese adult male, awake but confused, sweat, in no obvious distress.   Eyes: Anicteric, conjunctiva pink, lids and lashes normal. PERRL.    ENT: No nasal deformity, epistaxis, clear discharged and swollen turbinates.  Hearing slightly diminished. OP moist without lesions.   Neck: No neck masses.  Trachea midline.  No thyromegaly/tenderness. Lymph: No cervical or supraclavicular lymphadenopathy.  Skin: Warm and sweaty.  No jaundice.  No suspicious rashes or lesions. Cardiac: RRR, nl S1-S2, no murmurs appreciated.  Capillary refill is brisk.  JVP not visible.  No LE edema.  Radial and DP pulses 2+ and symmetric. Respiratory: Normal respiratory rate and rhythm.  CTAB without rales or wheezes. Abdomen: Abdomen soft.  No TTP. No ascites, distension, hepatosplenomegaly.   MSK: No deformities or effusions.  No cyanosis or clubbing. Neuro: Cranial nerves normal.  Sensation intact to light touch. Speech is fluent.  Muscle strength normal, perhaps globally weak.    Psych: Sensorium intact and responding to questions, attention not appropriate to situation.  Behavior appropriate.  Affect confused.  Judgment and insight appear poor.     Labs on Admission:  I have personally reviewed following labs and imaging studies: CBC:  Recent Labs Lab 02/15/17 2315 02/15/17 2325  WBC 11.6*  --   NEUTROABS 9.6*  --   HGB 13.2 14.6  HCT 40.9 43.0  MCV 91.7  --   PLT 137*  --    Basic Metabolic Panel:  Recent Labs Lab 02/15/17 2315 02/15/17 2325  NA 138 137  K 4.4 4.2  CL 99* 103  CO2 22  --   GLUCOSE 128* 132*  BUN 13 14  CREATININE 1.02 0.90  CALCIUM  9.0  --    GFR: Estimated Creatinine Clearance: 96.6 mL/min (by C-G formula based on SCr of 0.9 mg/dL).  Liver Function Tests:  Recent Labs Lab 02/15/17 2315  AST 30  ALT 21  ALKPHOS 60  BILITOT 1.6*  PROT 6.8  ALBUMIN 3.9   No results for input(s): LIPASE, AMYLASE in the last 168 hours. No results for input(s): AMMONIA in the last 168 hours. Coagulation Profile:  Recent Labs Lab 02/15/17 2315  INR 1.98   Cardiac Enzymes: No results for input(s): CKTOTAL, CKMB, CKMBINDEX, TROPONINI in the last 168 hours. BNP (last 3 results) No results for input(s): PROBNP in the last 8760 hours. HbA1C: No results for input(s): HGBA1C in the last 72 hours. CBG:  Recent Labs Lab 02/15/17 2319  GLUCAP 134*   Lipid Profile: No results for input(s): CHOL, HDL, LDLCALC, TRIG, CHOLHDL, LDLDIRECT in the last 72 hours. Thyroid Function Tests: No results for input(s): TSH, T4TOTAL, FREET4, T3FREE, THYROIDAB in the last 72 hours. Anemia Panel: No results for input(s): VITAMINB12, FOLATE, FERRITIN, TIBC, IRON, RETICCTPCT in the last 72 hours. Sepsis Labs: Lactate normal Invalid input(s): PROCALCITONIN, LACTICIDVEN No results found for this or any previous visit (from the past 240 hour(s)).       Radiological Exams on Admission: Personally reviewed CXR shows no focal opacity; CT head report reviewed: Dg Chest Portable 1 View  Result Date: 02/16/2017 CLINICAL DATA:  Difficulty standing and ambulating.  Fever. EXAM: PORTABLE CHEST 1 VIEW COMPARISON:  01/09/2016 CXR FINDINGS: Low lung volumes. Borderline cardiomegaly. Aortic atherosclerosis. No pneumonic consolidation, CHF nor effusion. No acute osseous appearing abnormality. IMPRESSION: No active disease.  Aortic atherosclerosis. Electronically Signed   By: Tollie Eth M.D.   On: 02/16/2017 00:15   Ct Head Code Stroke W/o Cm  Result Date: 02/15/2017 CLINICAL DATA:  Code stroke. Initial evaluation for acute left arm weakness. EXAM: CT HEAD  WITHOUT CONTRAST TECHNIQUE: Contiguous axial images were obtained from the base of the skull through the vertex without intravenous contrast. COMPARISON:  Prior CT from 08/31/2015. FINDINGS: Brain: Generalized cerebral atrophy with mild chronic small vessel ischemic disease. Encephalomalacia within the left occipital region compatible with remote left  PCA territory infarct. No acute intracranial hemorrhage. No evidence for acute large vessel territory infarct. No mass lesion, midline shift or mass effect. No hydrocephalus. No extra-axial fluid collection. Vascular: No hyperdense vessel. Scattered vascular calcifications noted within the carotid siphons. Skull: Scalp soft tissues within normal limits.  Calvarium intact. Sinuses/Orbits: Visualized globes and orbital soft tissues within normal limits. Scattered mucosal thickening within the ethmoidal air cells. Paranasal sinuses otherwise clear. No mastoid effusion. ASPECTS [facility] Stroke Program Early CT Score) - Ganglionic level infarction (caudate, lentiform nuclei, internal capsule, insula, M1-M3 cortex): 7 - Supraganglionic infarction (M4-M6 cortex): 3 Total score (0-10 with 10 being normal): 10 IMPRESSION: 1. No acute intracranial infarct or other process identified. 2. ASPECTS is 10 3. Stable atrophy with chronic microvascular ischemic disease and remote left PCA territory infarct. Critical Value/emergent results were called by telephone at the time of interpretation on 02/15/2017 at 11:53 pm to Dr. Amada Jupiter, who verbally acknowledged these results. Electronically Signed   By: Rise Mu M.D.   On: 02/15/2017 23:56    EKG: Independently reviewed. Rate 82, QTc 447, normal sinus rhythm.    Assessment/Plan  1. Altered mental status:  Suspect this is from fever in setting of dementia.  Stroke doubted.  CNS infection doubted.  Normal electrolytes and no reported ingestions. -PT/OT -CM consult for HH   2. Fever:  Doubt serious bacterial  infection, although this was concern of ER.  Does not meet sepsis criteria.  Symptoms are typical for URI. -Defer further antibiotics for now. -Follow cultures -Check RVP and procalcitonin  3. Chronic back pain:  -Continue Norco PRN -Continue gabapentin  4. Hypertension:  -Continue metoprolol  5. History of stroke, history of hypercoagulable state:  -Continue warfarin  6. Other medications:  -Restart proscar, namenda, statin if kept longer than 24 hours      DVT prophylaxis: N/A on warfarin  Code Status: FULL  Family Communication: Wife and daughter at bedside  Disposition Plan: Anticipate check RVP and procalcitonin and monitor fever curve.  If no clinical wosrening to suggest serious bacterial infection, will get PT OT tomrrow and likely home Friday evening. Consults called: None Admission status: OBS At the point of initial evaluation, it is my clinical opinion that admission for OBSERVATION is reasonable and necessary because the patient's presenting complaints in the context of their chronic conditions represent sufficient risk of deterioration or significant morbidity to constitute reasonable grounds for close observation in the hospital setting, but that the patient may be medically stable for discharge from the hospital within 24 to 48 hours.    Medical decision making: Patient seen at 2:30 AM on 02/16/2017.  The patient was discussed with Dr. Nicanor Alcon.  What exists of the patient's chart was reviewed in depth sand summarized above.  Clinical condition: stable.        Alberteen Sam [facility] Pager [phone]

## 2017-02-16 NOTE — Progress Notes (Signed)
PT Cancellation Note  Patient Details Name: Logan Valdez. MRN: [id number] DOB: Sep 23, 1946   Cancelled Treatment:    Reason Eval/Treat Not Completed: Medical issues which prohibited therapy (nausea/vomiting this AM, request by family to wait till PM.)  Will re-attempt later.   Freida Busman, Ayriana Wix L 02/16/2017, 12:46 PM

## 2017-02-16 NOTE — Progress Notes (Signed)
Physical Therapy Evaluation Patient Details Name: Nameer Summer. MRN: [id number] DOB: 1946-05-28 Today's Date: 02/16/2017   History of Present Illness  71 y.o. 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  Increased confusion and weakness.  Clinical Impression  Patient seen with family present, by report has 24 hour assist at home due to dementia.  Patient strength vs reports in chart and family are significantly improved, still remains as high fall risk and needs MOD assist overall for functional mobility as well as increased cues and time.  Patient will benefit from continuation of skilled PT services while in hospital, and will be maintained on roster, recommending Home Health PT to follow for transition care when medically appropriate to transition.    Follow Up Recommendations Home health PT;Supervision/Assistance - 24 hour    Equipment Recommendations  None recommended by PT    Recommendations for Other Services       Precautions / Restrictions Precautions Precautions: Fall Restrictions Weight Bearing Restrictions: No      Mobility  Bed Mobility Overal bed mobility: Needs Assistance Bed Mobility: Supine to Sit     Supine to sit: Mod assist     General bed mobility comments: HOB elevated, increased cues and time.  Transfers Overall transfer level: Needs assistance Equipment used: 1 person hand held assist Transfers: Sit to/from [employer] Sit to Stand: Mod assist Stand pivot transfers: Min assist       General transfer comment: Increased time and cues due to dementia / attention.  Ambulation/Gait Ambulation/Gait assistance: Min assist;Min guard Ambulation Distance (Feet): 25 Feet Assistive device: 1 person hand held assist Gait Pattern/deviations: Shuffle;Decreased weight shift to right;Decreased  weight shift to left   Gait velocity interpretation: Below normal speed for age/gender General Gait Details: Frequent verbal and tactile cues  Stairs            Wheelchair Mobility    Modified Rankin (Stroke Patients Only)       Balance Overall balance assessment: Needs assistance Sitting-balance support: Feet supported Sitting balance-Leahy Scale: Fair     Standing balance support: No upper extremity supported Standing balance-Leahy Scale: Fair                               Pertinent Vitals/Pain Pain Assessment: No/denies pain (History of chronic low back pain.)    Home Living Family/patient expects to be discharged to:: Private residence Living Arrangements: Spouse/significant other Available Help at Discharge: Family;Available 24 hours/day Type of Home: House Home Access: Stairs to enter   [employer] of Steps: 1 Home Layout: One level        Prior Function Level of Independence: Independent         Comments: Assist of family at times, patient with dementia, refuses assistive devices.     Hand Dominance        Extremity/Trunk Assessment   Upper Extremity Assessment Upper Extremity Assessment: Overall WFL for tasks assessed;Defer to OT evaluation    Lower Extremity Assessment Lower Extremity Assessment: Generalized weakness;Overall [facility] for tasks assessed       Communication   Communication: Receptive difficulties;Expressive difficulties (History of vascular dementia at baseline)  Cognition Arousal/Alertness: Awake/alert;Lethargic (Just waking up on arrival.) Behavior During Therapy: Flat affect;WFL for tasks assessed/performed Overall Cognitive Status: Impaired/Different from baseline (Improved since yesterday, per family.) Area  of Impairment: Orientation;Attention;Memory;Following commands;Awareness;Problem solving;Safety/judgement                 Orientation Level: Place;Time;Situation Current Attention  Level: Focused;Alternating Memory: Decreased short-term memory Following Commands: Follows one step commands inconsistently;Follows one step commands with increased time Safety/Judgement: Decreased awareness of safety;Decreased awareness of deficits   Problem Solving: Slow processing;Decreased initiation;Requires verbal cues;Requires tactile cues        General Comments      Exercises     Assessment/Plan    PT Assessment Patient needs continued PT services  PT Problem List Decreased strength;Decreased activity tolerance;Decreased mobility;Decreased safety awareness;Obesity       PT Treatment Interventions Gait training;Functional mobility training;Therapeutic activities;Therapeutic exercise;Patient/family education    PT Goals (Current goals can be found in the Care Plan section)  Acute Rehab PT Goals Patient Stated Goal: Not stated, per family to get home again. PT Goal Formulation: With patient/family Time For Goal Achievement: 03/02/17 Potential to Achieve Goals: Good    Frequency Min 3X/week   Barriers to discharge        Co-evaluation               End of Session Equipment Utilized During Treatment: Gait belt Activity Tolerance: Patient tolerated treatment well Patient left: in chair;with call bell/phone within reach;with family/visitor present Nurse Communication: Mobility status PT Visit Diagnosis: Muscle weakness (generalized) (M62.81)    Time: 0981-1914 PT Time Calculation (min) (ACUTE ONLY): 39 min   Charges:   PT Evaluation $PT Eval Moderate Complexity: 1 Procedure PT Treatments $Therapeutic Activity: 8-22 mins   PT G Codes:   PT G-Codes **NOT FOR INPATIENT CLASS** Functional Assessment Tool Used: AM-PAC 6 Clicks Basic Mobility Functional Limitation: Mobility: Walking and moving around Mobility: Walking and Moving Around Current Status (N8295): At least 40 percent but less than 60 percent impaired, limited or restricted Mobility: Walking  and Moving Around Goal Status [phone]): At least 20 percent but less than 40 percent impaired, limited or restricted    Janan Halter, DPT  Freida Busman, Brenin Heidelberger L 02/16/2017, 3:16 PM

## 2017-02-16 NOTE — Progress Notes (Signed)
CODE STROKE called at 2250.  Per EMS report patient was last seen normal at 1700.  Per family, they noticed that patient was having difficulty standing up, ambulating, left sided facial droop, and altered mental status. Patient does have a history of a prior CVA (left occipital lobe).   Patient arrived at 2315  EMS stated his BP initially for them was a SBP > 200 but it had improved upon arrival to ED.  Patient was cleared by EDP and taken to CT, CT was negative.  NIH of 3 was given, 2 for LOC Questions and 1 for Aphasia.  Code stroke was cancelled at 2340.   Per EMS patient was febrile for them and rectal temp in ED was 101.4.    Time arrived: 2310 End Time: 2345

## 2017-02-16 NOTE — Consult Note (Signed)
Neurology Consultation Reason for Consult: Confusion Referring Physician: Palumbo, A  CC: Confusion  History is obtained from: Patient  HPI: Logan Valdez. is a 71 y.o. male with a history of dementia, baseline Mini-Mental status exam was 16/30, who was in his normal state of health until around 5 PM. He then had progressively worsening confusion, difficulty standing. EMS was called because of this and in transit they felt like he may have had some left facial droop and therefore activated a code stroke. In the emergency department, he was noticed to have a fever.  He denies headache, denies pain.   LKW: 5 PM tpa given?: no, out of window    ROS:  Unable to obtain due to altered mental status.   Past Medical History:  Diagnosis Date  . Chronic lower back pain    [place]  . DDD (degenerative disc disease), lumbar   . Dysmetabolic syndrome X   . Gout, unspecified   . History of DVT (deep vein thrombosis)   . History of pulmonary embolism   . History of stroke 2009   [place]  . History of thrombophlebitis   . HTN (hypertension)   . Mixed hyperlipidemia   . Obesity   . PFO (patent foramen ovale)   . Primary hypercoagulable state (HCC) 05/14/2008   Personal h/o DVT, PE, PFO, and lupus AC +   . Type II or unspecified type diabetes mellitus without mention of complication, uncontrolled   . Unspecified sleep apnea      Family History  Problem Relation Age of Onset  . Emphysema Father   . Diabetes Brother   . Hypertension Brother   . Heart attack Brother 43  . Skin cancer Brother   . Lung cancer Brother   . Cancer Maternal Aunt     ? Type   . Stroke Neg Hx      Social History:  reports that he has never smoked. He has never used smokeless tobacco. He reports that he does not drink alcohol or use drugs.   Exam: Current vital signs: BP (!) 152/80   Pulse 73   Temp 99.4 F (37.4 C) (Oral)   Resp (!) 22   Ht  (1.753 m)   Wt 120.6 kg (265 lb 14 oz)   SpO2  95%   BMI 39.26 kg/m  Vital signs in last 24 hours: Temp:  [99.4 F (37.4 C)-101.4 F (38.6 C)] 99.4 F (37.4 C) (04/13 0215) Pulse Rate:  [68-83] 73 (04/13 0215) Resp:  [12-22] 22 (04/13 0215) BP: (141-158)/(62-99) 152/80 (04/13 0215) SpO2:  [92 %-97 %] 95 % (04/13 0215) Weight:  [120.6 kg (265 lb 14 oz)] 120.6 kg (265 lb 14 oz) (04/12 2337)   Physical Exam  Constitutional: Appears well-developed and well-nourished.  Psych: Affect appropriate to situation Eyes: No scleral injection HENT: No OP obstrucion Head: Normocephalic.  Cardiovascular: Normal rate and regular rhythm.  Respiratory: Effort normal and breath sounds normal to anterior ascultation GI: Soft.  No distension. There is no tenderness.  Skin: WDI  Neuro: Mental Status: He has a significant aphasia, but is able to communicate and answer questions and follow commands. He is not oriented. Cranial Nerves: II: Some difficulty with sitting on the right, but does appear to have some vision on that side. Pupils are equal, round, and reactive to light.   III,IV, VI: EOMI without ptosis or diploplia.  V: Facial sensation is symmetric to temperature VII: Facial movement is symmetric.  VIII: hearing  is intact to voice X: Uvula elevates symmetrically XI: Shoulder shrug is symmetric. XII: tongue is midline without atrophy or fasciculations.  Motor: Tone is normal. Bulk is normal. 5/5 strength was present in all four extremities.  Sensory: Sensation is symmetric to light touch and temperature in the arms and legs. Cerebellar: FNF and HKS are intact bilaterally  His neck is supple  I have reviewed labs in epic and the results pertinent to this consultation are: Chem 8 unremarkable CBC-elevated white count  I have reviewed the images obtained: CT head-no acute findings, left occipital infarct  Impression: 71 year old male with altered mental status in the setting of fever and underlying dementia. With an MMSE of 16/30  at baseline, I think that any inflammatory state would've the ability to make him quite confused. I don't have any reason to suspect CNS infection at this time.  I would not favor further workup unless he continues to persistently be altered after treatment.  Recommendations: 1) if he continues to be altered, could consider MRI of the brain 2) no further recommendations at this time, please call if any further questions or concerns remain.   Ritta Slot, MD [facility] [phone]  If 7pm- 7am, please page neurology on call as listed in AMION.

## 2017-02-16 NOTE — Progress Notes (Signed)
Called [phone] for report on patient from [place]. Awaiting call back

## 2017-02-16 NOTE — ED Notes (Addendum)
Admitting Provider at bedside. 

## 2017-02-16 NOTE — Progress Notes (Signed)
Patient seen and examined at bedside. Patient admitted after midnight, please see earlier admission note by Dr. Maryfrances Bunnell. Patient admitted for evaluation of acute encephalopathy and dyspnea. This was thought to be secondary to viral URI. Patient however, worse this morning with Tmax 101.4 F, more wheezing on exam with mild tachypnea, respiratory rate 22. I will ask for CT chest without contrast for clear evaluation. Place on Zithromax and Rocephin IV for now. Follow-up on respiratory virus panel. Obtain sputum culture. Also placed on steroids and narrow down as clinically indicated, allow bronchodilators scheduled and as needed.  Debbora Presto, MD  [facility] Pager [phone]  If 7PM-7AM, please contact night-coverage [email or web address] Password [email or web address]

## 2017-02-17 LAB — CBC
HCT: 40.8 % (ref 39.0–52.0)
HEMOGLOBIN: 13.3 g/dL (ref 13.0–17.0)
MCH: 30 pg (ref 26.0–34.0)
MCHC: 32.6 g/dL (ref 30.0–36.0)
MCV: 91.9 fL (ref 78.0–100.0)
Platelets: 155 10*3/uL (ref 150–400)
RBC: 4.44 MIL/uL (ref 4.22–5.81)
RDW: 13.4 % (ref 11.5–15.5)
WBC: 9.3 10*3/uL (ref 4.0–10.5)

## 2017-02-17 LAB — BASIC METABOLIC PANEL
Anion gap: 9 (ref 5–15)
BUN: 13 mg/dL (ref 6–20)
CHLORIDE: 105 mmol/L (ref 101–111)
CO2: 25 mmol/L (ref 22–32)
CREATININE: 0.98 mg/dL (ref 0.61–1.24)
Calcium: 8.9 mg/dL (ref 8.9–10.3)
GFR calc non Af Amer: >60 mL/min (ref >60)
Glucose, Bld: 168 mg/dL — ABNORMAL HIGH (ref 65–99)
Potassium: 4.2 mmol/L (ref 3.5–5.1)
SODIUM: 139 mmol/L (ref 135–145)

## 2017-02-17 LAB — PROTIME-INR
INR: 1.54
PROTHROMBIN TIME: 18.6 s — AB (ref 11.4–15.2)

## 2017-02-17 MED ORDER — METOPROLOL TARTRATE 12.5 MG HALF TABLET
12.5000 mg | ORAL_TABLET | Freq: Two times a day (BID) | ORAL | Status: DC
  Administered 2017-02-17 – 2017-02-19 (×4): 12.5 mg via ORAL
  Filled 2017-02-17 (×4): qty 1

## 2017-02-17 NOTE — Progress Notes (Signed)
Patient ID: Logan Flank., male   DOB: [birth year], 71 y.o.   MRN: [id number]    PROGRESS NOTE    Logan Flank.  WUJ:[id number] DOB: 09/29/1946 DOA: 02/15/2017  PCP: Eustaquio Boyden, MD   Brief Narrative:   Patient admitted for evaluation of acute encephalopathy and dyspnea. This was thought to be secondary to viral URI and no ABX regimen initiated. Patient however, worse on AM 4/13 with Tmax 101.4 F, more wheezing on exam with mild tachypnea, respiratory rate 22. Empiric ABX started with solumedrol and recommended inpatient status for continued treatment.   Assessment & Plan:  Fever, leukocytosis, SIRS secondary to rhinoviral infection - due to concern of superimposed bacterial infection, empiric ABX started - pt reports feeling better but still with diffuse wheezing and rhonchi, non productive cough - still with low grade fever 99.6 F in the past 24 hours, WBC is now WNL - continue empiric ABX until we have more results back like sputum culture - today is day #2 of Zithromax and rocephin   ? COPD in the setting of rhinovirus - keep on solumedrol and BD's scheduled and as needed - keep on oxygen via [place] for now    Acute metabolic encephalopathy - secondary to the above - improving and suspect near baseline  - PT/OT eval   Bradycardia - mild, keep monitoring and hold metoprolol if HF < 55  Chronic back pain - Continue Norco PRN - Continue gabapentin  Hypertension - Continue metoprolol  History of stroke, history of hypercoagulable state - Continue warfarin  DVT prophylaxis: coumadin  Code Status: full  Family Communication: Patient and wife at bedside  Disposition Plan: home in 1-2 days   Consultants:   None  Procedures:   None  Antimicrobials:   Zithromax 4/13 -->  Rocephin 4/13 -->    Subjective: Pt reports feeling better   Objective: Vitals:   02/16/17 2055 02/16/17 2104 02/17/17 0555 02/17/17 0805  BP:  130/67 (!) 144/59     Pulse:  69 (!) 55   Resp:  18 18   Temp:  98.9 F (37.2 C) 98 F (36.7 C)   TempSrc:      SpO2: 94% 97% 94% 95%  Weight:      Height:        Intake/Output Summary (Last 24 hours) at 02/17/17 1227 Last data filed at 02/17/17 0155  Gross per 24 hour  Intake               20 ml  Output              830 ml  Net             -810 ml   Filed Weights   02/15/17 2337  Weight: 120.6 kg (265 lb 14 oz)    Examination:  General exam: Appears calm and comfortable  Respiratory system: Course breath sounds bilaterally with exp wheezing, rhonchi at bases  Cardiovascular system: S1 & S2 heard, RRR. No JVD, murmurs, rubs, gallops or clicks. No pedal edema. Gastrointestinal system: Abdomen is nondistended, soft and nontender. No organomegaly or masses felt. Normal bowel sounds heard. Central nervous system: Alert and oriented. No focal neurological deficits. Extremities: Symmetric 5 x 5 power.  Data Reviewed: I have personally reviewed following labs and imaging studies  CBC:  Recent Labs Lab 02/15/17 2315 02/15/17 2325 02/17/17 0535  WBC 11.6*  --  9.3  NEUTROABS 9.6*  --   --   HGB 13.2  14.6 13.3  HCT 40.9 43.0 40.8  MCV 91.7  --  91.9  PLT 137*  --  155   Basic Metabolic Panel:  Recent Labs Lab 02/15/17 2315 02/15/17 2325 02/17/17 0535  NA 138 137 139  K 4.4 4.2 4.2  CL 99* 103 105  CO2 22  --  25  GLUCOSE 128* 132* 168*  BUN CREATININE 1.02 0.90 0.98  CALCIUM 9.0  --  8.9   Liver Function Tests:  Recent Labs Lab 02/15/17 2315  AST 30  ALT 21  ALKPHOS 60  BILITOT 1.6*  PROT 6.8  ALBUMIN 3.9   Coagulation Profile:  Recent Labs Lab 02/15/17 2315 02/17/17 0535  INR 1.98 1.54   CBG:  Recent Labs Lab 02/15/17 2319  GLUCAP 134*   Urine analysis:    Component Value Date/Time   COLORURINE YELLOW 02/16/2017 0042   APPEARANCEUR CLEAR 02/16/2017 0042   LABSPEC 1.023 02/16/2017 0042   PHURINE 6.0 02/16/2017 0042   GLUCOSEU NEGATIVE  02/16/2017 0042   HGBUR NEGATIVE 02/16/2017 0042   BILIRUBINUR NEGATIVE 02/16/2017 0042   BILIRUBINUR negative 05/06/2016 0955   KETONESUR 5 (A) 02/16/2017 0042   PROTEINUR NEGATIVE 02/16/2017 0042   UROBILINOGEN 1.0 05/06/2016 0955   UROBILINOGEN 0.2 08/31/2015 1405   NITRITE NEGATIVE 02/16/2017 0042   LEUKOCYTESUR NEGATIVE 02/16/2017 0042   Recent Results (from the past 240 hour(s))  Blood Culture (routine x 2)     Status: None (Preliminary result)   Collection Time: 02/15/17 11:40 PM  Result Value Ref Range Status   Specimen Description BLOOD RIGHT HAND  Final   Special Requests   Final    BOTTLES DRAWN AEROBIC AND ANAEROBIC Blood Culture adequate volume   Culture NO GROWTH 1 DAY  Final   Report Status PENDING  Incomplete  Blood Culture (routine x 2)     Status: None (Preliminary result)   Collection Time: 02/15/17 11:50 PM  Result Value Ref Range Status   Specimen Description BLOOD LEFT HAND  Final   Special Requests   Final    BOTTLES DRAWN AEROBIC AND ANAEROBIC Blood Culture adequate volume   Culture NO GROWTH 1 DAY  Final   Report Status PENDING  Incomplete  Respiratory Panel by PCR     Status: Abnormal   Collection Time: 02/16/17  3:45 AM  Result Value Ref Range Status   Adenovirus NOT DETECTED NOT DETECTED Final   Coronavirus 229E NOT DETECTED NOT DETECTED Final   Coronavirus HKU1 NOT DETECTED NOT DETECTED Final   Coronavirus NL63 NOT DETECTED NOT DETECTED Final   Coronavirus OC43 NOT DETECTED NOT DETECTED Final   Metapneumovirus NOT DETECTED NOT DETECTED Final   Rhinovirus / Enterovirus DETECTED (A) NOT DETECTED Final   Influenza A NOT DETECTED NOT DETECTED Final   Influenza B NOT DETECTED NOT DETECTED Final   Parainfluenza Virus 1 NOT DETECTED NOT DETECTED Final   Parainfluenza Virus 2 NOT DETECTED NOT DETECTED Final   Parainfluenza Virus 3 NOT DETECTED NOT DETECTED Final   Parainfluenza Virus 4 NOT DETECTED NOT DETECTED Final   Respiratory Syncytial Virus NOT  DETECTED NOT DETECTED Final   Bordetella pertussis NOT DETECTED NOT DETECTED Final   Chlamydophila pneumoniae NOT DETECTED NOT DETECTED Final   Mycoplasma pneumoniae NOT DETECTED NOT DETECTED Final    Radiology Studies: Ct Chest Wo Contrast  Result Date: 02/16/2017 CLINICAL DATA:  Dyspnea EXAM: CT CHEST WITHOUT CONTRAST TECHNIQUE: Multidetector CT imaging of the chest was performed following the  standard protocol without IV contrast. COMPARISON:  Chest x-ray 02/16/2017 FINDINGS: Cardiovascular: Heart size within normal limits with prominent epicardial fat. No pericardial effusion. Mild coronary calcification. Aortic valve calcification. Pulmonary arteries normal in caliber. Mediastinum/Nodes: Prominent mediastinal fat.  No mass or adenopathy Lungs/Pleura: Negative for infiltrate or effusion. No pulmonary mass or nodule. Mild apical pleural plaque bilaterally with mild amount of calcification in the plaque. No mass lesion Upper Abdomen: Negative Musculoskeletal: No acute skeletal abnormality IMPRESSION: Lungs are clear without infiltrate effusion or mass. Mild calcified pleural plaques bilaterally.  No effusion Mild coronary calcification. Electronically Signed   By: Marlan Palau M.D.   On: 02/16/2017 12:09   Dg Chest Portable 1 View  Result Date: 02/16/2017 CLINICAL DATA:  Difficulty standing and ambulating.  Fever. EXAM: PORTABLE CHEST 1 VIEW COMPARISON:  01/09/2016 CXR FINDINGS: Low lung volumes. Borderline cardiomegaly. Aortic atherosclerosis. No pneumonic consolidation, CHF nor effusion. No acute osseous appearing abnormality. IMPRESSION: No active disease.  Aortic atherosclerosis. Electronically Signed   By: Tollie Eth M.D.   On: 02/16/2017 00:15   Ct Head Code Stroke W/o Cm  Result Date: 02/15/2017 CLINICAL DATA:  Code stroke. Initial evaluation for acute left arm weakness. EXAM: CT HEAD WITHOUT CONTRAST TECHNIQUE: Contiguous axial images were obtained from the base of the skull through the  vertex without intravenous contrast. COMPARISON:  Prior CT from 08/31/2015. FINDINGS: Brain: Generalized cerebral atrophy with mild chronic small vessel ischemic disease. Encephalomalacia within the left occipital region compatible with remote left PCA territory infarct. No acute intracranial hemorrhage. No evidence for acute large vessel territory infarct. No mass lesion, midline shift or mass effect. No hydrocephalus. No extra-axial fluid collection. Vascular: No hyperdense vessel. Scattered vascular calcifications noted within the carotid siphons. Skull: Scalp soft tissues within normal limits.  Calvarium intact. Sinuses/Orbits: Visualized globes and orbital soft tissues within normal limits. Scattered mucosal thickening within the ethmoidal air cells. Paranasal sinuses otherwise clear. No mastoid effusion. ASPECTS [facility] Stroke Program Early CT Score) - Ganglionic level infarction (caudate, lentiform nuclei, internal capsule, insula, M1-M3 cortex): 7 - Supraganglionic infarction (M4-M6 cortex): 3 Total score (0-10 with 10 being normal): 10 IMPRESSION: 1. No acute intracranial infarct or other process identified. 2. ASPECTS is 10 3. Stable atrophy with chronic microvascular ischemic disease and remote left PCA territory infarct. Critical Value/emergent results were called by telephone at the time of interpretation on 02/15/2017 at 11:53 pm to Dr. Amada Jupiter, who verbally acknowledged these results. Electronically Signed   By: Rise Mu M.D.   On: 02/15/2017 23:56    Scheduled Meds: . atorvastatin  20 mg Oral Daily  . azithromycin  500 mg Intravenous Q24H  . cefTRIAXone (ROCEPHIN)  IV  1 g Intravenous Q24H  . gabapentin  300 mg Oral TID  . guaiFENesin  600 mg Oral BID  . ipratropium-albuterol  3 mL Nebulization TID  . memantine  10 mg Oral BID  . methylPREDNISolone (SOLU-MEDROL) injection  40 mg Intravenous Q12H  . metoprolol tartrate  25 mg Oral BID  . pantoprazole  40 mg Oral Daily  .  warfarin  5 mg Oral Once per day on Sun Mon Tue Thu Sat   And  . warfarin  2.5 mg Oral Once per day on Wed Fri  . Warfarin - Physician Dosing Inpatient   Does not apply q1800   Continuous Infusions:   LOS: 1 day   Time spent: 20 minutes   Debbora Presto, MD [facility] Pager [phone]  If 7PM-7AM, please contact  night-coverage [email or web address] Password [facility] 02/17/2017, 12:27 PM

## 2017-02-18 LAB — CBC
HCT: 41.8 % (ref 39.0–52.0)
HEMOGLOBIN: 13.7 g/dL (ref 13.0–17.0)
MCH: 30.1 pg (ref 26.0–34.0)
MCHC: 32.8 g/dL (ref 30.0–36.0)
MCV: 91.9 fL (ref 78.0–100.0)
Platelets: 190 10*3/uL (ref 150–400)
RBC: 4.55 MIL/uL (ref 4.22–5.81)
RDW: 13.3 % (ref 11.5–15.5)
WBC: 10.9 10*3/uL — AB (ref 4.0–10.5)

## 2017-02-18 LAB — BASIC METABOLIC PANEL
ANION GAP: 11 (ref 5–15)
BUN: 15 mg/dL (ref 6–20)
CALCIUM: 9 mg/dL (ref 8.9–10.3)
CO2: 23 mmol/L (ref 22–32)
CREATININE: 1.05 mg/dL (ref 0.61–1.24)
Chloride: 103 mmol/L (ref 101–111)
GFR calc non Af Amer: >60 mL/min (ref >60)
Glucose, Bld: 179 mg/dL — ABNORMAL HIGH (ref 65–99)
Potassium: 4 mmol/L (ref 3.5–5.1)
SODIUM: 137 mmol/L (ref 135–145)

## 2017-02-18 LAB — PROCALCITONIN: Procalcitonin: <0.1 ng/mL

## 2017-02-18 LAB — PROTIME-INR
INR: 1.5
PROTHROMBIN TIME: 18.2 s — AB (ref 11.4–15.2)

## 2017-02-18 MED ORDER — DOXYCYCLINE HYCLATE 100 MG PO TABS
100.0000 mg | ORAL_TABLET | Freq: Two times a day (BID) | ORAL | Status: DC
  Administered 2017-02-18 – 2017-02-19 (×3): 100 mg via ORAL
  Filled 2017-02-18 (×3): qty 1

## 2017-02-18 NOTE — Evaluation (Signed)
Occupational Therapy Evaluation Patient Details Name: Logan Valdez. MRN: [id number] DOB: 12/23/1945 Today's Date: 02/18/2017    History of Present Illness 71 y.o. 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  Increased confusion and weakness.   Clinical Impression   Pt. Has decreased I and safety with ADLs and mobility. Pt. Has decreased cognition and safety. Pt. Has had assist at home to assist with ADLs and transfers. The level of assist varried depending on the day and pt. Cognition.     Follow Up Recommendations  Home health OT    Equipment Recommendations  None recommended by OT    Recommendations for Other Services       Precautions / Restrictions Precautions Precautions: Fall Restrictions Weight Bearing Restrictions: No      Mobility Bed Mobility         Supine to sit: HOB elevated;Min guard     General bed mobility comments: +rail, increased time to complete. No physical assist needed. Min guard for safety.  Transfers   Equipment used: Ambulation equipment used   Sit to Stand: Min assist Stand pivot transfers: Min assist       General transfer comment: Increased time and cues due to dementia / attention.    Balance   Sitting-balance support: Feet supported;No upper extremity supported Sitting balance-Leahy Scale: Good     Standing balance support: Bilateral upper extremity supported;During functional activity Standing balance-Leahy Scale: Fair                             ADL either performed or assessed with clinical judgement   ADL Overall ADL's : Needs assistance/impaired Eating/Feeding: Independent   Grooming: Wash/dry hands;Wash/dry face;Oral care;Supervision/safety   Upper Body Bathing: Minimal assistance;Sitting   Lower Body Bathing: Minimal assistance;Moderate assistance    Upper Body Dressing : Minimal assistance   Lower Body Dressing: Maximal assistance   Toilet Transfer: Minimal assistance   Toileting- Clothing Manipulation and Hygiene: Minimal assistance       Functional mobility during ADLs: Minimal assistance       Vision Baseline Vision/History: Wears glasses Wears Glasses: At all times Patient Visual Report: No change from baseline       Perception     Praxis      Pertinent Vitals/Pain Pain Assessment: No/denies pain     Hand Dominance Right   Extremity/Trunk Assessment Upper Extremity Assessment Upper Extremity Assessment: LUE deficits/detail LUE Deficits / Details:  (L shld flex 90 degrees and aarom is 120) LUE Coordination: decreased fine motor           Communication Communication Communication: HOH   Cognition Arousal/Alertness: Awake/alert Behavior During Therapy: WFL for tasks assessed/performed Overall Cognitive Status: Impaired/Different from baseline Area of Impairment: Orientation;Attention;Memory;Following commands;Safety/judgement;Problem solving                 Orientation Level: Disoriented to;Situation;Time Current Attention Level: Alternating Memory: Decreased short-term memory;Decreased recall of precautions Following Commands: Follows one step commands with increased time;Follows one step commands inconsistently Safety/Judgement: Decreased awareness of safety;Decreased awareness of deficits   Problem Solving: Slow processing;Decreased initiation;Requires verbal cues;Requires tactile cues General Comments: Pt has dementia at baseline. No family present to determine if mental status has improved.   General Comments       Exercises     Shoulder Instructions  Home Living Family/patient expects to be discharged to:: Private residence Living Arrangements: Spouse/significant other Available Help at Discharge: Family;Available 24 hours/day Type of Home: House             Bathroom  Shower/Tub: Tub/shower unit   Bathroom Toilet: Handicapped height     Home Equipment: Environmental consultant - 2 wheels;Walker - 4 wheels;Hand held shower head          Prior Functioning/Environment Level of Independence: Needs assistance  Gait / Transfers Assistance Needed:  (Mod ) ADL's / Homemaking Assistance Needed:  (Pt. is s to Min a With UE ADL and is Min to Mod A iwth LE AD)   Comments: Assist of family at times, patient with dementia, refuses assistive devices.        OT Problem List: Decreased activity tolerance;Decreased safety awareness;Decreased knowledge of use of DME or AE      OT Treatment/Interventions: Self-care/ADL training;Neuromuscular education;Therapeutic activities;Patient/family education;Balance training    OT Goals(Current goals can be found in the care plan section) Acute Rehab OT Goals Patient Stated Goal: go home OT Goal Formulation: With patient/family Time For Goal Achievement: 03/04/17 Potential to Achieve Goals: Good ADL Goals Pt Will Perform Upper Body Bathing: with supervision;sitting Pt Will Perform Lower Body Bathing: with supervision;with set-up;with adaptive equipment;sit to/from stand Pt Will Perform Upper Body Dressing: with supervision;with set-up;sitting Pt Will Perform Lower Body Dressing: sit to/from stand;with set-up;with min assist Pt Will Transfer to Toilet: with supervision;ambulating Pt Will Perform Toileting - Clothing Manipulation and hygiene: with supervision;sit to/from stand  OT Frequency: Min 2X/week   Barriers to D/C:            Co-evaluation              End of Session Equipment Utilized During Treatment: Gait belt;Rolling walker  Activity Tolerance: Patient tolerated treatment well Patient left: in chair;with call bell/phone within reach;with family/visitor present  OT Visit Diagnosis: Unsteadiness on feet (R26.81);Other abnormalities of gait and mobility (R26.89)                Time: 1025-1103 OT Time Calculation  (min): 38 min Charges:  OT General Charges $OT Visit: 1 Procedure OT Evaluation $OT Eval Moderate Complexity: 1 Procedure OT Treatments $Self Care/Home Management : 8-22 mins G-Codes:     6 clicks, clinical judgement  Tylor Gambrill 02/18/2017, 11:01 AM

## 2017-02-18 NOTE — Progress Notes (Signed)
Physical Therapy Treatment Patient Details Name: Logan Valdez. MRN: [id number] DOB: 1946/04/11 Today's Date: 02/18/2017    History of Present Illness (P) 71 y.o. 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  Increased confusion and weakness.    PT Comments    No family present during session today. Decreased assist needed for all aspects of mobility. RW utilized for in room ambulation. +2 assist for safety would be helpful for hallway ambulation due to fall risk.   Follow Up Recommendations  Home health PT;Supervision/Assistance - 24 hour     Equipment Recommendations  Rolling walker with 5" wheels (if pt agreeable)    Recommendations for Other Services       Precautions / Restrictions Precautions Precautions: (P) Fall Restrictions Weight Bearing Restrictions: (P) No    Mobility  Bed Mobility         Supine to sit: HOB elevated;Min guard     General bed mobility comments: +rail, increased time to complete. No physical assist needed. Min guard for safety.  Transfers   Equipment used: Ambulation equipment used   Sit to Stand: Min assist Stand pivot transfers: Min assist       General transfer comment: Increased time and cues due to dementia / attention.  Ambulation/Gait Ambulation/Gait assistance: Min assist Ambulation Distance (Feet): 30 Feet Assistive device: Rolling walker (2 wheeled) Gait Pattern/deviations: Step-through pattern;Decreased stride length;Drifts right/left Gait velocity: decreased Gait velocity interpretation: Below normal speed for age/gender General Gait Details: Pt shaky and unsteady during stance. Agreeable to RW. Difficulty with RW management due to dementia but RW did help improve stability.   Stairs            Wheelchair Mobility    Modified Rankin (Stroke Patients Only)        Balance   Sitting-balance support: Feet supported;No upper extremity supported Sitting balance-Leahy Scale: Good     Standing balance support: Bilateral upper extremity supported;During functional activity Standing balance-Leahy Scale: Fair                              Cognition Arousal/Alertness: Awake/alert Behavior During Therapy: WFL for tasks assessed/performed Overall Cognitive Status: No family/caregiver present to determine baseline cognitive functioning Area of Impairment: Orientation;Attention;Memory;Following commands;Safety/judgement;Problem solving                 Orientation Level: Disoriented to;Situation;Time Current Attention Level: Alternating Memory: Decreased short-term memory;Decreased recall of precautions Following Commands: Follows one step commands with increased time;Follows one step commands inconsistently Safety/Judgement: Decreased awareness of safety;Decreased awareness of deficits   Problem Solving: Slow processing;Decreased initiation;Requires verbal cues;Requires tactile cues General Comments: Pt has dementia at baseline. No family present to determine if mental status has improved.      Exercises      General Comments        Pertinent Vitals/Pain Pain Assessment: No/denies pain    Home Living Family/patient expects to be discharged to:: (P) Private residence                    Prior Function            PT Goals (current goals can now be found in the care plan section) Acute Rehab PT Goals Patient Stated Goal: not stated PT Goal Formulation: With patient/family Time For Goal Achievement: 03/02/17 Potential to Achieve  Goals: Good Progress towards PT goals: Progressing toward goals    Frequency    Min 3X/week      PT Plan Equipment recommendations need to be updated    Co-evaluation             End of Session Equipment Utilized During Treatment: Gait belt Activity Tolerance: Patient  tolerated treatment well Patient left: in chair;with chair alarm set;with call bell/phone within reach Nurse Communication: Mobility status;Precautions PT Visit Diagnosis: Muscle weakness (generalized) (M62.81)     Time: 1001-1019 PT Time Calculation (min) (ACUTE ONLY): 18 min  Charges:  $Gait Training: 8-22 mins                    G Codes:       Aida Raider, PT  Office # [phone] Pager [phone]    Ilda Foil 02/18/2017, 10:32 AM

## 2017-02-18 NOTE — Progress Notes (Signed)
Patient ID: Logan Flank., male   DOB: Jul 31, 1946, 71 y.o.   MRN: [id number]    PROGRESS NOTE  Logan Flank.  YQI:[id number] DOB: 06/04/1946 DOA: 02/15/2017  PCP: Eustaquio Boyden, MD   Brief Narrative:   Patient admitted for evaluation of acute encephalopathy and dyspnea. This was thought to be secondary to viral URI and no ABX regimen initiated. Patient however, worse on AM 4/13 with Tmax 101.4 F, more wheezing on exam with mild tachypnea, respiratory rate 22. Empiric ABX started with solumedrol and recommended inpatient status for continued treatment.   Assessment & Plan:  Fever, leukocytosis, SIRS secondary to rhinoviral infection - due to concern of superimposed bacterial infection, empiric ABX started - pt reports feeling better but still with diffuse wheezing and rhonchi, non productive cough - still with low grade fever 99.6 F in the past 24 hours, WBC is now WNL - today is day #3/5 of Zithromax and rocephin, plan to change to PO doxycycline today  ? COPD in the setting of rhinovirus - keep on solumedrol and BD's scheduled and as needed - keep on oxygen via [place] for now  - keep on solumedrol today as pt still wheezing, change to prednisone in AM with taper over 5 days course  - empiric ABX as noted abvoe    Acute metabolic encephalopathy - secondary to the above - improving and suspect near baseline  - PT/OT eval done, HH orders placed   Bradycardia - resolved, OK to continue Metoprolol   Chronic back pain - Continue Norco PRN - Continue gabapentin  Hypertension - Continue metoprolol  History of stroke, history of hypercoagulable state - Continue warfarin  DVT prophylaxis: coumadin  Code Status: full  Family Communication: Patient and wife updated over the phone  Disposition Plan: home in am if better   Consultants:   None  Procedures:   None  Antimicrobials:   Zithromax 4/13 --> 4/15  Rocephin 4/13 --> 4/15  Doxycycline 4/15 -->  4/17  Subjective: Pt reports feeling better but still with exertional dyspnea and wheezing   Objective: Vitals:   02/17/17 1516 02/17/17 2130 02/18/17 0516 02/18/17 0816  BP:  (!) 151/56 (!) 157/64   Pulse:  73 71   Resp:  18 18   Temp:  98.4 F (36.9 C) 99 F (37.2 C)   TempSrc:      SpO2: 93% 97% 95% 95%  Weight:      Height:        Intake/Output Summary (Last 24 hours) at 02/18/17 1218 Last data filed at 02/18/17 3875  Gross per 24 hour  Intake              240 ml  Output             1175 ml  Net             -935 ml   Filed Weights   02/15/17 2337  Weight: 120.6 kg (265 lb 14 oz)    Examination:  General exam: Appears calm and comfortable  Respiratory system: Course breath sounds bilaterally with exp wheezing, rhonchi at bases  Cardiovascular system: S1 & S2 heard, RRR. No JVD, murmurs, rubs, gallops or clicks. No pedal edema. Gastrointestinal system: Abdomen is nondistended, soft and nontender. No organomegaly or masses felt. Normal bowel sounds heard. Central nervous system: Alert and oriented. No focal neurological deficits. Extremities: Symmetric 5 x 5 power.  Data Reviewed: I have personally reviewed following labs and imaging studies  CBC:  Recent Labs Lab 02/15/17 2315 02/15/17 2325 02/17/17 0535 02/18/17 0630  WBC 11.6*  --  9.3 10.9*  NEUTROABS 9.6*  --   --   --   HGB 13.2 14.6 13.3 13.7  HCT 40.9 43.0 40.8 41.8  MCV 91.7  --  91.9 91.9  PLT 137*  --  155 190   Basic Metabolic Panel:  Recent Labs Lab 02/15/17 2315 02/15/17 2325 02/17/17 0535 02/18/17 0630  NA 138 137 139 137  K 4.4 4.2 4.2 4.0  CL 99* 103 105 103  CO2 22  --  25 23  GLUCOSE 128* 132* 168* 179*  BUN CREATININE 1.02 0.90 0.98 1.05  CALCIUM 9.0  --  8.9 9.0   Liver Function Tests:  Recent Labs Lab 02/15/17 2315  AST 30  ALT 21  ALKPHOS 60  BILITOT 1.6*  PROT 6.8  ALBUMIN 3.9   Coagulation Profile:  Recent Labs Lab 02/15/17 2315  02/17/17 0535 02/18/17 0630  INR 1.98 1.54 1.50   CBG:  Recent Labs Lab 02/15/17 2319  GLUCAP 134*   Urine analysis:    Component Value Date/Time   COLORURINE YELLOW 02/16/2017 0042   APPEARANCEUR CLEAR 02/16/2017 0042   LABSPEC 1.023 02/16/2017 0042   PHURINE 6.0 02/16/2017 0042   GLUCOSEU NEGATIVE 02/16/2017 0042   HGBUR NEGATIVE 02/16/2017 0042   BILIRUBINUR NEGATIVE 02/16/2017 0042   BILIRUBINUR negative 05/06/2016 0955   KETONESUR 5 (A) 02/16/2017 0042   PROTEINUR NEGATIVE 02/16/2017 0042   UROBILINOGEN 1.0 05/06/2016 0955   UROBILINOGEN 0.2 08/31/2015 1405   NITRITE NEGATIVE 02/16/2017 0042   LEUKOCYTESUR NEGATIVE 02/16/2017 0042   Recent Results (from the past 240 hour(s))  Blood Culture (routine x 2)     Status: None (Preliminary result)   Collection Time: 02/15/17 11:40 PM  Result Value Ref Range Status   Specimen Description BLOOD RIGHT HAND  Final   Special Requests   Final    BOTTLES DRAWN AEROBIC AND ANAEROBIC Blood Culture adequate volume   Culture NO GROWTH 1 DAY  Final   Report Status PENDING  Incomplete  Blood Culture (routine x 2)     Status: None (Preliminary result)   Collection Time: 02/15/17 11:50 PM  Result Value Ref Range Status   Specimen Description BLOOD LEFT HAND  Final   Special Requests   Final    BOTTLES DRAWN AEROBIC AND ANAEROBIC Blood Culture adequate volume   Culture NO GROWTH 1 DAY  Final   Report Status PENDING  Incomplete  Respiratory Panel by PCR     Status: Abnormal   Collection Time: 02/16/17  3:45 AM  Result Value Ref Range Status   Adenovirus NOT DETECTED NOT DETECTED Final   Coronavirus 229E NOT DETECTED NOT DETECTED Final   Coronavirus HKU1 NOT DETECTED NOT DETECTED Final   Coronavirus NL63 NOT DETECTED NOT DETECTED Final   Coronavirus OC43 NOT DETECTED NOT DETECTED Final   Metapneumovirus NOT DETECTED NOT DETECTED Final   Rhinovirus / Enterovirus DETECTED (A) NOT DETECTED Final   Influenza A NOT DETECTED NOT  DETECTED Final   Influenza B NOT DETECTED NOT DETECTED Final   Parainfluenza Virus 1 NOT DETECTED NOT DETECTED Final   Parainfluenza Virus 2 NOT DETECTED NOT DETECTED Final   Parainfluenza Virus 3 NOT DETECTED NOT DETECTED Final   Parainfluenza Virus 4 NOT DETECTED NOT DETECTED Final   Respiratory Syncytial Virus NOT DETECTED NOT DETECTED Final   Bordetella pertussis NOT DETECTED NOT  DETECTED Final   Chlamydophila pneumoniae NOT DETECTED NOT DETECTED Final   Mycoplasma pneumoniae NOT DETECTED NOT DETECTED Final    Radiology Studies: No results found.  Scheduled Meds: . atorvastatin  20 mg Oral Daily  . azithromycin  500 mg Intravenous Q24H  . cefTRIAXone (ROCEPHIN)  IV  1 g Intravenous Q24H  . gabapentin  300 mg Oral TID  . guaiFENesin  600 mg Oral BID  . ipratropium-albuterol  3 mL Nebulization TID  . memantine  10 mg Oral BID  . methylPREDNISolone (SOLU-MEDROL) injection  40 mg Intravenous Q12H  . metoprolol tartrate  12.5 mg Oral BID  . pantoprazole  40 mg Oral Daily  . warfarin  5 mg Oral Once per day on Sun Mon Tue Thu Sat   And  . warfarin  2.5 mg Oral Once per day on Wed Fri  . Warfarin - Physician Dosing Inpatient   Does not apply q1800   Continuous Infusions:   LOS: 2 days   Time spent: 20 minutes   Debbora Presto, MD [facility] Pager [phone]  If 7PM-7AM, please contact night-coverage [email or web address] Password [facility] 02/18/2017, 12:18 PM

## 2017-02-19 LAB — BASIC METABOLIC PANEL
ANION GAP: 13 (ref 5–15)
BUN: 18 mg/dL (ref 6–20)
CALCIUM: 8.8 mg/dL — AB (ref 8.9–10.3)
CO2: 25 mmol/L (ref 22–32)
Chloride: 101 mmol/L (ref 101–111)
Creatinine, Ser: 1.01 mg/dL (ref 0.61–1.24)
GFR calc Af Amer: >60 mL/min (ref >60)
GLUCOSE: 174 mg/dL — AB (ref 65–99)
POTASSIUM: 4.1 mmol/L (ref 3.5–5.1)
SODIUM: 139 mmol/L (ref 135–145)

## 2017-02-19 LAB — CBC
HCT: 39.7 % (ref 39.0–52.0)
HEMOGLOBIN: 13 g/dL (ref 13.0–17.0)
MCH: 29.9 pg (ref 26.0–34.0)
MCHC: 32.7 g/dL (ref 30.0–36.0)
MCV: 91.3 fL (ref 78.0–100.0)
PLATELETS: 193 10*3/uL (ref 150–400)
RBC: 4.35 MIL/uL (ref 4.22–5.81)
RDW: 13.2 % (ref 11.5–15.5)
WBC: 10.8 10*3/uL — AB (ref 4.0–10.5)

## 2017-02-19 LAB — PROTIME-INR
INR: 1.64
Prothrombin Time: 19.6 seconds — ABNORMAL HIGH (ref 11.4–15.2)

## 2017-02-19 MED ORDER — PREDNISONE 10 MG PO TABS: ORAL_TABLET | ORAL | 0 refills | Status: DC

## 2017-02-19 MED ORDER — DOXYCYCLINE HYCLATE 100 MG PO TABS: 100.0000 mg | ORAL_TABLET | Freq: Two times a day (BID) | ORAL | 0 refills | Status: DC

## 2017-02-19 MED ORDER — IPRATROPIUM-ALBUTEROL 0.5-2.5 (3) MG/3ML IN SOLN: 3.0000 mL | RESPIRATORY_TRACT | 1 refills | Status: DC | PRN

## 2017-02-19 NOTE — Discharge Summary (Addendum)
Physician Discharge Summary  Worthy Flank. AVW:[id number] DOB: 08-28-1946 DOA: 02/15/2017  PCP: Eustaquio Boyden, MD  Admit date: 02/15/2017 Discharge date: 02/19/2017  Recommendations for Outpatient Follow-up:  1. Pt will need to follow up with PCP in 1-2 weeks post discharge 2. Please obtain BMP to evaluate electrolytes and kidney function 3. Please also check CBC to evaluate Hg and Hct levels 4. Complete therapy with doxycycline for 4 more days post discharge 5. Complete prednisone taper over the 5 days course, please see below  6. Pt advised to continue follow up for coumadin dosing  7. Please note that during hospital stay, HR dropped at one point to mid 50's and dose of metoprolol lowered from 25 mg to 12.5 mg PO BID, HR is now back to stable target range and pt advised to continue taking Metoprolol per previous home regimen 8. Please follow up on HR control and readjust the dose of Metoprolol as clinically indicated   Discharge Diagnoses:  Principal Problem:   Altered mental status Active Problems:   Diabetes mellitus type 2, controlled, with complications (HCC)   Primary hypercoagulable state (HCC)   HTN (hypertension)   Vascular dementia   Chronic back pain   Fever  Discharge Condition: Stable  Diet recommendation: Heart healthy diet discussed in details   Brief Narrative:  Patient admitted for evaluation of acute encephalopathy and dyspnea. This was thought to be secondary to viral URI and no ABX regimen initiated. Patient however, worse on AM 4/13 with Tmax 101.4 F, more wheezing on exam with mild tachypnea, respiratory rate 22. Empiric ABX started with solumedrol and recommended inpatient status for continued treatment.   Assessment & Plan:  Fever, leukocytosis, SIRS secondary to rhinoviral infection - due to concern of superimposed bacterial infection, empiric ABX started - pt reports feeling better and wants to go home  - will continue doxycycline  upon discharge for 4 more days - pt will also need to complete prednisone taper over the 5 days course  Acute exacerbation of COPD in the setting of rhinovirus - minimal wheezing on exam this AM - transition to Prednisone taper on discharge - also keep bronchodilator as needed   Acute metabolic encephalopathy - secondary to the above - at baseline per wife   Bradycardia - resolved, OK to continue Metoprolol with close follow up  Chronic back pain - Continue Norco PRN - Continue gabapentin  Hypertension - Continue metoprolol  History of stroke, history of hypercoagulable state - Continue warfarin  DVT prophylaxis: coumadin  Code Status: full  Family Communication: Patient and wife updated at bedside this am, both in agreement with pt going home today  Disposition Plan: home   Consultants:   None  Procedures:   None  Antimicrobials:   Zithromax 4/13 --> 4/15  Rocephin 4/13 --> 4/15  Doxycycline 4/15 --> 4 more days post discharge   Procedures/Studies: Ct Chest Wo Contrast  Result Date: 02/16/2017 CLINICAL DATA:  Dyspnea EXAM: CT CHEST WITHOUT CONTRAST TECHNIQUE: Multidetector CT imaging of the chest was performed following the standard protocol without IV contrast. COMPARISON:  Chest x-ray 02/16/2017 FINDINGS: Cardiovascular: Heart size within normal limits with prominent epicardial fat. No pericardial effusion. Mild coronary calcification. Aortic valve calcification. Pulmonary arteries normal in caliber. Mediastinum/Nodes: Prominent mediastinal fat.  No mass or adenopathy Lungs/Pleura: Negative for infiltrate or effusion. No pulmonary mass or nodule. Mild apical pleural plaque bilaterally with mild amount of calcification in the plaque. No mass lesion Upper Abdomen: Negative Musculoskeletal: No  acute skeletal abnormality IMPRESSION: Lungs are clear without infiltrate effusion or mass. Mild calcified pleural plaques bilaterally.  No effusion Mild coronary  calcification. Electronically Signed   By: Marlan Palau M.D.   On: 02/16/2017 12:09   Dg Chest Portable 1 View  Result Date: 02/16/2017 CLINICAL DATA:  Difficulty standing and ambulating.  Fever. EXAM: PORTABLE CHEST 1 VIEW COMPARISON:  01/09/2016 CXR FINDINGS: Low lung volumes. Borderline cardiomegaly. Aortic atherosclerosis. No pneumonic consolidation, CHF nor effusion. No acute osseous appearing abnormality. IMPRESSION: No active disease.  Aortic atherosclerosis. Electronically Signed   By: Tollie Eth M.D.   On: 02/16/2017 00:15   Ct Head Code Stroke W/o Cm  Result Date: 02/15/2017 CLINICAL DATA:  Code stroke. Initial evaluation for acute left arm weakness. EXAM: CT HEAD WITHOUT CONTRAST TECHNIQUE: Contiguous axial images were obtained from the base of the skull through the vertex without intravenous contrast. COMPARISON:  Prior CT from 08/31/2015. FINDINGS: Brain: Generalized cerebral atrophy with mild chronic small vessel ischemic disease. Encephalomalacia within the left occipital region compatible with remote left PCA territory infarct. No acute intracranial hemorrhage. No evidence for acute large vessel territory infarct. No mass lesion, midline shift or mass effect. No hydrocephalus. No extra-axial fluid collection. Vascular: No hyperdense vessel. Scattered vascular calcifications noted within the carotid siphons. Skull: Scalp soft tissues within normal limits.  Calvarium intact. Sinuses/Orbits: Visualized globes and orbital soft tissues within normal limits. Scattered mucosal thickening within the ethmoidal air cells. Paranasal sinuses otherwise clear. No mastoid effusion. ASPECTS [facility] Stroke Program Early CT Score) - Ganglionic level infarction (caudate, lentiform nuclei, internal capsule, insula, M1-M3 cortex): 7 - Supraganglionic infarction (M4-M6 cortex): 3 Total score (0-10 with 10 being normal): 10 IMPRESSION: 1. No acute intracranial infarct or other process identified. 2. ASPECTS is  10 3. Stable atrophy with chronic microvascular ischemic disease and remote left PCA territory infarct. Critical Value/emergent results were called by telephone at the time of interpretation on 02/15/2017 at 11:53 pm to Dr. Amada Jupiter, who verbally acknowledged these results. Electronically Signed   By: Rise Mu M.D.   On: 02/15/2017 23:56     Discharge Exam: Vitals:   02/19/17 0555 02/19/17 0829  BP: (!) 172/67 (!) 163/77  Pulse: 70 69  Resp: 18   Temp: 98.1 F (36.7 C)    Vitals:   02/18/17 2213 02/19/17 0555 02/19/17 0829 02/19/17 0935  BP: (!) 148/59 (!) 172/67 (!) 163/77   Pulse: 79 70 69   Resp: 18 18    Temp: 97.7 F (36.5 C) 98.1 F (36.7 C)    TempSrc:      SpO2: 95% 96%  96%  Weight:      Height:        General: Pt is alert, follows commands appropriately, not in acute distress Cardiovascular: Regular rate and rhythm, S1/S2 +, no rubs, no gallops Respiratory: diminished breath sounds at bases with mild exp wheezing in upper lobes  Abdominal: Soft, non tender, non distended, bowel sounds +, no guarding Extremities: no cyanosis, pulses palpable bilaterally DP and PT Neuro: Grossly nonfocal  Discharge Instructions  Discharge Instructions    Diet - low sodium heart healthy    Complete by:  As directed    Increase activity slowly    Complete by:  As directed      Allergies as of 02/19/2017      Reactions   Hydrochlorothiazide Other (See Comments)   Mental changes    Indomethacin    syncope   Pravastatin Sodium  REACTION: rash 11/09   Lisinopril    REACTION: HA   Micardis [telmisartan]    REACTION: HA      Medication List    TAKE these medications   acetaminophen 500 MG tablet Commonly known as:  TYLENOL Take 500 mg by mouth 3 (three) times daily.   atorvastatin 20 MG tablet Commonly known as:  LIPITOR TAKE 1 TABLET BY MOUTH DAILY   DELSYM 30 MG/5ML liquid Generic drug:  dextromethorphan Take 15-60 mg by mouth 2 (two) times daily  as needed for cough.   docusate sodium 100 MG capsule Commonly known as:  COLACE Take 100 mg by mouth daily as needed for mild constipation.   doxycycline 100 MG tablet Commonly known as:  VIBRA-TABS Take 1 tablet (100 mg total) by mouth every 12 (twelve) hours.   finasteride 5 MG tablet Commonly known as:  PROSCAR TAKE 1 TABLET BY MOUTH DAILY   FOLIC ACID PO Take 3 tablets by mouth 2 (two) times daily.   gabapentin 300 MG capsule Commonly known as:  NEURONTIN TAKE ONE CAPSULE BY MOUTH THREE TIMES DAILY   guaiFENesin 600 MG 12 hr tablet Commonly known as:  MUCINEX Take 600-1,200 mg by mouth 2 (two) times daily as needed for cough or to loosen phlegm.   HYDROcodone-acetaminophen 10-325 MG tablet Commonly known as:  NORCO Take 1 tablet by mouth every 6 (six) hours as needed for moderate pain.   ipratropium-albuterol 0.5-2.5 (3) MG/3ML Soln Commonly known as:  DUONEB Take 3 mLs by nebulization every 2 (two) hours as needed.   memantine 10 MG tablet Commonly known as:  NAMENDA Take 1 tablet (10 mg total) by mouth 2 (two) times daily.   metoprolol tartrate 25 MG tablet Commonly known as:  LOPRESSOR TAKE 1 TABLET BY MOUTH TWICE A DAY   polyethylene glycol powder powder Commonly known as:  GLYCOLAX/MIRALAX DISSOLVE ONE (1) CAPFUL (17GM) INTO 4 TO8 OUNCES OF FLUID AND TAKE BYMOUTH ONCE DAILY AS DIRECTED   predniSONE 10 MG tablet Commonly known as:  DELTASONE Take 50 mg tabled on 02/20/2017 and taper down by 10 mg daily until completed   triamcinolone cream 0.1 % Commonly known as:  KENALOG Apply 1 application topically 2 (two) times daily. Apply to AA. What changed:  when to take this  reasons to take this  additional instructions   VITAMIN B-12 PO Take 2 tablets by mouth 2 (two) times daily.   VITAMIN B-6 PO Take 1 tablet by mouth 2 (two) times daily.   warfarin 5 MG tablet Commonly known as:  COUMADIN TAKE AS DIRECTED BY COUMADIN CLINIC What changed:  See  the new instructions.      Follow-up Information    Eustaquio Boyden, MD Follow up.   Specialty:  Family Medicine Contact information: [address] [place] [address] [phone]        Debbora Presto, MD Follow up.   Specialty:  Internal Medicine Why:  call my cell phone iwth any questions please [phone] Contact information: [address] [place] [address] [phone]          The results of significant diagnostics from this hospitalization (including imaging, microbiology, ancillary and laboratory) are listed below for reference.    Microbiology: Recent Results (from the past 240 hour(s))  Blood Culture (routine x 2)     Status: None (Preliminary result)   Collection Time: 02/15/17 11:40 PM  Result Value Ref Range Status   Specimen Description BLOOD  RIGHT HAND  Final   Special Requests   Final    BOTTLES DRAWN AEROBIC AND ANAEROBIC Blood Culture adequate volume   Culture NO GROWTH 2 DAYS  Final   Report Status PENDING  Incomplete  Blood Culture (routine x 2)     Status: None (Preliminary result)   Collection Time: 02/15/17 11:50 PM  Result Value Ref Range Status   Specimen Description BLOOD LEFT HAND  Final   Special Requests   Final    BOTTLES DRAWN AEROBIC AND ANAEROBIC Blood Culture adequate volume   Culture NO GROWTH 2 DAYS  Final   Report Status PENDING  Incomplete  Respiratory Panel by PCR     Status: Abnormal   Collection Time: 02/16/17  3:45 AM  Result Value Ref Range Status   Adenovirus NOT DETECTED NOT DETECTED Final   Coronavirus 229E NOT DETECTED NOT DETECTED Final   Coronavirus HKU1 NOT DETECTED NOT DETECTED Final   Coronavirus NL63 NOT DETECTED NOT DETECTED Final   Coronavirus OC43 NOT DETECTED NOT DETECTED Final   Metapneumovirus NOT DETECTED NOT DETECTED Final   Rhinovirus / Enterovirus DETECTED (A) NOT DETECTED Final   Influenza A NOT DETECTED NOT DETECTED Final   Influenza B NOT DETECTED NOT  DETECTED Final   Parainfluenza Virus 1 NOT DETECTED NOT DETECTED Final   Parainfluenza Virus 2 NOT DETECTED NOT DETECTED Final   Parainfluenza Virus 3 NOT DETECTED NOT DETECTED Final   Parainfluenza Virus 4 NOT DETECTED NOT DETECTED Final   Respiratory Syncytial Virus NOT DETECTED NOT DETECTED Final   Bordetella pertussis NOT DETECTED NOT DETECTED Final   Chlamydophila pneumoniae NOT DETECTED NOT DETECTED Final   Mycoplasma pneumoniae NOT DETECTED NOT DETECTED Final     Labs: Basic Metabolic Panel:  Recent Labs Lab 02/15/17 2315 02/15/17 2325 02/17/17 0535 02/18/17 0630 02/19/17 0415  NA 138 137 139 137 139  K 4.4 4.2 4.2 4.0 4.1  CL 99* 103 105 103 101  CO2 22  --  GLUCOSE 128* 132* 168* 179* 174*  BUN CREATININE 1.02 0.90 0.98 1.05 1.01  CALCIUM 9.0  --  8.9 9.0 8.8*   Liver Function Tests:  Recent Labs Lab 02/15/17 2315  AST 30  ALT 21  ALKPHOS 60  BILITOT 1.6*  PROT 6.8  ALBUMIN 3.9   CBC:  Recent Labs Lab 02/15/17 2315 02/15/17 2325 02/17/17 0535 02/18/17 0630 02/19/17 0415  WBC 11.6*  --  9.3 10.9* 10.8*  NEUTROABS 9.6*  --   --   --   --   HGB 13.2 14.6 13.3 13.7 13.0  HCT 40.9 43.0 40.8 41.8 39.7  MCV 91.7  --  91.9 91.9 91.3  PLT 137*  --  155 190 193   CBG:  Recent Labs Lab 02/15/17 2319  GLUCAP 134*   SIGNED: Time coordinating discharge: 30 minutes  Debbora Presto, MD  [facility] 02/19/2017, 10:25 AM Pager [phone]  If 7PM-7AM, please contact night-coverage [email or web address] Password [email or web address]

## 2017-02-19 NOTE — Progress Notes (Addendum)
Worthy Flank. to be D/C'd to home with home health per MD order.  Discussed with the patient and all questions fully answered.  VSS, Skin clean, dry and intact without evidence of skin break down, no evidence of skin tears noted. IV catheter discontinued intact. Site without signs and symptoms of complications. Dressing and pressure applied.  An After Visit Summary was printed and given to the patient. Patient received prescriptions, nebulizer machine and rolling walker.  D/c education completed with patient/family including follow up instructions, medication list, d/c activities limitations if indicated, with other d/c instructions as indicated by MD - patient able to verbalize understanding, all questions fully answered.   Patient instructed to return to ED, call 911, or call MD for any changes in condition.   Patient escorted via WC, and D/C to home via private auto.  Sadao Weyer L Price 02/19/2017 1:12 PM

## 2017-02-19 NOTE — Discharge Instructions (Signed)

## 2017-02-19 NOTE — Progress Notes (Signed)
Physical Therapy Treatment Patient Details Name: Logan Valdez. MRN: [id number] DOB: 11/06/1946 Today's Date: 02/19/2017    History of Present Illness 71 y.o. 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  Increased confusion and weakness.    PT Comments    Pt performed increased activity and mobility.  Assisted patient with donning lower and upper body dressing and educated patient on putting on pants in a seated position.  Pt will require a RW at d/c and case manager informed of need.  Pt will d/c home with assist from wife until his balance improves.  Pt remains unsteady and will receive HHPT to improve these deficits.      Follow Up Recommendations  Home health PT;Supervision/Assistance - 24 hour     Equipment Recommendations  Rolling walker with 5" wheels    Recommendations for Other Services       Precautions / Restrictions Precautions Precautions: Fall Restrictions Weight Bearing Restrictions: No    Mobility  Bed Mobility Overal bed mobility: Needs Assistance Bed Mobility: Supine to Sit     Supine to sit: HOB elevated;Min guard     General bed mobility comments: +rail, increased time to complete. No physical assist needed. Min guard for safety.  Transfers Overall transfer level: Needs assistance Equipment used: Rolling walker (2 wheeled);None Transfers: Sit to/from Stand Sit to Stand: Min guard         General transfer comment: Increased time and cues due to dementia / attention.  Pt able to stand erect with improved balance in static stance when using the RW.  Pt requires min-in guard A with no device.    Ambulation/Gait Ambulation/Gait assistance: Min assist;Min guard Ambulation Distance (Feet): 220 Feet Assistive device: Rolling walker (2 wheeled) Gait Pattern/deviations: Step-through pattern;Decreased  stride length;Drifts right/left Gait velocity: decreased Gait velocity interpretation: Below normal speed for age/gender General Gait Details: Cues for position in RW to improve control and safety with use of RW.  Pt remains to require constant redirection due to demetia.  Pt requires max VCs to focus on task.     Stairs            Wheelchair Mobility    Modified Rankin (Stroke Patients Only)       Balance Overall balance assessment: Needs assistance   Sitting balance-Leahy Scale: Good       Standing balance-Leahy Scale: Fair                              Cognition Arousal/Alertness: Awake/alert Behavior During Therapy: WFL for tasks assessed/performed Overall Cognitive Status: Impaired/Different from baseline                                 General Comments: Pt has dementia at baseline.       Exercises      General Comments        Pertinent Vitals/Pain Pain Assessment: No/denies pain    Home Living                      Prior Function            PT Goals (current goals can now be found in the care plan section) Acute Rehab PT Goals Patient Stated Goal: go home Potential  to Achieve Goals: Good Progress towards PT goals: Progressing toward goals    Frequency    Min 3X/week      PT Plan Equipment recommendations need to be updated    Co-evaluation             End of Session Equipment Utilized During Treatment: Gait belt Activity Tolerance: Patient tolerated treatment well Patient left: in chair;with chair alarm set;with call bell/phone within reach Nurse Communication: Mobility status;Precautions PT Visit Diagnosis: Muscle weakness (generalized) (M62.81)     Time: 6578-4696 PT Time Calculation (min) (ACUTE ONLY): 23 min  Charges:  $Gait Training: 8-22 mins $Therapeutic Activity: 8-22 mins                    G CodesJoycelyn Rua, PTA pager [phone]    Florestine Avers 02/19/2017,  11:20 AM

## 2017-02-19 NOTE — Care Management Note (Addendum)
Case Management Note  Patient Details  Name: Logan Valdez. MRN: [id number] Date of Birth: [birth year]  Subjective/Objective:          Admitted with AMS.        PCP: Eustaquio Boyden  Action/Plan:  Plan is to d/c to home today.  Expected Discharge Date:  02/19/17               Expected Discharge Plan:  Home w Home Health Services  In-House Referral:     Discharge planning Services  CM Consult  Post Acute Care Choice:    Choice offered to:  Patient  DME Arranged:  Dan Humphreys rolling, Nebulizer machine DME Agency:  [facility] referral made with Lupita Leash @ [phone]  [facility] Arranged:   PT.OT,NA HH Agency:    [facility]  Status of Service:  Completed, signed off  If discussed at Long Length of Stay Meetings, dates discussed:    Additional Comments:  Epifanio Lesches, RN 02/19/2017, 11:50 AM

## 2017-02-20 ENCOUNTER — Telehealth: Payer: Self-pay | Admitting: *Deleted

## 2017-02-20 NOTE — Consult Note (Signed)
           [facility] CM Primary Care Navigator  02/20/2017  Logan Valdez. 1945-12-30 [id number]   Rhett Bannister see patient at the bedside to identify possible discharge needs but he was alreadydischarged per staff.  Patient was discharged home yesterday with home health services.  Primary care provider's office called [facility] notified of patient's discharge and need for post hospital follow-up, transition of care as well as health issues needing follow-up.  For questions, please contact:  Wyatt Haste, BSN, RN- [facility] Primary Care Navigator  Telephone: [phone] [facility]

## 2017-02-20 NOTE — Telephone Encounter (Signed)
Lm requesting return call to complete TCM and confirm hosp f/u appt  

## 2017-02-21 LAB — CULTURE, BLOOD (ROUTINE X 2)
Culture: NO GROWTH
Culture: NO GROWTH
Special Requests: ADEQUATE
Special Requests: ADEQUATE

## 2017-02-21 NOTE — Telephone Encounter (Signed)
Lm requesting return call to complete TCM and confirm hosp f/u appt  Call was also received from [facility] indicating pt is needing labs when seen at [facility]

## 2017-03-05 ENCOUNTER — Encounter: Payer: Self-pay | Admitting: Family Medicine

## 2017-03-05 ENCOUNTER — Ambulatory Visit (INDEPENDENT_AMBULATORY_CARE_PROVIDER_SITE_OTHER): Payer: PPO | Admitting: Family Medicine

## 2017-03-05 ENCOUNTER — Ambulatory Visit (INDEPENDENT_AMBULATORY_CARE_PROVIDER_SITE_OTHER): Payer: PPO

## 2017-03-05 VITALS — BP 142/82 | HR 53 | Temp 98.0°F | Wt 258.2 lb

## 2017-03-05 DIAGNOSIS — Z7189 Other specified counseling: Secondary | ICD-10-CM | POA: Diagnosis not present

## 2017-03-05 DIAGNOSIS — Q211 Atrial septal defect: Secondary | ICD-10-CM

## 2017-03-05 DIAGNOSIS — Z8673 Personal history of transient ischemic attack (TIA), and cerebral infarction without residual deficits: Secondary | ICD-10-CM

## 2017-03-05 DIAGNOSIS — Z5181 Encounter for therapeutic drug level monitoring: Secondary | ICD-10-CM | POA: Diagnosis not present

## 2017-03-05 DIAGNOSIS — J441 Chronic obstructive pulmonary disease with (acute) exacerbation: Secondary | ICD-10-CM

## 2017-03-05 DIAGNOSIS — R404 Transient alteration of awareness: Secondary | ICD-10-CM

## 2017-03-05 DIAGNOSIS — J449 Chronic obstructive pulmonary disease, unspecified: Secondary | ICD-10-CM | POA: Insufficient documentation

## 2017-03-05 DIAGNOSIS — Q2112 Patent foramen ovale: Secondary | ICD-10-CM

## 2017-03-05 DIAGNOSIS — I10 Essential (primary) hypertension: Secondary | ICD-10-CM | POA: Diagnosis not present

## 2017-03-05 LAB — POCT INR: INR: 4.7

## 2017-03-05 MED ORDER — AMLODIPINE BESYLATE 2.5 MG PO TABS: 2.5000 mg | ORAL_TABLET | Freq: Every day | ORAL | 6 refills | Status: DC

## 2017-03-05 NOTE — Patient Instructions (Signed)
Pre visit review using our clinic review tool, if applicable. No additional management support is needed unless otherwise documented below in the visit note. 

## 2017-03-05 NOTE — Progress Notes (Signed)
Pre visit review using our clinic review tool, if applicable. No additional management support is needed unless otherwise documented below in the visit note. 

## 2017-03-05 NOTE — Patient Instructions (Addendum)
No labs today.  I'm glad you're doing better Decrease metoprolol to 1/2 tablet twice daily. Start amlodipine 2.5mg  once daily Return in 3.5 months for medicare wellness and physical.

## 2017-03-05 NOTE — Progress Notes (Signed)
BP (!) 142/82   Pulse (!) 53   Temp 98 F (36.7 C) (Oral)   Wt 258 lb 4 oz (117.1 kg)   SpO2 99%   BMI 38.14 kg/m    CC: hosp f/u visit Subjective:    Patient ID: Logan Valdez., male    DOB: Apr 11, 1946, 71 y.o.   MRN: [id number]  HPI: Logan Valdez. is a 71 y.o. male presenting on 03/05/2017 for Hospitalization Follow-up   Recent hospitalization for acute encephalopathy with dyspnea, initially thought code stroke s/p stable CT head. Attributed to acute COPD exacerbation. Treated with doxycycline and prednisone courses. Records reviewed. blcx neg x2, respiratory panel PCR + rhinovirus.   Bradycardia - metoprolol dose was decreased, but was returned to prior dose upon discharge.   Brings HCPOA form for chart.  h/o remote L PCA infarct.   Admit date: 02/15/2017 Discharge date: 02/19/2017 f/u phone call not performed - pt never returned call.   Recommendations for Outpatient Follow-up:  1. Pt will need to follow up with PCP in 1-2 weeks post discharge 2. Please obtain BMP to evaluate electrolytes and kidney function 3. Please also check CBC to evaluate Hg and Hct levels 4. Complete therapy with doxycycline for 4 more days post discharge 5. Complete prednisone taper over the 5 days course, please see below  6. Pt advised to continue follow up for coumadin dosing  7. Please note that during hospital stay, HR dropped at one point to mid 50's and dose of metoprolol lowered from 25 mg to 12.5 mg PO BID, HR is now back to stable target range and pt advised to continue taking Metoprolol per previous home regimen 8. Please follow up on HR control and readjust the dose of Metoprolol as clinically indicated   Discharge Diagnoses:  Principal Problem:   Altered mental status Active Problems:   Diabetes mellitus type 2, controlled, with complications (HCC)   Primary hypercoagulable state (HCC)   HTN (hypertension)   Vascular dementia   Chronic back pain    Fever  Discharge Condition: Stable Diet recommendation: Heart healthy diet discussed in details   Relevant past medical, surgical, family and social history reviewed and updated as indicated. Interim medical history since our last visit reviewed. Allergies and medications reviewed and updated. Outpatient Medications Prior to Visit  Medication Sig Dispense Refill  . acetaminophen (TYLENOL) 500 MG tablet Take 500 mg by mouth 3 (three) times daily.    Marland Kitchen atorvastatin (LIPITOR) 20 MG tablet TAKE 1 TABLET BY MOUTH DAILY 30 tablet 11  . Cyanocobalamin (VITAMIN B-12 PO) Take 2 tablets by mouth 2 (two) times daily.    Marland Kitchen dextromethorphan (DELSYM) 30 MG/5ML liquid Take 15-60 mg by mouth 2 (two) times daily as needed for cough.    . docusate sodium (COLACE) 100 MG capsule Take 100 mg by mouth daily as needed for mild constipation.    . finasteride (PROSCAR) 5 MG tablet TAKE 1 TABLET BY MOUTH DAILY 30 tablet 6  . FOLIC ACID PO Take 3 tablets by mouth 2 (two) times daily.    Marland Kitchen gabapentin (NEURONTIN) 300 MG capsule TAKE ONE CAPSULE BY MOUTH THREE TIMES DAILY 270 capsule 3  . guaiFENesin (MUCINEX) 600 MG 12 hr tablet Take 600-1,200 mg by mouth 2 (two) times daily as needed for cough or to loosen phlegm.    Marland Kitchen HYDROcodone-acetaminophen (NORCO) 10-325 MG per tablet Take 1 tablet by mouth every 6 (six) hours as needed for moderate pain.     Marland Kitchen  ipratropium-albuterol (DUONEB) 0.5-2.5 (3) MG/3ML SOLN Take 3 mLs by nebulization every 2 (two) hours as needed. 360 mL 1  . memantine (NAMENDA) 10 MG tablet Take 1 tablet (10 mg total) by mouth 2 (two) times daily. 60 tablet 7  . polyethylene glycol powder (GLYCOLAX/MIRALAX) powder DISSOLVE ONE (1) CAPFUL (17GM) INTO 4 TO8 OUNCES OF FLUID AND TAKE BYMOUTH ONCE DAILY AS DIRECTED 255 g 3  . Pyridoxine HCl (VITAMIN B-6 PO) Take 1 tablet by mouth 2 (two) times daily.    Marland Kitchen triamcinolone cream (KENALOG) 0.1 % Apply 1 application topically 2 (two) times daily. Apply to AA.  (Patient taking differently: Apply 1 application topically 2 (two) times daily as needed (rash). Apply to AA.) 30 g 0  . warfarin (COUMADIN) 5 MG tablet TAKE AS DIRECTED BY COUMADIN CLINIC (Patient taking differently: Take 2.5 mg on Wednesday and Friday, all other days take 5 mg.) 90 tablet 0  . doxycycline (VIBRA-TABS) 100 MG tablet Take 1 tablet (100 mg total) by mouth every 12 (twelve) hours. 8 tablet 0  . metoprolol tartrate (LOPRESSOR) 25 MG tablet TAKE 1 TABLET BY MOUTH TWICE A DAY 180 tablet 1  . predniSONE (DELTASONE) 10 MG tablet Take 50 mg tabled on 02/20/2017 and taper down by 10 mg daily until completed 15 tablet 0   No facility-administered medications prior to visit.      Per HPI unless specifically indicated in ROS section below Review of Systems     Objective:    BP (!) 142/82   Pulse (!) 53   Temp 98 F (36.7 C) (Oral)   Wt 258 lb 4 oz (117.1 kg)   SpO2 99%   BMI 38.14 kg/m   Wt Readings from Last 3 Encounters:  03/05/17 258 lb 4 oz (117.1 kg)  02/15/17 265 lb 14 oz (120.6 kg)  10/04/16 269 lb (122 kg)    Physical Exam  Constitutional: He appears well-developed and well-nourished. No distress.  HENT:  Mouth/Throat: Oropharynx is clear and moist. No oropharyngeal exudate.  Cardiovascular: Normal rate, regular rhythm and intact distal pulses.   Murmur (holosystolic murmur 3/6) heard. Pulmonary/Chest: Effort normal and breath sounds normal. No respiratory distress. He has no wheezes. He has no rales.  Musculoskeletal: He exhibits no edema.  Skin: Skin is warm and dry. No rash noted.  Nursing note and vitals reviewed.  Lab Results  Component Value Date   HGBA1C 6.3 05/31/2016      Assessment & Plan:   Problem List Items Addressed This Visit    Advanced care planning/counseling discussion    Advanced directive: has at home, HCPOA form scanned 03/2017. Wife [name] is HCPOA.       Altered mental status - Primary    AMS attributed to recent COPD exacerbation.  Overall improved. I don't think labs are warranted at this time. RTC 4 mo CPE/AMW. Continue duoneb PRN.       COPD with acute exacerbation (HCC)    ?dx as never smoker.  Treated with doxy/prednisone course at hospitalization, and now resolved.  Continue to monitor, consider spirometry.       Encounter for therapeutic drug monitoring    Currently supratherapeutic - our coumadin RN has dosed coumadin. Reviewed plan with patient.       History of stroke   HTN (hypertension)    Chronic. Now with bradycardia and mildly elevated readings - will decrease metoprolol to 12.5mg  bid, start amlodipine 2.5mg  daily. Reassess at CPE.  Relevant Medications   metoprolol tartrate (LOPRESSOR) 25 MG tablet   amLODipine (NORVASC) 2.5 MG tablet   PFO (patent foramen ovale)    Stable murmur.      Relevant Medications   metoprolol tartrate (LOPRESSOR) 25 MG tablet   amLODipine (NORVASC) 2.5 MG tablet       Follow up plan: Return in about 3 months (around 06/09/2017) for annual exam, prior fasting for blood work, medicare wellness visit.  Eustaquio Boyden, MD

## 2017-03-05 NOTE — Assessment & Plan Note (Signed)
AMS attributed to recent COPD exacerbation. Overall improved. I don't think labs are warranted at this time. RTC 4 mo CPE/AMW. Continue duoneb PRN.

## 2017-03-05 NOTE — Assessment & Plan Note (Signed)
Advanced directive: has at home, HCPOA form scanned 03/2017. Wife [name] is HCPOA.

## 2017-03-05 NOTE — Assessment & Plan Note (Signed)
Stable murmur

## 2017-03-05 NOTE — Assessment & Plan Note (Signed)
?  dx as never smoker.  Treated with doxy/prednisone course at hospitalization, and now resolved.  Continue to monitor, consider spirometry.

## 2017-03-05 NOTE — Assessment & Plan Note (Signed)
Currently supratherapeutic - our coumadin RN has dosed coumadin. Reviewed plan with patient.

## 2017-03-05 NOTE — Assessment & Plan Note (Signed)
Chronic. Now with bradycardia and mildly elevated readings - will decrease metoprolol to 12.5mg  bid, start amlodipine 2.5mg  daily. Reassess at CPE.

## 2017-03-15 ENCOUNTER — Ambulatory Visit: Payer: Self-pay | Admitting: Neurology

## 2017-03-16 ENCOUNTER — Ambulatory Visit (INDEPENDENT_AMBULATORY_CARE_PROVIDER_SITE_OTHER): Payer: PPO

## 2017-03-16 DIAGNOSIS — Z5181 Encounter for therapeutic drug level monitoring: Secondary | ICD-10-CM

## 2017-03-16 LAB — POCT INR: INR: 1.7

## 2017-03-16 NOTE — Patient Instructions (Signed)
Pre visit review using our clinic review tool, if applicable. No additional management support is needed unless otherwise documented below in the visit note. 

## 2017-04-03 ENCOUNTER — Other Ambulatory Visit: Payer: Self-pay | Admitting: Nurse Practitioner

## 2017-04-03 ENCOUNTER — Ambulatory Visit (INDEPENDENT_AMBULATORY_CARE_PROVIDER_SITE_OTHER): Payer: PPO

## 2017-04-03 DIAGNOSIS — Z5181 Encounter for therapeutic drug level monitoring: Secondary | ICD-10-CM | POA: Diagnosis not present

## 2017-04-03 LAB — POCT INR: INR: 1.8

## 2017-04-03 NOTE — Patient Instructions (Signed)
Pre visit review using our clinic review tool, if applicable. No additional management support is needed unless otherwise documented below in the visit note. 

## 2017-04-24 ENCOUNTER — Ambulatory Visit (INDEPENDENT_AMBULATORY_CARE_PROVIDER_SITE_OTHER): Payer: PPO

## 2017-04-24 DIAGNOSIS — Z5181 Encounter for therapeutic drug level monitoring: Secondary | ICD-10-CM

## 2017-04-24 LAB — POCT INR: INR: 2.2

## 2017-04-24 NOTE — Patient Instructions (Signed)
Pre visit review using our clinic review tool, if applicable. No additional management support is needed unless otherwise documented below in the visit note. 

## 2017-04-25 DIAGNOSIS — G8929 Other chronic pain: Secondary | ICD-10-CM | POA: Diagnosis not present

## 2017-04-25 DIAGNOSIS — Z79891 Long term (current) use of opiate analgesic: Secondary | ICD-10-CM | POA: Diagnosis not present

## 2017-04-25 DIAGNOSIS — M5442 Lumbago with sciatica, left side: Secondary | ICD-10-CM | POA: Diagnosis not present

## 2017-04-25 DIAGNOSIS — G894 Chronic pain syndrome: Secondary | ICD-10-CM | POA: Diagnosis not present

## 2017-04-25 DIAGNOSIS — M5136 Other intervertebral disc degeneration, lumbar region: Secondary | ICD-10-CM | POA: Diagnosis not present

## 2017-05-07 ENCOUNTER — Other Ambulatory Visit: Payer: Self-pay | Admitting: Family Medicine

## 2017-05-10 ENCOUNTER — Ambulatory Visit (INDEPENDENT_AMBULATORY_CARE_PROVIDER_SITE_OTHER): Payer: PPO | Admitting: Neurology

## 2017-05-10 ENCOUNTER — Encounter (INDEPENDENT_AMBULATORY_CARE_PROVIDER_SITE_OTHER): Payer: Self-pay

## 2017-05-10 ENCOUNTER — Encounter: Payer: Self-pay | Admitting: Neurology

## 2017-05-10 VITALS — BP 139/79 | HR 52 | Ht 67.0 in | Wt 259.4 lb

## 2017-05-10 DIAGNOSIS — F0150 Vascular dementia without behavioral disturbance: Secondary | ICD-10-CM | POA: Diagnosis not present

## 2017-05-10 MED ORDER — MEMANTINE HCL ER 7 MG PO CP24: 28.0000 mg | ORAL_CAPSULE | Freq: Every day | ORAL | Status: DC

## 2017-05-10 NOTE — Progress Notes (Signed)
[facility]  PATIENT: Logan FlankEdward C Curnow Jr. 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 He has risk factors of diabetes and hypertension obesity and hyperlipidemia. At the time of his stroke he was found to have a PFO. He elected not to have it closed.  Update 06/10/2014:Logan Valdez, 71 year old male returns for followup. Last seen in this office 2/5/ 2015 Carotid Doppler revealed 50-69% stenosis involving the left proximal ICA in September 2014. He states he is doing well and still has mild short-term memory difficulties which appear to be unchanged. Patient is currently taking Namenda XR 28 mg and he is tolerating without difficulty. His memory is stable .He has no new neurologic complaints, he returns for reevaluation. Due to chronic back pain he gets no exercise. He has not had further stroke TIA symptoms. He remains on Coumadin with minimal bruising.   Update 07/15/2015 PS: He returns for follow-up today complaint by his wife. The patient was unable to afford Namenda XR. 28 mg and hence switched back to Namenda 10 mg twice daily. He is tolerating it well but wife feels subjectively maybe a little bit worse. He continues to have memory and cognitive difficulties but under wife's supervision he does fine. He is mainly bothered by severe low back pain but has not been considered a surgical candidate. He has limited mobility and does not exercise a lot but has lost some weight approximately 13 pounds. He goes to pain management He remains on warfarin which is tolerating well with only minor bruising and no significant bleeding episodes. He had carotid ultrasound done on 07/15/14 show 50-69% left ICA stenosis. He has had no recurrent stroke or TIA symptoms. He is  tolerating Lipitor well without any side effects. Last lipid profile was in February . 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 He has risk factors of diabetes and hypertension obesity and hyperlipidemia. He also has a vascular dementia and is currently on Namenda twice daily. His memory is stable. He continues to go to pain management. He is on warfarin. Last carotid ultrasound in September 2015 shows 50-69% left ICA stenosis. He needs repeat. He has not had recurrent stroke or TIA symptoms. Blood  pressure in good control 120/84 in the  office. Lipids are followed by his primary care and he remains on Lipitor. No falls since last seen. He returns for reevaluation UPDATE 9/8/17CM Mr Virgia Valdez, 71 year old male returns for follow-up with his history of stroke event in October 2009 resulting in vascular dementia and is currently on Namenda twice daily. His memory is stable. He has risk factors of diabetes and hypertension obesity and hyperlipidemia. He also has chronic back pain and goes to pain management. Repeat carotid Doppler after last visit in March was unchanged from previous Doppler in September 2015. He has not had recurrent stroke or TIA symptoms. He is presently on Coumadin. Minimal bruising noted on his hands and forearms. Blood pressure mildly elevated in the office today at 144/72. He remains on Lipitor for hyperlipidemia. He denies any myalgias or muscle pain. His labs are followed by primary care. He has not fallen. He returns for reevaluation Update 05/10/2017 : He  returns for follow-up after last visit 9 months ago. He is accompanied by his wife. He was hospitalized recently with episode of confusion and was found to have fever and upper respiratory tract infection. He is also found to have COPD exacerbation was treated with  antibiotics and steroids. He has improved though he still has some mild occasional nocturnal cough. The wife reports that he is had further cognitive worsening and requires more help with activities of daily living. His Mini-Mental status score testing today scored 13/30 which is lower than 18/30 at last visit. Is currently on Namenda 10 mg twice daily and is tolerating well. He has never tried a higher dose of Namenda yet. REVIEW OF SYSTEMS: Full 14 system review of systems performed and notable only for those listed, all others are neg:   Confusion, anxiety, nauseous, hallucinations, moles come back pain all other systems negative  ALLERGIES: Allergies  Allergen Reactions  . Hydrochlorothiazide Other (See Comments)    Mental changes   . Indomethacin     syncope  . Pravastatin Sodium     REACTION: rash 11/09  . Lisinopril     REACTION: HA  . Micardis [Telmisartan]     REACTION: HA    HOME MEDICATIONS: Outpatient Medications Prior to Visit  Medication Sig Dispense Refill  . acetaminophen (TYLENOL) 500 MG tablet Take 500 mg by mouth 3 (three) times daily.    Marland Kitchen amLODipine (NORVASC) 2.5 MG tablet Take 1 tablet (2.5 mg total) by mouth daily. 30 tablet 6  . atorvastatin (LIPITOR) 20 MG tablet TAKE 1 TABLET BY MOUTH DAILY 30 tablet 11  . Cyanocobalamin (VITAMIN B-12 PO) Take 2 tablets by mouth 2 (two) times daily.    Marland Kitchen dextromethorphan (DELSYM) 30 MG/5ML liquid Take 15-60 mg by mouth 2 (two) times daily as needed for cough.    . docusate sodium (COLACE) 100 MG capsule Take 100 mg by mouth daily as needed for mild constipation.    . finasteride (PROSCAR) 5 MG tablet TAKE 1 TABLET BY MOUTH DAILY 30 tablet 6  . FOLIC ACID PO Take 3 tablets by mouth 2 (two) times daily.    Marland Kitchen gabapentin (NEURONTIN) 300 MG capsule TAKE ONE CAPSULE BY MOUTH THREE TIMES DAILY 270 capsule 3  . guaiFENesin (MUCINEX) 600 MG 12 hr tablet Take 600-1,200 mg by mouth 2 (two) times daily as needed for cough or to loosen  phlegm.    Marland Kitchen HYDROcodone-acetaminophen (NORCO) 10-325 MG per tablet Take 1 tablet by mouth every 6 (six) hours as needed for moderate pain.     Marland Kitchen ipratropium-albuterol (DUONEB) 0.5-2.5 (3) MG/3ML SOLN Take 3 mLs by nebulization every 2 (two) hours as needed. 360 mL 1  . metoprolol tartrate (LOPRESSOR) 25 MG tablet Take 0.5 tablets (12.5 mg total) by mouth 2 (two) times daily.    . polyethylene glycol powder (GLYCOLAX/MIRALAX) powder DISSOLVE ONE (1) CAPFUL (17GM) INTO 4 TO8 OUNCES OF FLUID AND TAKE BYMOUTH ONCE DAILY AS DIRECTED 255 g 3  . Pyridoxine HCl (VITAMIN B-6 PO) Take 1 tablet by mouth 2 (two) times daily.    Marland Kitchen triamcinolone cream (KENALOG) 0.1 % Apply 1 application topically 2 (two) times daily. Apply to AA. (Patient taking differently: Apply 1 application topically 2 (two) times daily as needed (rash). Apply to AA.) 30 g 0  . warfarin (COUMADIN) 5 MG tablet TAKE AS DIRECTED BY COUMADIN CLINIC 90 tablet 0  . memantine (NAMENDA) 10 MG tablet TAKE 1 TABLET BY MOUTH TWICE A DAY 180  tablet 3   No facility-administered medications prior to visit.     PAST MEDICAL HISTORY: Past Medical History:  Diagnosis Date  . Chronic lower back pain    [place]  . DDD (degenerative disc disease), lumbar   . Dysmetabolic syndrome X   . Gout, unspecified   . History of DVT (deep vein thrombosis)   . History of pulmonary embolism   . History of stroke 2009   [facility]  . History of thrombophlebitis   . HTN (hypertension)   . Memory loss   . Mixed hyperlipidemia   . Obesity   . PFO (patent foramen ovale)   . Primary hypercoagulable state (HCC) 05/14/2008   Personal h/o DVT, PE, PFO, and lupus AC +   . Stroke (HCC)   . Type II or unspecified type diabetes mellitus without mention of complication, uncontrolled   . Unspecified sleep apnea     PAST SURGICAL HISTORY: Past Surgical History:  Procedure Laterality Date  . COLONOSCOPY  2000   [facility]  . WRIST SURGERY     Right wrist repair post trauma     FAMILY HISTORY: Family History  Problem Relation Age of Onset  . Emphysema Father   . Diabetes Brother   . Hypertension Brother   . Heart attack Brother 43  . Skin cancer Brother   . Lung cancer Brother   . Cancer Maternal Aunt        ? Type   . Stroke Neg Hx     SOCIAL HISTORY: Social History   Social History  . Marital status: Married    Spouse name: N/A  . Number of children: 4  . Years of education: 8 th   Occupational History  . Not on file.   Social History Main Topics  . Smoking status: Never Smoker  . Smokeless tobacco: Never Used  . Alcohol use No  . Drug use: No  . Sexual activity: Not on file   Other Topics Concern  . Not on file   Social History Narrative   Patient is married with 4 children.   Patient is right handed.   Patient has 8 th grade education.   Patient drinks 4-5 cups daily.     PHYSICAL EXAM  Vitals:   05/10/17 1504  BP: 139/79  Pulse: (!) 52  Weight: 259 lb 6.4 oz (117.7 kg)  Height: 5\' 7"  (1.702 m)   Body mass index is 40.63 kg/m. Generalized: Well developed, obese Caucasian male  in no acute distress  Head: normocephalic and atraumatic,.  Neck: Supple, no carotid bruits  Cardiac: Regular rate rhythm, no murmur  Musculoskeletal: No deformity  Neurological examination  Mentation: Alert oriented to time, place, history taking. MMSE 13/30 ( last visit16/30 ).  missing items in orientation, calculation , unable to write a sentence or copy a figure. AFT 6. Follows all commands speech and language fluent  Cranial nerve II-XII: Pupils were equal round reactive to light extraocular movements were full, visual field show partial right homonymous hemianopsia on confrontational test. Facial sensation and strength were normal. Hard of hearing. Uvula tongue midline. head turning and shoulder shrug were normal and symmetric.Tongue protrusion into cheek strength was normal.  Motor: normal bulk and tone, full strength in the BUE, BLE,  fine finger movements normal, no pronator drift. No focal weakness  Sensory: normal and symmetric to light touch, pinprick, and vibration in the upper and lower extremities Coordination: finger-nose-finger, heel-to-shin bilaterally, no dysmetria  Reflexes: 1+ upper lower  and symmetric  Gait and Station: Rising up from seated position without assistance, normal stance, moderate stride, good arm swing, smooth turning, able to perform tiptoe, and heel walking without difficulty. Tandem gait is steady no assistive device   DIAGNOSTIC DATA (LABS, IMAGING, TESTING) - I reviewed patient records, labs, notes, testing and imaging myself where available.  Lab Results  Component Value Date   WBC 10.8 (H) 02/19/2017   HGB 13.0 02/19/2017   HCT 39.7 02/19/2017   MCV 91.3 02/19/2017   PLT 193 02/19/2017      Component Value Date/Time   NA 139 02/19/2017 0415   K 4.1 02/19/2017 0415   CL 101 02/19/2017 0415   CO2 25 02/19/2017 0415   GLUCOSE 174 (H) 02/19/2017 0415   BUN 18 02/19/2017 0415   CREATININE 1.01 02/19/2017 0415   CALCIUM 8.8 (L) 02/19/2017 0415   PROT 6.8 02/15/2017 2315   ALBUMIN 3.9 02/15/2017 2315   AST 30 02/15/2017 2315   ALT 21 02/15/2017 2315   ALKPHOS 60 02/15/2017 2315   BILITOT 1.6 (H) 02/15/2017 2315   GFRNONAA >60 02/19/2017 0415   GFRAA >60 02/19/2017 0415        ASSESSMENT AND PLAN 71 y.o. year old male has a past medical history of Obesity; Dysmetabolic syndrome X; Unspecified sleep apnea; Mixed hyperlipidemia; Type II or unspecified type diabetes mellitus without mention of complication, uncontrolled; History of pulmonary embolism; History of DVT (deep vein thrombosis); History of thrombophlebitis; HTN (hypertension); History of stroke (2009); PFO (patent foramen ovale); Chronic lower back pain; DDD (degenerative disc disease), lumbar; and Primary hypercoagulable state (05/14/2008). here to follow-up.The patient is a current patient of Dr. [name] who is out of  the office today . This note is sent to the work in doctor.    PLAN:I had a long discussion the patient and his wife regarding his vascular dementia and mild parkinsonian symptoms and answered questions. I recommend increased dose of Namenda to XR 28 mg daily to see if tolerated. Continue warfarin and strict control of hypertension with blood pressure goal below 130/90 and lipids with LDL cholesterol goal below 70 mg percent. He'll return for follow-up in 3 months with mypractitioner call earlier if necessary  Delia Heady, MD [facility] [address], [address] [place], [place] [address] [phone]  Personally have participated in and made any corrections needed to history, physical, neuro exam,assessment and plan as stated above.  I have personally discussed with NP, evaluated lab date, reviewed imaging studies and agree with radiology interpretations.    Naomie Dean, MD [facility]

## 2017-05-10 NOTE — Patient Instructions (Signed)
I had a long discussion the patient and his wife regarding his vascular dementia and mild parkinsonian symptoms and answered questions. I recommend increased dose of Namenda to XR 28 mg daily to see if tolerated. Continue warfarin and strict control of hypertension with blood pressure goal below 130/90 and lipids with LDL cholesterol goal below 70 mg percent. He'll return for follow-up in 3 months with mypractitioner call earlier if necessary

## 2017-05-22 ENCOUNTER — Ambulatory Visit (INDEPENDENT_AMBULATORY_CARE_PROVIDER_SITE_OTHER): Payer: PPO

## 2017-05-22 DIAGNOSIS — Z5181 Encounter for therapeutic drug level monitoring: Secondary | ICD-10-CM | POA: Diagnosis not present

## 2017-05-22 LAB — POCT INR: INR: 2.8

## 2017-05-22 NOTE — Patient Instructions (Signed)
Pre visit review using our clinic review tool, if applicable. No additional management support is needed unless otherwise documented below in the visit note. 

## 2017-06-19 ENCOUNTER — Ambulatory Visit (INDEPENDENT_AMBULATORY_CARE_PROVIDER_SITE_OTHER): Payer: PPO

## 2017-06-19 DIAGNOSIS — Z5181 Encounter for therapeutic drug level monitoring: Secondary | ICD-10-CM

## 2017-06-19 LAB — POCT INR: INR: 2.4

## 2017-06-19 NOTE — Patient Instructions (Signed)
Pre visit review using our clinic review tool, if applicable. No additional management support is needed unless otherwise documented below in the visit note. 

## 2017-06-25 ENCOUNTER — Other Ambulatory Visit: Payer: Self-pay | Admitting: Family Medicine

## 2017-06-26 ENCOUNTER — Ambulatory Visit (INDEPENDENT_AMBULATORY_CARE_PROVIDER_SITE_OTHER): Payer: PPO

## 2017-06-26 DIAGNOSIS — Z5181 Encounter for therapeutic drug level monitoring: Secondary | ICD-10-CM

## 2017-06-26 LAB — POCT INR: INR: 2.4

## 2017-06-26 NOTE — Patient Instructions (Signed)
Pre visit review using our clinic review tool, if applicable. No additional management support is needed unless otherwise documented below in the visit note. 

## 2017-07-17 ENCOUNTER — Encounter: Payer: Self-pay | Admitting: Primary Care

## 2017-07-17 ENCOUNTER — Ambulatory Visit (INDEPENDENT_AMBULATORY_CARE_PROVIDER_SITE_OTHER): Payer: PPO

## 2017-07-17 ENCOUNTER — Ambulatory Visit (INDEPENDENT_AMBULATORY_CARE_PROVIDER_SITE_OTHER): Payer: PPO | Admitting: Primary Care

## 2017-07-17 ENCOUNTER — Telehealth: Payer: Self-pay | Admitting: Family Medicine

## 2017-07-17 VITALS — BP 126/72 | HR 49 | Temp 98.2°F | Ht 67.0 in | Wt 249.8 lb

## 2017-07-17 DIAGNOSIS — R42 Dizziness and giddiness: Secondary | ICD-10-CM

## 2017-07-17 DIAGNOSIS — Z5181 Encounter for therapeutic drug level monitoring: Secondary | ICD-10-CM

## 2017-07-17 LAB — POC URINALSYSI DIPSTICK (AUTOMATED)
Bilirubin, UA: NEGATIVE
Blood, UA: NEGATIVE
GLUCOSE UA: NEGATIVE
Ketones, UA: NEGATIVE
LEUKOCYTES UA: NEGATIVE
Nitrite, UA: NEGATIVE
PROTEIN UA: NEGATIVE
SPEC GRAV UA: 1.025 (ref 1.010–1.025)
UROBILINOGEN UA: 0.2 U/dL
pH, UA: 6 (ref 5.0–8.0)

## 2017-07-17 LAB — POCT INR: INR: 2.8

## 2017-07-17 NOTE — Telephone Encounter (Signed)
Pt has appt 07/17/17 at 3:45 with Mayra ReelKate Clark NP.

## 2017-07-17 NOTE — Patient Instructions (Signed)
Pre visit review using our clinic review tool, if applicable. No additional management support is needed unless otherwise documented below in the visit note. 

## 2017-07-17 NOTE — Telephone Encounter (Signed)
Patient Name: Logan Valdez  DOB: Apr 28, 1946    Initial Comment Caller states her uncle fell into a desk, he got up and fell again. Doesn't appear to have any broken bones and had an assessment by the fire department and they said he is fine. Her main concern is that he fell twice in one day because it is very uncommon, wanting to get an appointment to assess what might be going on    Nurse Assessment  Nurse: Logan GaulStringer, Logan Valdez, Logan Valdez Date/Time Logan Valdez(Eastern Time): 07/17/2017 12:49:05 PM  Confirm and document reason for call. If symptomatic, describe symptoms. ---Caller states her uncle fell into a desk earlier today. Did not fall to the floor. Went to get up and fell in the floor. He was in the floor and repairmen found him and they called EMS who said he was fine. he has been dizzy today. Alert and oriented,  Does the patient have any new or worsening symptoms? ---Yes  Will a triage be completed? ---Yes  Related visit to physician within the last 2 weeks? ---No  Does the PT have any chronic conditions? (i.e. diabetes, asthma, etc.) ---Yes  List chronic conditions. ---HTN; CVA  Is this a behavioral health or substance abuse call? ---No     Guidelines    Guideline Title Affirmed Question Affirmed Notes  Dizziness - Lightheadedness Taking a medicine that could cause dizziness (e.g., blood pressure medications, diuretics)    Final Disposition User   See Physician within 24 Hours Stringer, Logan Valdez, Logan Valdez    Comments  Pt has appt at 3 pm for blood work for his Coumadin. appt scheduled for 07/17/2017 at 3:45 pm with Logan Valdez   Referrals  REFERRED TO PCP OFFICE   Disagree/Comply: Danella Maiersomply

## 2017-07-17 NOTE — Patient Instructions (Signed)
Complete lab work prior to leaving today.   Start monitoring your blood pressure and heart reate once daily. Record your readings and notify Dr. Sharen HonesGutierrez if heart rate is below 60 on a regular basis. Please notify if blood pressure is lower than 100/60.  It was a pleasure meeting you! I'll be in touch soon!

## 2017-07-17 NOTE — Progress Notes (Signed)
Subjective:    Patient ID: Logan Valdez., male    DOB: November 18, 1945, 71 y.o.   MRN: [id number]  HPI  [name] is a 71 year old male with a history of hypertension, CVA, COPD, type 2 diabetes, vascular dementia, chronic low back pain who presents today with a chief complaint of dizziness. His wife is with him today providing most of his HPI.   His dizziness began this morning when walking in the hallway. He was walking in hallway this morning, felt dizzy, caught himself on a desk, prevented a fall. He fell a second (unwitnessed) time as the Engineer, materials noted him hollering for help. His wife thinks he may have fallen while getting out of the recliner one hour later. He thinks he landed on his right hip and hit his head on the side of the walker. He is ambulating without his walker at home, does not like to use it much. His   His wife has noticed that he's laying around more, has noticed increased generalized weakness, decreased appetite and is not drinking much water, increased confusion. This has been going on for several weeks. She made one of his favorite dinners last night and he hardly touched his plate. He denies nausea/vomiting, dysuria, foul smelling urine, abdominal pain, cough, hip pain, headaches.   Review of Systems  Constitutional: Positive for appetite change and fatigue. Negative for fever.  Respiratory: Negative for cough and shortness of breath.   Cardiovascular: Negative for chest pain.  Gastrointestinal: Negative for abdominal pain and nausea.  Genitourinary: Negative for dysuria and frequency.  Musculoskeletal:       Denies hip pain  Skin: Negative for color change.  Neurological: Positive for dizziness. Negative for headaches.  Psychiatric/Behavioral:       Increased confusion per wife       Past Medical History:  Diagnosis Date  . Chronic lower back pain    Ramos  . DDD (degenerative disc disease), lumbar   . Dysmetabolic syndrome X   .  Gout, unspecified   . History of DVT (deep vein thrombosis)   . History of pulmonary embolism   . History of stroke 2009   Sethi  . History of thrombophlebitis   . HTN (hypertension)   . Memory loss   . Mixed hyperlipidemia   . Obesity   . PFO (patent foramen ovale)   . Primary hypercoagulable state (HCC) 05/14/2008   Personal h/o DVT, PE, PFO, and lupus AC +   . Stroke (HCC)   . Type II or unspecified type diabetes mellitus without mention of complication, uncontrolled   . Unspecified sleep apnea      Social History   Social History  . Marital status: Married    Spouse name: N/A  . Number of children: 4  . Years of education: 8 th   Occupational History  . Not on file.   Social History Main Topics  . Smoking status: Never Smoker  . Smokeless tobacco: Never Used  . Alcohol use No  . Drug use: No  . Sexual activity: Not on file   Other Topics Concern  . Not on file   Social History Narrative   Patient is married with 4 children.   Patient is right handed.   Patient has 8 th grade education.   Patient drinks 4-5 cups daily.    Past Surgical History:  Procedure Laterality Date  . COLONOSCOPY  2000   [facility]  . WRIST  SURGERY     Right wrist repair post trauma    Family History  Problem Relation Age of Onset  . Emphysema Father   . Diabetes Brother   . Hypertension Brother   . Heart attack Brother 43  . Skin cancer Brother   . Lung cancer Brother   . Cancer Maternal Aunt        ? Type   . Stroke Neg Hx     Allergies  Allergen Reactions  . Hydrochlorothiazide Other (See Comments)    Mental changes   . Indomethacin     syncope  . Pravastatin Sodium     REACTION: rash 11/09  . Lisinopril     REACTION: HA  . Micardis [Telmisartan]     REACTION: HA    Current Outpatient Prescriptions on File Prior to Visit  Medication Sig Dispense Refill  . acetaminophen (TYLENOL) 500 MG tablet Take 500 mg by mouth 3 (three) times daily.    Marland Kitchen. amLODipine  (NORVASC) 2.5 MG tablet Take 1 tablet (2.5 mg total) by mouth daily. 30 tablet 6  . atorvastatin (LIPITOR) 20 MG tablet TAKE 1 TABLET BY MOUTH DAILY 30 tablet 11  . Cyanocobalamin (VITAMIN B-12 PO) Take 2 tablets by mouth 2 (two) times daily.    Marland Kitchen. dextromethorphan (DELSYM) 30 MG/5ML liquid Take 15-60 mg by mouth 2 (two) times daily as needed for cough.    . docusate sodium (COLACE) 100 MG capsule Take 100 mg by mouth daily as needed for mild constipation.    . finasteride (PROSCAR) 5 MG tablet TAKE 1 TABLET BY MOUTH DAILY 30 tablet 0  . FOLIC ACID PO Take 3 tablets by mouth 2 (two) times daily.    Marland Kitchen. gabapentin (NEURONTIN) 300 MG capsule TAKE ONE CAPSULE BY MOUTH THREE TIMES DAILY 270 capsule 3  . guaiFENesin (MUCINEX) 600 MG 12 hr tablet Take 600-1,200 mg by mouth 2 (two) times daily as needed for cough or to loosen phlegm.    Marland Kitchen. HYDROcodone-acetaminophen (NORCO) 10-325 MG per tablet Take 1 tablet by mouth every 6 (six) hours as needed for moderate pain.     Marland Kitchen. ipratropium-albuterol (DUONEB) 0.5-2.5 (3) MG/3ML SOLN Take 3 mLs by nebulization every 2 (two) hours as needed. 360 mL 1  . metoprolol tartrate (LOPRESSOR) 25 MG tablet Take 0.5 tablets (12.5 mg total) by mouth 2 (two) times daily.    . polyethylene glycol powder (GLYCOLAX/MIRALAX) powder DISSOLVE ONE (1) CAPFUL (17GM) INTO 4 TO8 OUNCES OF FLUID AND TAKE BYMOUTH ONCE DAILY AS DIRECTED 255 g 3  . Pyridoxine HCl (VITAMIN B-6 PO) Take 1 tablet by mouth 2 (two) times daily.    Marland Kitchen. triamcinolone cream (KENALOG) 0.1 % Apply 1 application topically 2 (two) times daily. Apply to AA. (Patient taking differently: Apply 1 application topically 2 (two) times daily as needed (rash). Apply to AA.) 30 g 0  . warfarin (COUMADIN) 5 MG tablet TAKE AS DIRECTED BY COUMADIN CLINIC 90 tablet 0  . [DISCONTINUED] metoprolol succinate (TOPROL-XL) 100 MG 24 hr tablet 1/2 by mouth daily 45 tablet 1   Current Facility-Administered Medications on File Prior to Visit    Medication Dose Route Frequency Provider Last Rate Last Dose  . memantine (NAMENDA XR) 24 hr capsule 28 mg  28 mg Oral Daily Sethi, Pramod S, MD        BP 126/72   Pulse (!) 49   Temp 98.2 F (36.8 C) (Oral)   Ht 5\' 7"  (1.702 m)   Wt  249 lb 12.8 oz (113.3 kg)   SpO2 98%   BMI 39.12 kg/m    Objective:   Physical Exam  Constitutional: He appears well-nourished.  Neck: Neck supple.  Cardiovascular: Regular rhythm.   Murmur heard. Sinus bradycardia in low 50's.   Pulmonary/Chest: Effort normal and breath sounds normal.  Abdominal: Soft. Bowel sounds are normal. There is no tenderness.  Musculoskeletal:       Right hip: He exhibits normal range of motion, normal strength, no bony tenderness and no deformity.  Neurological: He is alert.  Follows most commands, poor historian overall  Skin: Skin is warm and dry.  No bruising or abrasions          Assessment & Plan:  Dizziness:  2 episodes today, one near fall, 1 fall. Heart rate in office today noted to be between 49 and 52. Regular rhythm. No change in heart rate with laying to sitting, sitting to standing. No obvious external injuries, ambulates without difficulty. Check CBC, CMP, TSH given history of decreased appetite, increased confusion. Urinalysis today unremarkable. Suspect dizziness could be secondary to bradycardia. Will have wife measure blood pressure and heart rate daily, report readings of heart rate less than 60, blood pressure less than 100/60.  Will consult with PCP to consider reduction of metoprolol.  Morrie Sheldon, NP

## 2017-07-17 NOTE — Telephone Encounter (Signed)
Noted and evaluated. 

## 2017-07-18 LAB — COMPREHENSIVE METABOLIC PANEL
ALK PHOS: 57 U/L (ref 39–117)
ALT: 18 U/L (ref 0–53)
AST: 22 U/L (ref 0–37)
Albumin: 4 g/dL (ref 3.5–5.2)
BILIRUBIN TOTAL: 0.6 mg/dL (ref 0.2–1.2)
BUN: 11 mg/dL (ref 6–23)
CO2: 28 mEq/L (ref 19–32)
CREATININE: 0.99 mg/dL (ref 0.40–1.50)
Calcium: 9.2 mg/dL (ref 8.4–10.5)
Chloride: 102 mEq/L (ref 96–112)
GFR: 79.01 mL/min (ref >60.00)
GLUCOSE: 114 mg/dL — AB (ref 70–99)
Potassium: 4.3 mEq/L (ref 3.5–5.1)
SODIUM: 140 meq/L (ref 135–145)
TOTAL PROTEIN: 6.5 g/dL (ref 6.0–8.3)

## 2017-07-18 LAB — CBC WITH DIFFERENTIAL/PLATELET
BASOS ABS: 0.1 10*3/uL (ref 0.0–0.1)
Basophils Relative: 1.2 % (ref 0.0–3.0)
Eosinophils Absolute: 0.1 10*3/uL (ref 0.0–0.7)
Eosinophils Relative: 1 % (ref 0.0–5.0)
HCT: 42.4 % (ref 39.0–52.0)
Hemoglobin: 13.9 g/dL (ref 13.0–17.0)
LYMPHS ABS: 1.8 10*3/uL (ref 0.7–4.0)
Lymphocytes Relative: 20.2 % (ref 12.0–46.0)
MCHC: 32.8 g/dL (ref 30.0–36.0)
MCV: 92 fl (ref 78.0–100.0)
MONO ABS: 0.9 10*3/uL (ref 0.1–1.0)
MONOS PCT: 10.1 % (ref 3.0–12.0)
NEUTROS ABS: 6.1 10*3/uL (ref 1.4–7.7)
NEUTROS PCT: 67.5 % (ref 43.0–77.0)
PLATELETS: 190 10*3/uL (ref 150.0–400.0)
RBC: 4.61 Mil/uL (ref 4.22–5.81)
RDW: 13.1 % (ref 11.5–15.5)
WBC: 9.1 10*3/uL (ref 4.0–10.5)

## 2017-07-18 LAB — TSH: TSH: 1.03 u[IU]/mL (ref 0.35–4.50)

## 2017-08-06 ENCOUNTER — Other Ambulatory Visit: Payer: Self-pay | Admitting: Family Medicine

## 2017-08-07 NOTE — Telephone Encounter (Signed)
Patient and wife are compliant with coumadin management.  Will refill X 6 months.

## 2017-08-14 ENCOUNTER — Ambulatory Visit: Payer: PPO

## 2017-08-22 ENCOUNTER — Encounter: Payer: Self-pay | Admitting: Nurse Practitioner

## 2017-08-22 ENCOUNTER — Ambulatory Visit (INDEPENDENT_AMBULATORY_CARE_PROVIDER_SITE_OTHER): Payer: PPO | Admitting: Nurse Practitioner

## 2017-08-22 ENCOUNTER — Encounter (INDEPENDENT_AMBULATORY_CARE_PROVIDER_SITE_OTHER): Payer: Self-pay

## 2017-08-22 VITALS — BP 162/72 | HR 59 | Ht 67.0 in | Wt 252.4 lb

## 2017-08-22 DIAGNOSIS — F0150 Vascular dementia without behavioral disturbance: Secondary | ICD-10-CM

## 2017-08-22 DIAGNOSIS — E782 Mixed hyperlipidemia: Secondary | ICD-10-CM

## 2017-08-22 DIAGNOSIS — Z8673 Personal history of transient ischemic attack (TIA), and cerebral infarction without residual deficits: Secondary | ICD-10-CM | POA: Diagnosis not present

## 2017-08-22 MED ORDER — MEMANTINE HCL ER 28 MG PO CP24: 28.0000 mg | ORAL_CAPSULE | Freq: Every day | ORAL | 6 refills | Status: DC

## 2017-08-22 NOTE — Patient Instructions (Signed)
Increase Namenda to 28 mg daily XR  Continue warfarin  for secondary stroke prevention  Strict control of hypertension with blood pressure goal below 130/90 todays reading 162/72 lipids with LDL cholesterol goal below 70 mg percent. Continue Lipitor Follow up in 6 months for repeat MMSE

## 2017-08-22 NOTE — Progress Notes (Signed)
[facility]  PATIENT: Logan Valdez. DOB: 02-06-1946   REASON FOR VISIT: Follow-up for dementia and history of stroke with risk factors of diabetes hypertension hyperlipidemia. 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 He has risk factors of diabetes and hypertension obesity and hyperlipidemia. At the time of his stroke he was found to have a PFO. He elected not to have it closed.   Update 07/15/2015 PS: He returns for follow-up today complaint by his wife. The patient was unable to afford Namenda XR. 28 mg and hence switched back to Namenda 10 mg twice daily. He is tolerating it well but wife feels subjectively maybe a little bit worse. He continues to have memory and cognitive difficulties but under wife's supervision he does fine. He is mainly bothered by severe low back pain but has not been considered a surgical candidate. He has limited mobility and does not exercise a lot but has lost some weight approximately 13 pounds. He goes to pain management He remains on warfarin which is tolerating well with only minor bruising and no significant bleeding episodes. He had carotid ultrasound done on 07/15/14 show 50-69% left ICA stenosis. He has had no recurrent stroke or TIA symptoms. He is tolerating Lipitor well without any side effects. Last lipid profile was in February . Blood pressure is mildly elevated in the office today 142/78 UPDATE 3/8/17[name]. Logan Valdez, 71 year old Valdez returns for follow-up.He has a history of left posterior cerebral artery infarct in October 2009. He has risk factors of diabetes and hypertension obesity and hyperlipidemia. He also has a vascular dementia and is currently on Namenda twice daily. His memory is stable. He continues to go to pain management. He is on warfarin. Last carotid ultrasound in September 2015 shows 50-69% left ICA stenosis. He  needs repeat. He has not had recurrent stroke or TIA symptoms. Blood  pressure in good control 120/84 in the  office. Lipids are followed by his primary care and he remains on Lipitor. No falls since last seen. He returns for reevaluation UPDATE 9/8/17[name] Valdez, 71 year old Valdez returns for follow-up with his history of stroke event in October 2009 resulting in vascular dementia and is currently on Namenda twice daily. His memory is stable. He has risk factors of diabetes and hypertension obesity and hyperlipidemia. He also has chronic back pain and goes to pain management. Repeat carotid Doppler after last visit in March was unchanged from previous Doppler in September 2015. He has not had recurrent stroke or TIA symptoms. He is presently on Coumadin. Minimal bruising noted on his hands and forearms. Blood pressure mildly elevated in the office today at 144/72. He remains on Lipitor for hyperlipidemia. He denies any myalgias or muscle pain. His labs are followed by primary care. He has not fallen. He returns for reevaluation Update 7/5/2018PS : He returns for follow-up after last visit 9 months ago. He is accompanied by his wife. He was hospitalized recently with episode of confusion and was found to have fever and upper respiratory tract infection. He is also found to have COPD exacerbation was treated with antibiotics and steroids. He has improved though he still has some mild occasional nocturnal cough. The wife reports that he is had further cognitive worsening and requires more help with activities of daily living. His Mini-Mental status score testing today scored 13/30 which is lower than 18/30 at last visit. Is currently  on Namenda 10 mg twice daily and is tolerating well. He has never tried a higher dose of Namenda yet. UPDATE 10/17/2018[name]. Logan Valdez, 71 year old Valdez returns for follow-up history of stroke event in 2009 resulting in vascular dementia. He is currently on Coumadin for secondary  stroke prevention without further stroke or TIA symptoms. He had hospitalization in June for upper respiratory tract infection memory worsened after that time. Wife feels it is stable today. He continues to require more assistance with ADLs. MMSE today is 13 out of 30. He was ordered Namenda extended release at his last visit however it appears that was never called in by Dr. [name]. Patient remains on Namenda 10 mg twice daily. Blood pressure the office today 162/72. He remains on Lipitor without myalgias. He has a long history of back pain and gets little exercise. Labs continue to be followed by his primary care provider. Recent CBC and CMP reviewed from 07/17/17. Due to complaints of dizziness his Metoprolol  was decreased to half tablet daily and his gabapentin was decreased to twice a day by PCP Dr. Sharen Hones. He returns for reevaluation  REVIEW OF SYSTEMS: Full 14 system review of systems performed and notable only for those listed, all others are neg:  Constitutional: neg  Cardiovascular: neg Ear/Nose/Throat: neg  Skin: neg Eyes: neg Respiratory: neg Gastroitestinal: neg  Hematology/Lymphatic: neg  Endocrine: neg Musculoskeletal:neg Allergy/Immunology: neg Neurological: Memory loss Psychiatric: neg Sleep : neg   ALLERGIES: Allergies  Allergen Reactions  . Hydrochlorothiazide Other (See Comments)    Mental changes   . Indomethacin     syncope  . Pravastatin Sodium     REACTION: rash 11/09  . Lisinopril     REACTION: HA  . Micardis [Telmisartan]     REACTION: HA    HOME MEDICATIONS: Outpatient Medications Prior to Visit  Medication Sig Dispense Refill  . acetaminophen (TYLENOL) 500 MG tablet Take 500 mg by mouth 3 (three) times daily.    Marland Kitchen amLODipine (NORVASC) 2.5 MG tablet Take 1 tablet (2.5 mg total) by mouth daily. 30 tablet 6  . atorvastatin (LIPITOR) 20 MG tablet TAKE 1 TABLET BY MOUTH DAILY 30 tablet 11  . Cyanocobalamin (VITAMIN B-12 PO) Take 2 tablets by mouth 2  (two) times daily.    Marland Kitchen dextromethorphan (DELSYM) 30 MG/5ML liquid Take 15-60 mg by mouth 2 (two) times daily as needed for cough.    . docusate sodium (COLACE) 100 MG capsule Take 100 mg by mouth daily as needed for mild constipation.    . finasteride (PROSCAR) 5 MG tablet TAKE 1 TABLET BY MOUTH DAILY 30 tablet 2  . FOLIC ACID PO Take 3 tablets by mouth 2 (two) times daily.    Marland Kitchen gabapentin (NEURONTIN) 300 MG capsule TAKE ONE CAPSULE BY MOUTH THREE TIMES DAILY 270 capsule 3  . guaiFENesin (MUCINEX) 600 MG 12 hr tablet Take 600-1,200 mg by mouth 2 (two) times daily as needed for cough or to loosen phlegm.    Marland Kitchen HYDROcodone-acetaminophen (NORCO) 10-325 MG per tablet Take 1 tablet by mouth every 6 (six) hours as needed for moderate pain.     Marland Kitchen ipratropium-albuterol (DUONEB) 0.5-2.5 (3) MG/3ML SOLN Take 3 mLs by nebulization every 2 (two) hours as needed. 360 mL 1  . metoprolol tartrate (LOPRESSOR) 25 MG tablet Take 0.5 tablets (12.5 mg total) by mouth 2 (two) times daily.    . polyethylene glycol powder (GLYCOLAX/MIRALAX) powder DISSOLVE ONE (1) CAPFUL (17GM) INTO 4 TO8 OUNCES OF FLUID AND TAKE  BYMOUTH ONCE DAILY AS DIRECTED 255 g 3  . Pyridoxine HCl (VITAMIN B-6 PO) Take 1 tablet by mouth 2 (two) times daily.    Marland Kitchen. triamcinolone cream (KENALOG) 0.1 % Apply 1 application topically 2 (two) times daily. Apply to AA. (Patient taking differently: Apply 1 application topically 2 (two) times daily as needed (rash). Apply to AA.) 30 g 0  . warfarin (COUMADIN) 5 MG tablet TAKE AS DIRECTED BY THE COUMADIN CLINIC 90 tablet 1   Facility-Administered Medications Prior to Visit  Medication Dose Route Frequency Provider Last Rate Last Dose  . memantine (NAMENDA XR) Logan Valdez hr capsule 28 mg  28 mg Oral Daily Micki RileySethi, Pramod S, MD        PAST MEDICAL HISTORY: Past Medical History:  Diagnosis Date  . Chronic lower back pain    Ramos  . DDD (degenerative disc disease), lumbar   . Dysmetabolic syndrome X   . Gout,  unspecified   . History of DVT (deep vein thrombosis)   . History of pulmonary embolism   . History of stroke 2009   Sethi  . History of thrombophlebitis   . HTN (hypertension)   . Memory loss   . Mixed hyperlipidemia   . Obesity   . PFO (patent foramen ovale)   . Primary hypercoagulable state (HCC) 05/14/2008   Personal h/o DVT, PE, PFO, and lupus AC +   . Stroke (HCC)   . Type II or unspecified type diabetes mellitus without mention of complication, uncontrolled   . Unspecified sleep apnea     PAST SURGICAL HISTORY: Past Surgical History:  Procedure Laterality Date  . COLONOSCOPY  2000   [facility]  . WRIST SURGERY     Right wrist repair post trauma    FAMILY HISTORY: Family History  Problem Relation Age of Onset  . Emphysema Father   . Diabetes Brother   . Hypertension Brother   . Heart attack Brother 43  . Skin cancer Brother   . Lung cancer Brother   . Cancer Maternal Aunt        ? Type   . Stroke Neg Hx     SOCIAL HISTORY: Social History   Social History  . Marital status: Married    Spouse name: N/A  . Number of children: 4  . Years of education: 8 th   Occupational History  . Not on file.   Social History Main Topics  . Smoking status: Never Smoker  . Smokeless tobacco: Never Used  . Alcohol use No  . Drug use: No  . Sexual activity: Not on file   Other Topics Concern  . Not on file   Social History Narrative   Patient is married with 4 children.   Patient is right handed.   Patient has 8 th grade education.   Patient drinks 4-5 cups daily.     PHYSICAL EXAM  Vitals:   08/22/17 1316  BP: (!) 162/72  Pulse: (!) Logan  Weight: 252 lb 6.4 oz (114.5 kg)  Height: 5\' 7"  (1.702 m)   Body mass index is 39.53 kg/m.  Generalized: Well developed, Obese Valdez in no acute distress  Head: normocephalic and atraumatic,. Oropharynx benign  Neck: Supple, no carotid bruits  Cardiac: Regular rate rhythm, no murmur  Musculoskeletal: No deformity    Neurological examination   Mentation: Alert , AFT 6unable to draw a clock MMSE - Mini Mental State Exam 08/22/2017 05/10/2017 07/14/2016  Orientation to time 4 0 1  Orientation  to Place 2 4 3   Registration 3 3 3   Attention/ Calculation 0 0 0  Recall 0 0 2  Language- name 2 objects 1 2 2   Language- repeat 1 1 1   Language- follow 3 step command 2 3 3   Language- read & follow direction 0 0 0  Write a sentence 0 0 0  Copy design 0 0 0  Total score 13 13 15    Follows most  commands speech and language fluent.   Cranial nerve II-XII: Pupils were equal round reactive to light extraocular movements were full, visual field were full on confrontational test. Facial sensation and strength were normal. hearing was intact to finger rubbing bilaterally. Uvula tongue midline. head turning and shoulder shrug were normal and symmetric.Tongue protrusion into cheek strength was normal. Motor: normal bulk and tone, full strength in the BUE, BLE, fine finger movements normal, no pronator drift. No focal weakness Sensory: normal and symmetric to light touch, pinprick, and  Vibration,  Coordination: finger-nose-finger, heel-to-shin bilaterally, no dysmetria Reflexes: Symmetric upper and lower plantar responses were flexor bilaterally. Gait and Station: Rising up from seated position without assistance, wide based steady gait. No assistive device  DIAGNOSTIC DATA (LABS, IMAGING, TESTING) - I reviewed patient records, labs, notes, testing and imaging myself where available.  Lab Results  Component Value Date   WBC 9.1 07/17/2017   HGB 13.9 07/17/2017   HCT 42.4 07/17/2017   MCV 92.0 07/17/2017   PLT 190.0 07/17/2017      Component Value Date/Time   NA 140 07/17/2017 1634   K 4.3 07/17/2017 1634   CL 102 07/17/2017 1634   CO2 28 07/17/2017 1634   GLUCOSE 114 (H) 07/17/2017 1634   BUN 11 07/17/2017 1634   CREATININE 0.99 07/17/2017 1634   CALCIUM 9.2 07/17/2017 1634   PROT 6.5 07/17/2017 1634    ALBUMIN 4.0 07/17/2017 1634   AST 22 07/17/2017 1634   ALT 18 07/17/2017 1634   ALKPHOS 57 07/17/2017 1634   BILITOT 0.6 07/17/2017 1634   GFRNONAA >60 02/19/2017 0415   GFRAA >60 02/19/2017 0415   Lab Results  Component Value Date   CHOL 147 05/31/2016   HDL 49.80 05/31/2016   LDLCALC 75 05/31/2016   LDLDIRECT 155.1 06/19/2014   TRIG 112.0 05/31/2016   CHOLHDL 3 05/31/2016   Lab Results  Component Value Date   HGBA1C 6.3 05/31/2016   Lab Results  Component Value Date   VITAMINB12 >1500 (H) 05/31/2016   Lab Results  Component Value Date   TSH 1.03 07/17/2017      ASSESSMENT AND PLAN 71 y.o. year old Valdez has a past medical history of Obesity; Dysmetabolic syndrome X; Unspecified sleep apnea; Mixed hyperlipidemia; Type II or unspecified type diabetes mellitus without mention of complication, uncontrolled; History of pulmonary embolism; History of DVT (deep vein thrombosis); History of thrombophlebitis; HTN (hypertension); History of stroke (2009); PFO (patent foramen ovale); Chronic lower back pain; DDD (degenerative disc disease), lumbar; and Primary hypercoagulable state (05/14/2008). here to follow-up for memory loss.The patient is a current patient of Dr. [name] who is out of the office today . This note is sent to the work in doctor.    PLAN: Increase Namenda to 28 mg daily XR  Continue warfarin  for secondary stroke prevention  Strict control of hypertension with blood pressure goal below 130/90 todays reading 162/72 lipids with LDL cholesterol goal below 70 mg percent. Continue Lipitor Follow up in 6 months for repeat MMSE I spent 25  min in total face to face time with the patient more than 50% of which was spent counseling and coordination of care, reviewing test results reviewing medications and discussing and reviewing the diagnosis of dementia, it is a progressive disease strategies that may help her a structured environment and routine, avoid  multitasking Nilda Riggs, [facility], [facility], APRN  [facility] [address], [address] [place], [place] [address] [phone]

## 2017-08-22 NOTE — Progress Notes (Signed)
I have read the note, and I agree with the clinical assessment and plan.  Logan Valdez,Logan Valdez   

## 2017-08-24 ENCOUNTER — Ambulatory Visit (INDEPENDENT_AMBULATORY_CARE_PROVIDER_SITE_OTHER): Payer: PPO

## 2017-08-24 DIAGNOSIS — Z5181 Encounter for therapeutic drug level monitoring: Secondary | ICD-10-CM

## 2017-08-24 LAB — POCT INR: INR: 2.6

## 2017-08-24 NOTE — Patient Instructions (Signed)
Pre visit review using our clinic review tool, if applicable. No additional management support is needed unless otherwise documented below in the visit note. 

## 2017-09-14 DIAGNOSIS — M5442 Lumbago with sciatica, left side: Secondary | ICD-10-CM | POA: Diagnosis not present

## 2017-09-14 DIAGNOSIS — G8929 Other chronic pain: Secondary | ICD-10-CM | POA: Diagnosis not present

## 2017-09-14 DIAGNOSIS — G894 Chronic pain syndrome: Secondary | ICD-10-CM | POA: Diagnosis not present

## 2017-09-17 ENCOUNTER — Ambulatory Visit: Payer: PPO

## 2017-09-17 DIAGNOSIS — Z5181 Encounter for therapeutic drug level monitoring: Secondary | ICD-10-CM

## 2017-09-17 LAB — POCT INR: INR: 2.1

## 2017-09-17 NOTE — Patient Instructions (Signed)
  INR today 2.1  Continue to take 1 pill (5mg ) daily EXCEPT for 1/2 pill (2.5mg ) on Mondays and Fridays.  Recheck in 4 weeks.  Patient does not need to hold coumadin prior to dental procedure tomorrow.  I did call and confirm and also report today's INR to Leann at Dr. Mendel CorningBelcher's office[facility] [place]    Will recheck INR in 4 weeks unless there are any changes to patient's medications or he is started on an antibiotic.  Patient's wife to call and let me know if there are any changes.    Wife present, verbalizes understanding of all written instructions given today.

## 2017-09-19 NOTE — Progress Notes (Signed)
Agree, thanks

## 2017-09-21 ENCOUNTER — Other Ambulatory Visit: Payer: Self-pay | Admitting: Family Medicine

## 2017-09-21 ENCOUNTER — Ambulatory Visit: Payer: PPO

## 2017-10-05 ENCOUNTER — Other Ambulatory Visit: Payer: Self-pay | Admitting: Family Medicine

## 2017-10-05 NOTE — Telephone Encounter (Signed)
Last filled:  05/15/17, #[id number] Last OV:  07/17/17 Next OV:  none

## 2017-10-16 ENCOUNTER — Ambulatory Visit: Payer: PPO

## 2017-10-18 ENCOUNTER — Ambulatory Visit: Payer: PPO | Admitting: General Practice

## 2017-10-18 DIAGNOSIS — Z5181 Encounter for therapeutic drug level monitoring: Secondary | ICD-10-CM

## 2017-10-18 DIAGNOSIS — Z7902 Long term (current) use of antithrombotics/antiplatelets: Secondary | ICD-10-CM | POA: Insufficient documentation

## 2017-10-18 LAB — POCT INR: INR: 1.9

## 2017-10-18 NOTE — Patient Instructions (Addendum)
Pre visit review using our clinic review tool, if applicable. No additional management support is needed unless otherwise documented below in the visit note.  Take 1 1/2 tablets today (12/13) and then resume taking 1 tablet daily except 1/2 tablet on Monday and Fridays.  Re-check in 3 to 4 weeks.

## 2017-11-07 ENCOUNTER — Other Ambulatory Visit: Payer: Self-pay | Admitting: Family Medicine

## 2017-11-15 ENCOUNTER — Ambulatory Visit (INDEPENDENT_AMBULATORY_CARE_PROVIDER_SITE_OTHER): Payer: PPO | Admitting: General Practice

## 2017-11-15 DIAGNOSIS — Z7901 Long term (current) use of anticoagulants: Secondary | ICD-10-CM | POA: Insufficient documentation

## 2017-11-15 DIAGNOSIS — Z7902 Long term (current) use of antithrombotics/antiplatelets: Secondary | ICD-10-CM

## 2017-11-15 LAB — POCT INR: INR: 2.4

## 2017-11-15 NOTE — Patient Instructions (Addendum)
Pre visit review using our clinic review tool, if applicable. No additional management support is needed unless otherwise documented below in the visit note.  Continue to take 1 tablet daily except 1/2 tablet on Monday and Fridays.  Re-check in 4 weeks.  

## 2017-11-16 ENCOUNTER — Ambulatory Visit (INDEPENDENT_AMBULATORY_CARE_PROVIDER_SITE_OTHER): Payer: PPO | Admitting: Family Medicine

## 2017-11-16 ENCOUNTER — Encounter: Payer: Self-pay | Admitting: Family Medicine

## 2017-11-16 VITALS — BP 130/78 | HR 69 | Temp 97.9°F | Wt 249.5 lb

## 2017-11-16 DIAGNOSIS — S51812A Laceration without foreign body of left forearm, initial encounter: Secondary | ICD-10-CM | POA: Diagnosis not present

## 2017-11-16 NOTE — Progress Notes (Signed)
Subjective:    Patient ID: Logan Flank., male    DOB: 1946/09/30, 72 y.o.   MRN: [id number]  HPI This is a 72 yo male, accompanied by his wife, who presents today with sore on his left arm x several days. His wife has been applying dressing and ointment. They are not sure if he bumped it or scratched it. He is on coumadin and bleeds easily. Last INR 2.4 (goal 2-3). No fevers, feels well.   Past Medical History:  Diagnosis Date  . Chronic lower back pain    [place]  . DDD (degenerative disc disease), lumbar   . Dysmetabolic syndrome X   . Gout, unspecified   . History of DVT (deep vein thrombosis)   . History of pulmonary embolism   . History of stroke 2009   [place]  . History of thrombophlebitis   . HTN (hypertension)   . Memory loss   . Mixed hyperlipidemia   . Obesity   . PFO (patent foramen ovale)   . Primary hypercoagulable state (HCC) 05/14/2008   Personal h/o DVT, PE, PFO, and lupus AC +   . Stroke (HCC)   . Type II or unspecified type diabetes mellitus without mention of complication, uncontrolled   . Unspecified sleep apnea    Past Surgical History:  Procedure Laterality Date  . COLONOSCOPY  2000   [facility]  . WRIST SURGERY     Right wrist repair post trauma   Family History  Problem Relation Age of Onset  . Emphysema Father   . Diabetes Brother   . Hypertension Brother   . Heart attack Brother 43  . Skin cancer Brother   . Lung cancer Brother   . Cancer Maternal Aunt        ? Type   . Stroke Neg Hx    Social History   Tobacco Use  . Smoking status: Never Smoker  . Smokeless tobacco: Never Used  Substance Use Topics  . Alcohol use: No    Alcohol/week: 0.0 oz  . Drug use: No      Review of Systems Per HPI    Objective:   Physical Exam  Constitutional: He is oriented to person, place, and time. He appears well-developed and well-nourished. No distress.  HENT:  Head: Normocephalic and atraumatic.  Eyes: Conjunctivae are normal.    Cardiovascular: Normal rate.  Pulmonary/Chest: Effort normal.  Neurological: He is alert and oriented to person, place, and time.  Skin: Skin is warm and dry. He is not diaphoretic.  Left forearm with 1.5 cm skin tear. Small amount bloody drainage. Area cleaned and triple antibiotic ointment applied. Bleeding stopped. Non stick bandage applied. No erythema or warmth.   Psychiatric: He has a normal mood and affect. His behavior is normal. Judgment and thought content normal.  Vitals reviewed.     BP 130/78   Pulse 69   Temp 97.9 F (36.6 C) (Oral)   Wt 249 lb 8 oz (113.2 kg)   SpO2 97%   BMI 39.08 kg/m  Wt Readings from Last 3 Encounters:  11/16/17 249 lb 8 oz (113.2 kg)  08/22/17 252 lb 6.4 oz (114.5 kg)  07/17/17 249 lb 12.8 oz (113.3 kg)       Assessment & Plan:  1. Skin tear of forearm without complication, left, initial encounter - no signs/symptoms of infection, encouraged him to trim fingernails - Provided written and verbal information regarding diagnosis and treatment. - RTC/ER precautions reviewed  Olean Reeeborah Gessner, FNP-BC  [facility], [facility]  11/16/2017 9:38 PM

## 2017-11-16 NOTE — Patient Instructions (Signed)
In about 24 hours, please remove bandage, gently wash with mild soap and water, gently pat dry or air dry If area scabbed and not wet looking or drainage- can leave bandage off If bleeding, moist or draining- apply non stick bandage daily. Can apply antibiotic ointment if you would like.  If signs of infection develop, please come back in for visit    Skin Tear Care A skin tear is a wound in which the top layer of skin peels off. To repair the skin, your doctor may use:  Tape.  Skin adhesive strips.  A bandage (dressing) may also be placed over the tape or skin adhesive strips. How to care for your skin tear  Clean the wound as told by your doctor. You may be told to keep the wound dry for the first few days. If you are told to clean the wound: ? Wash the wound with mild soap and water or a salt-water (saline) solution. ? Rinse the wound with water to remove all soap. ? Do not rub the wound dry. Let the wound air dry.  Change any dressings as told by your doctor. This includes changing the dressing if it gets wet, gets dirty, or starts to smell bad.  Do not scratch or pick at the wound.  Protect the injured area until it has healed.  Check your wound every day for signs of infection. Check for: ? More redness, swelling, or pain. ? More fluid or blood. ? Warmth. ? Pus or a bad smell. Medicines   Take over-the-counter and prescription medicines only as told by your doctor.  If you were prescribed an antibiotic medicine, take or apply it as told by your doctor. Do not stop using the antibiotic even if your condition gets better. General instructions  Keep the bandage dry as told by your doctor.  Do not take baths, swim, or do anything that puts your wound underwater until your doctor says it is okay.  Keep all follow-up visits as told by your doctor. This is important. Contact a doctor if:  You have more redness, swelling, or pain around your wound.  You have more fluid  or blood coming from your wound.  Your wound feels warm to the touch.  You have pus or a bad smell coming from your wound. Get help right away if:  You have a red streak that goes away from the skin tear.  You have a fever and chills and your symptoms suddenly get worse. This information is not intended to replace advice given to you by your health care provider. Make sure you discuss any questions you have with your health care provider. Document Released: 08/01/2008 Document Revised: 06/18/2016 Document Reviewed: 09/13/2015 Elsevier Interactive Patient Education  [employer]2018 [employer]

## 2017-12-13 ENCOUNTER — Ambulatory Visit (INDEPENDENT_AMBULATORY_CARE_PROVIDER_SITE_OTHER): Payer: PPO | Admitting: General Practice

## 2017-12-13 DIAGNOSIS — Z7901 Long term (current) use of anticoagulants: Secondary | ICD-10-CM

## 2017-12-13 DIAGNOSIS — Z7902 Long term (current) use of antithrombotics/antiplatelets: Secondary | ICD-10-CM

## 2017-12-13 LAB — POCT INR: INR: 2.1

## 2017-12-13 NOTE — Patient Instructions (Addendum)
Pre visit review using our clinic review tool, if applicable. No additional management support is needed unless otherwise documented below in the visit note.  Continue to take 1 tablet daily except 1/2 tablet on Monday and Fridays.  Re-check in 4 weeks.  

## 2017-12-24 ENCOUNTER — Ambulatory Visit: Payer: Self-pay

## 2017-12-24 NOTE — Telephone Encounter (Signed)
Patient's wife called in c/o "some confusion." She says "yesterday when I got home, my grandson said that he noticed he was confused. My husband thought I had been gone all day and it was night, but it wasn't. I noticed the past few days I've redirected him more. I would tell him to put his clothes in the hamper and he would question what is that and I would have to repeat it to him. He said last night he had a headache, I gave him Tylenol and he went to sleep. This morning he was a little confused, but not worse than any other morning. Now he is not confused and sleeping in the chair. I figured I would call to see if the doctor could see him or if I should take him to the emergency room." I asked about his urination and if it is normal with no strong odors, she said "yes, it's fine." I asked is he or has he had a fever, she said "no." She denies any recent medication changes, weakness, constant headache. According to protocol, see PCP within 24 hours, appointment made for tomorrow with Dr. Sharen HonesGutierrez, care advice given, wife verbalized understanding.  Reason for Disposition . [1] Acting confused (e.g., disoriented, slurred speech) AND [2] brief (now gone)  Answer Assessment - Initial Assessment Questions 1. LEVEL OF CONSCIOUSNESS: "How is he (she, the patient) acting right now?" (e.g., alert-oriented, confused, lethargic, stuporous, comatose)     Not confused right now, sleeping in recliner 2. ONSET: "When did the confusion start?"  (minutes, hours, days)     Yesterday most noticeable, but few days ago it was there. 3. PATTERN "Does this come and go, or has it been constant since it started?"  "Is it present now?"     Come and go, not present now 4. ALCOHOL or DRUGS: "Has he been drinking alcohol or taking any drugs?"      No 5. NARCOTIC MEDICATIONS: "Has he been receiving any narcotic medications?" (e.g., morphine, Vicodin)     No 6. CAUSE: "What do you think is causing the confusion?"      I don't  know 7. OTHER SYMPTOMS: "Are there any other symptoms?" (e.g., difficulty breathing, headache, fever, weakness)     Headache off and on over a week, relieved with tylenol and rest  Protocols used: CONFUSION - DELIRIUM-A-AH

## 2017-12-25 ENCOUNTER — Ambulatory Visit (INDEPENDENT_AMBULATORY_CARE_PROVIDER_SITE_OTHER): Payer: PPO | Admitting: Family Medicine

## 2017-12-25 ENCOUNTER — Encounter: Payer: Self-pay | Admitting: Family Medicine

## 2017-12-25 VITALS — BP 120/70 | HR 56 | Temp 98.3°F | Wt 247.0 lb

## 2017-12-25 DIAGNOSIS — I10 Essential (primary) hypertension: Secondary | ICD-10-CM | POA: Diagnosis not present

## 2017-12-25 DIAGNOSIS — E782 Mixed hyperlipidemia: Secondary | ICD-10-CM

## 2017-12-25 DIAGNOSIS — F0150 Vascular dementia without behavioral disturbance: Secondary | ICD-10-CM

## 2017-12-25 DIAGNOSIS — R404 Transient alteration of awareness: Secondary | ICD-10-CM

## 2017-12-25 DIAGNOSIS — Z8673 Personal history of transient ischemic attack (TIA), and cerebral infarction without residual deficits: Secondary | ICD-10-CM | POA: Diagnosis not present

## 2017-12-25 DIAGNOSIS — E118 Type 2 diabetes mellitus with unspecified complications: Secondary | ICD-10-CM

## 2017-12-25 DIAGNOSIS — Z23 Encounter for immunization: Secondary | ICD-10-CM

## 2017-12-25 LAB — CBC WITH DIFFERENTIAL/PLATELET
BASOS ABS: 0 10*3/uL (ref 0.0–0.1)
Basophils Relative: 0.5 % (ref 0.0–3.0)
EOS ABS: 0.2 10*3/uL (ref 0.0–0.7)
Eosinophils Relative: 2.9 % (ref 0.0–5.0)
HCT: 41.9 % (ref 39.0–52.0)
Hemoglobin: 14 g/dL (ref 13.0–17.0)
LYMPHS ABS: 1.5 10*3/uL (ref 0.7–4.0)
Lymphocytes Relative: 22.9 % (ref 12.0–46.0)
MCHC: 33.4 g/dL (ref 30.0–36.0)
MCV: 90.3 fl (ref 78.0–100.0)
MONO ABS: 0.5 10*3/uL (ref 0.1–1.0)
Monocytes Relative: 8.6 % (ref 3.0–12.0)
NEUTROS ABS: 4.1 10*3/uL (ref 1.4–7.7)
NEUTROS PCT: 65.1 % (ref 43.0–77.0)
PLATELETS: 171 10*3/uL (ref 150.0–400.0)
RBC: 4.64 Mil/uL (ref 4.22–5.81)
RDW: 13.7 % (ref 11.5–15.5)
WBC: 6.3 10*3/uL (ref 4.0–10.5)

## 2017-12-25 LAB — TSH: TSH: 1.01 u[IU]/mL (ref 0.35–4.50)

## 2017-12-25 LAB — BASIC METABOLIC PANEL
BUN: 13 mg/dL (ref 6–23)
CALCIUM: 9.3 mg/dL (ref 8.4–10.5)
CO2: 30 mEq/L (ref 19–32)
CREATININE: 0.96 mg/dL (ref 0.40–1.50)
Chloride: 104 mEq/L (ref 96–112)
GFR: 81.76 mL/min (ref >60.00)
Glucose, Bld: 96 mg/dL (ref 70–99)
Potassium: 4.3 mEq/L (ref 3.5–5.1)
Sodium: 140 mEq/L (ref 135–145)

## 2017-12-25 LAB — HEMOGLOBIN A1C: HEMOGLOBIN A1C: 5.9 % (ref 4.6–6.5)

## 2017-12-25 LAB — LDL CHOLESTEROL, DIRECT: LDL DIRECT: 114 mg/dL

## 2017-12-25 NOTE — Assessment & Plan Note (Signed)
Update A1c ?

## 2017-12-25 NOTE — Progress Notes (Signed)
BP 120/70 (BP Location: Left Arm, Patient Position: Sitting, Cuff Size: Large)   Pulse (!) 56   Temp 98.3 F (36.8 C) (Oral)   Wt 247 lb (112 kg)   SpO2 100%   BMI 38.69 kg/m    CC: AMS Subjective:    Patient ID: Logan Harm., male    DOB: 1945-12-18, 72 y.o.   MRN: [id number]  HPI: Logan Ramus. is a 72 y.o. male presenting on 12/25/2017 for Altered Mental Status (Per wife, pt has some confusion this past weekend. H/o stroke and Parkinson's. Accompanied by wife, Logan Valdez.)   Known memory loss from vascular dementia on namenda 28mg  XR daily, h/o CVA 2009. He sees Dr [name] neurology regularly.   Saturday 12/22/2017 increased confusion noted. Wife left to go to store for 1 hour, pt called son and said she had been gone since the prior day. Also endorsed "funny feeling" but unable to specify exactly what. He does describe "don't feel fully awake when I wake up". Wife endorses occasional slurring of speech. Increasing resting tremor noted. Endorses some gait instability. Moves slowly. Hasn't been checking blood pressures at home. Doesn't like wife to leave him.  Increasing L sided headache. Tylenol helps this. Takes <3 tylenol per day.  Denies unilateral facial weakness, numbness, vision changes. No significant stiffness endorsed.  No recent falls.  Denies fevers/chills, increased cough, or dysuria, urgency or frequency, or diarrhea.  Endorses normal sense of smell.  Doesn't use walker or cane.   Relevant past medical, surgical, family and social history reviewed and updated as indicated. Interim medical history since our last visit reviewed. Allergies and medications reviewed and updated. Outpatient Medications Prior to Visit  Medication Sig Dispense Refill  . acetaminophen (TYLENOL) 500 MG tablet Take 500 mg by mouth 3 (three) times daily.    Marland Kitchen amLODipine (NORVASC) 2.5 MG tablet TAKE 1 TABLET BY MOUTH DAILY 90 tablet 0  . atorvastatin (LIPITOR) 20 MG tablet TAKE 1  TABLET BY MOUTH DAILY 30 tablet 11  . Cyanocobalamin (VITAMIN B-12 PO) Take 2 tablets by mouth 2 (two) times daily.    Marland Kitchen dextromethorphan (DELSYM) 30 MG/5ML liquid Take 15-60 mg by mouth 2 (two) times daily as needed for cough.    . docusate sodium (COLACE) 100 MG capsule Take 100 mg by mouth daily as needed for mild constipation.    . finasteride (PROSCAR) 5 MG tablet TAKE 1 TABLET BY MOUTH DAILY 30 tablet 2  . FOLIC ACID PO Take 3 tablets by mouth 2 (two) times daily.    Marland Kitchen gabapentin (NEURONTIN) 300 MG capsule TAKE ONE CAPSULE BY MOUTH THREE TIMES DAILY 270 capsule 3  . guaiFENesin (MUCINEX) 600 MG 12 hr tablet Take 600-1,200 mg by mouth 2 (two) times daily as needed for cough or to loosen phlegm.    Marland Kitchen HYDROcodone-acetaminophen (NORCO) 10-325 MG per tablet Take 1 tablet by mouth every 6 (six) hours as needed for moderate pain.     Marland Kitchen ipratropium-albuterol (DUONEB) 0.5-2.5 (3) MG/3ML SOLN Take 3 mLs by nebulization every 2 (two) hours as needed. 360 mL 1  . memantine (NAMENDA XR) 28 MG CP24 24 hr capsule Take 1 capsule (28 mg total) by mouth daily. 30 capsule 6  . metoprolol tartrate (LOPRESSOR) 25 MG tablet Take 0.5 tablets (12.5 mg total) by mouth 2 (two) times daily.    . polyethylene glycol powder (GLYCOLAX/MIRALAX) powder DISSOLVE 1 CAPFUL (17GM) INTO 4 TO 8 OUNCES OF FLUID AND TAKE BY MOUTH  ONCE DAILY AS DIRECTED 255 g 0  . Pyridoxine HCl (VITAMIN B-6 PO) Take 1 tablet by mouth 2 (two) times daily.    Marland Kitchen triamcinolone cream (KENALOG) 0.1 % Apply 1 application topically 2 (two) times daily. Apply to AA. (Patient taking differently: Apply 1 application topically 2 (two) times daily as needed (rash). Apply to AA.) 30 g 0  . warfarin (COUMADIN) 5 MG tablet TAKE AS DIRECTED BY THE COUMADIN CLINIC 90 tablet 1   No facility-administered medications prior to visit.      Per HPI unless specifically indicated in ROS section below Review of Systems     Objective:    BP 120/70 (BP Location: Left  Arm, Patient Position: Sitting, Cuff Size: Large)   Pulse (!) 56   Temp 98.3 F (36.8 C) (Oral)   Wt 247 lb (112 kg)   SpO2 100%   BMI 38.69 kg/m   Wt Readings from Last 3 Encounters:  12/25/17 247 lb (112 kg)  11/16/17 249 lb 8 oz (113.2 kg)  08/22/17 252 lb 6.4 oz (114.5 kg)    Physical Exam  Constitutional: He appears well-developed and well-nourished. No distress.  HENT:  Mouth/Throat: Oropharynx is clear and moist. No oropharyngeal exudate.  Eyes: Conjunctivae are normal. Pupils are equal, round, and reactive to light.  Neck: No thyromegaly present.  Cardiovascular: Normal rate, regular rhythm, normal heart sounds and intact distal pulses.  No murmur heard. Pulmonary/Chest: Effort normal and breath sounds normal. No respiratory distress. He has no wheezes. He has no rales.  Musculoskeletal: He exhibits no edema.  Neurological: He is alert. No cranial nerve deficit or sensory deficit. Gait abnormal.  CN 2-12 intact Cogwheel rigidity present Resting tremor present Masked fascies Does not have shuffling gait, but it is unsteady and has somewhat wide based gait  Skin: Skin is warm and dry. No erythema.  Psychiatric: He has a normal mood and affect.  Nursing note and vitals reviewed.  Results for orders placed or performed in visit on 12/13/17  POCT INR  Result Value Ref Range   INR 2.1    Lab Results  Component Value Date   HGBA1C 6.3 05/31/2016    Lab Results  Component Value Date   LDLCALC 75 05/31/2016       Assessment & Plan:   Problem List Items Addressed This Visit    Altered mental status - Primary    In h/o vascular dementia, possible repeat stroke although neurological exam today points against this. Will defer head imaging at this time. No localizing symptoms for infection at this time. Check labs to r/o other cause for AMS (including dLDL and A1c).       Relevant Orders   TSH   Basic metabolic panel   CBC with Differential/Platelet   Diabetes  mellitus type 2, controlled, with complications (HCC)    Update A1c.       Relevant Orders   LDL Cholesterol, Direct   Hemoglobin A1c   History of stroke   HTN (hypertension)    Chronic, stable period on low dose amlodipine/metoprolol      HYPERLIPIDEMIA    Update dLDL      Vascular dementia    On namenda 28mg  XR daily. Wife feels higher dose has been helpful. Exam today suggestive of parkinson picture developing - I asked them to f/u sooner with neurology for further evaluation of this.        Other Visit Diagnoses    Need for influenza vaccination  No orders of the defined types were placed in this encounter.  Orders Placed This Encounter  Procedures  . TSH  . Basic metabolic panel  . CBC with Differential/Platelet  . LDL Cholesterol, Direct  . Hemoglobin A1c    Follow up plan: Return if symptoms worsen or fail to improve.  Eustaquio BoydenJavier Vincente Asbridge, MD

## 2017-12-25 NOTE — Assessment & Plan Note (Addendum)
In h/o vascular dementia, possible repeat stroke although neurological exam today points against this. Will defer head imaging at this time. No localizing symptoms for infection at this time. Check labs to r/o other cause for AMS (including dLDL and A1c).

## 2017-12-25 NOTE — Assessment & Plan Note (Signed)
Update dLDL 

## 2017-12-25 NOTE — Assessment & Plan Note (Signed)
On namenda 28mg  XR daily. Wife feels higher dose has been helpful. Exam today suggestive of parkinson picture developing - I asked them to f/u sooner with neurology for further evaluation of this.

## 2017-12-25 NOTE — Patient Instructions (Addendum)
I wonder about different type of dementia going on - would like you to see the neurologist sooner (sometime next month if able). Call for sooner appointment with neurologist if you can.  Labs today.  Flu shot today

## 2017-12-25 NOTE — Assessment & Plan Note (Addendum)
Chronic, stable period on low dose amlodipine/metoprolol

## 2017-12-30 ENCOUNTER — Other Ambulatory Visit: Payer: Self-pay | Admitting: Family Medicine

## 2017-12-30 DIAGNOSIS — M1A9XX0 Chronic gout, unspecified, without tophus (tophi): Secondary | ICD-10-CM

## 2017-12-30 DIAGNOSIS — E538 Deficiency of other specified B group vitamins: Secondary | ICD-10-CM

## 2017-12-30 DIAGNOSIS — E782 Mixed hyperlipidemia: Secondary | ICD-10-CM

## 2017-12-30 DIAGNOSIS — E118 Type 2 diabetes mellitus with unspecified complications: Secondary | ICD-10-CM

## 2017-12-31 ENCOUNTER — Ambulatory Visit: Payer: PPO

## 2018-01-02 ENCOUNTER — Ambulatory Visit (INDEPENDENT_AMBULATORY_CARE_PROVIDER_SITE_OTHER): Payer: PPO

## 2018-01-02 VITALS — BP 130/80 | HR 45 | Temp 97.8°F | Ht 64.5 in | Wt 247.2 lb

## 2018-01-02 DIAGNOSIS — E118 Type 2 diabetes mellitus with unspecified complications: Secondary | ICD-10-CM

## 2018-01-02 DIAGNOSIS — E782 Mixed hyperlipidemia: Secondary | ICD-10-CM | POA: Diagnosis not present

## 2018-01-02 DIAGNOSIS — E538 Deficiency of other specified B group vitamins: Secondary | ICD-10-CM | POA: Diagnosis not present

## 2018-01-02 DIAGNOSIS — M1A9XX0 Chronic gout, unspecified, without tophus (tophi): Secondary | ICD-10-CM

## 2018-01-02 DIAGNOSIS — Z0000 Encounter for general adult medical examination without abnormal findings: Secondary | ICD-10-CM | POA: Diagnosis not present

## 2018-01-02 LAB — LIPID PANEL
Cholesterol: 194 mg/dL (ref 0–200)
HDL: 52 mg/dL (ref >39.00)
LDL CALC: 120 mg/dL — AB (ref 0–99)
NONHDL: 142.26
Total CHOL/HDL Ratio: 4
Triglycerides: 112 mg/dL (ref 0.0–149.0)
VLDL: 22.4 mg/dL (ref 0.0–40.0)

## 2018-01-02 LAB — HEPATIC FUNCTION PANEL
ALK PHOS: 55 U/L (ref 39–117)
ALT: 17 U/L (ref 0–53)
AST: 20 U/L (ref 0–37)
Albumin: 3.8 g/dL (ref 3.5–5.2)
BILIRUBIN DIRECT: 0.1 mg/dL (ref 0.0–0.3)
BILIRUBIN TOTAL: 0.8 mg/dL (ref 0.2–1.2)
Total Protein: 6.7 g/dL (ref 6.0–8.3)

## 2018-01-02 LAB — MICROALBUMIN / CREATININE URINE RATIO
Creatinine,U: 118.6 mg/dL
MICROALB/CREAT RATIO: 1.3 mg/g (ref 0.0–30.0)
Microalb, Ur: 1.6 mg/dL (ref 0.0–1.9)

## 2018-01-02 LAB — URIC ACID: Uric Acid, Serum: 5.8 mg/dL (ref 4.0–7.8)

## 2018-01-02 LAB — VITAMIN B12: Vitamin B-12: >1500 pg/mL — ABNORMAL HIGH (ref 211–911)

## 2018-01-02 NOTE — Progress Notes (Signed)
Subjective:   Logan Melendrez. is a 72 y.o. male who presents for Medicare Annual/Subsequent preventive examination.  Review of Systems:  N/A Cardiac Risk Factors include: advanced age (>47men, >62 women);obesity (BMI >30kg/m2);male gender;diabetes mellitus;dyslipidemia;hypertension     Objective:    Vitals: BP 130/80 (BP Location: Left Arm, Patient Position: Sitting, Cuff Size: Normal)   Pulse (!) 45   Temp 97.8 F (36.6 C) (Oral)   Ht 5' 4.5" (1.638 m) Comment: no shoes  Wt 247 lb 4 oz (112.2 kg)   SpO2 98%   BMI 41.78 kg/m   Body mass index is 41.78 kg/m.  Advanced Directives 01/02/2018 07/15/2015  Does Patient Have a Medical Advance Directive? Yes Yes  Type of Estate agent of [place];Living will Living will;Healthcare Power of Attorney  Copy of Healthcare Power of Attorney in Chart? Yes No - copy requested    Tobacco Social History   Tobacco Use  Smoking Status Never Smoker  Smokeless Tobacco Never Used     Counseling given: No   Clinical Intake:  Pre-visit preparation completed: Yes  Pain : No/denies pain Pain Score: 0-No pain     Nutritional Status: BMI > 30  Obese Nutritional Risks: None Diabetes: Yes CBG done?: No Did pt. bring in CBG monitor from home?: No  How often do you need to have someone help you when you read instructions, pamphlets, or other written materials from your doctor or pharmacy?: 1 - Never What is the last grade level you completed in school?: 8th grade  Interpreter Needed?: No  Comments: pt lives with spouse Information entered by :: LPinson, LPN  Past Medical History:  Diagnosis Date  . Chronic lower back pain    [place]  . DDD (degenerative disc disease), lumbar   . Dysmetabolic syndrome X   . Gout, unspecified   . History of DVT (deep vein thrombosis)   . History of pulmonary embolism   . History of stroke 2009   [place]  . History of thrombophlebitis   . HTN (hypertension)   . Memory  loss   . Mixed hyperlipidemia   . Obesity   . PFO (patent foramen ovale)   . Primary hypercoagulable state (HCC) 05/14/2008   Personal h/o DVT, PE, PFO, and lupus AC +   . Stroke (HCC)   . Type II or unspecified type diabetes mellitus without mention of complication, uncontrolled   . Unspecified sleep apnea    Past Surgical History:  Procedure Laterality Date  . COLONOSCOPY  2000   [facility]  . WRIST SURGERY     Right wrist repair post trauma   Family History  Problem Relation Age of Onset  . Emphysema Father   . Diabetes Brother   . Hypertension Brother   . Heart attack Brother 43  . Skin cancer Brother   . Lung cancer Brother   . Cancer Maternal Aunt        ? Type   . Stroke Neg Hx    Social History   Socioeconomic History  . Marital status: Married    Spouse name: None  . Number of children: 4  . Years of education: 8 th  . Highest education level: None  Social Needs  . Financial resource strain: None  . Food insecurity - worry: None  . Food insecurity - inability: None  . Transportation needs - medical: None  . Transportation needs - non-medical: None  Occupational History  . None  Tobacco Use  .  Smoking status: Never Smoker  . Smokeless tobacco: Never Used  Substance and Sexual Activity  . Alcohol use: No    Alcohol/week: 0.0 oz  . Drug use: No  . Sexual activity: Not Currently  Other Topics Concern  . None  Social History Narrative   Patient is married with 4 children.   Patient is right handed.   Patient has 8 th grade education.   Patient drinks 4-5 cups daily.    Outpatient Encounter Medications as of 01/02/2018  Medication Sig  . acetaminophen (TYLENOL) 500 MG tablet Take 500 mg by mouth 3 (three) times daily.  Marland Kitchen. amLODipine (NORVASC) 2.5 MG tablet TAKE 1 TABLET BY MOUTH DAILY  . atorvastatin (LIPITOR) 20 MG tablet TAKE 1 TABLET BY MOUTH DAILY  . Cyanocobalamin (VITAMIN B-12 PO) Take 2 tablets by mouth 2 (two) times daily.  Marland Kitchen. docusate sodium  (COLACE) 100 MG capsule Take 100 mg by mouth daily as needed for mild constipation.  . finasteride (PROSCAR) 5 MG tablet TAKE 1 TABLET BY MOUTH DAILY  . FOLIC ACID PO Take 3 tablets by mouth 2 (two) times daily.  Marland Kitchen. gabapentin (NEURONTIN) 300 MG capsule TAKE ONE CAPSULE BY MOUTH THREE TIMES DAILY  . memantine (NAMENDA XR) 28 MG CP24 24 hr capsule Take 1 capsule (28 mg total) by mouth daily.  . metoprolol tartrate (LOPRESSOR) 25 MG tablet Take 0.5 tablets (12.5 mg total) by mouth 2 (two) times daily.  . polyethylene glycol powder (GLYCOLAX/MIRALAX) powder DISSOLVE 1 CAPFUL (17GM) INTO 4 TO 8 OUNCES OF FLUID AND TAKE BY MOUTH ONCE DAILY AS DIRECTED  . Pyridoxine HCl (VITAMIN B-6 PO) Take 1 tablet by mouth 2 (two) times daily.  Marland Kitchen. triamcinolone cream (KENALOG) 0.1 % Apply 1 application topically 2 (two) times daily. Apply to AA. (Patient taking differently: Apply 1 application topically 2 (two) times daily as needed (rash). Apply to AA.)  . warfarin (COUMADIN) 5 MG tablet TAKE AS DIRECTED BY THE COUMADIN CLINIC  . dextromethorphan (DELSYM) 30 MG/5ML liquid Take 15-60 mg by mouth 2 (two) times daily as needed for cough.  Marland Kitchen. guaiFENesin (MUCINEX) 600 MG 12 hr tablet Take 600-1,200 mg by mouth 2 (two) times daily as needed for cough or to loosen phlegm.  Marland Kitchen. HYDROcodone-acetaminophen (NORCO) 10-325 MG per tablet Take 1 tablet by mouth every 6 (six) hours as needed for moderate pain.   Marland Kitchen. ipratropium-albuterol (DUONEB) 0.5-2.5 (3) MG/3ML SOLN Take 3 mLs by nebulization every 2 (two) hours as needed. (Patient not taking: Reported on 01/02/2018)  . [DISCONTINUED] metoprolol succinate (TOPROL-XL) 100 MG 24 hr tablet 1/2 by mouth daily   No facility-administered encounter medications on file as of 01/02/2018.     Activities of Daily Living In your present state of health, do you have any difficulty performing the following activities: 01/02/2018 02/17/2017  Hearing? Y N  Vision? Y N  Difficulty concentrating or  making decisions? Malvin JohnsY Y  Walking or climbing stairs? Y Y  Dressing or bathing? Y N  Doing errands, shopping? Malvin JohnsY Y  Preparing Food and eating ? Y -  Using the Toilet? Y -  In the past six months, have you accidently leaked urine? Y -  Do you have problems with loss of bowel control? Y -  Managing your Medications? Y -  Managing your Finances? Y -  Housekeeping or managing your Housekeeping? Y -  Some recent data might be hidden    Patient Care Team: Eustaquio BoydenGutierrez, Javier, MD as PCP - General (Family  Medicine)   Assessment:   This is a routine wellness examination for [place].  Hearing Screening Comments: Hearing aids needed based on formal hearing exam; pt declined to wear them at that time Vision Screening Comments: Eye screen at CPE appt; pt does not have corrective lenses   Exercise Activities and Dietary recommendations Current Exercise Habits: The patient does not participate in regular exercise at present, Exercise limited by: psychological condition(s);orthopedic condition(s)  Goals    . Follow up with Primary Care Provider     Starting 01/02/2018, I will continue to take medications as prescribed and to keep appointments with PCP as scheduled.        Fall Risk Fall Risk  01/02/2018 05/10/2017 06/08/2016 12/22/2014 12/16/2013  Falls in the past year? No No No No -  Risk for fall due to : - - - - Other (Comment)  Risk for fall due to: Comment - - - - pt said he has never fallen  Depression Screen PHQ 2/9 Scores 01/02/2018 06/08/2016 12/22/2014 12/16/2013  PHQ - 2 Score 0 0 0 2  PHQ- 9 Score 0 - - 6    Cognitive Function MMSE - Mini Mental State Exam 01/02/2018 08/22/2017 05/10/2017 07/14/2016 12/09/2014  Not completed: (No Data) - - - -  Orientation to time - 4 0 1 2  Orientation to Place - 2 4 3 3   Registration - 3 3 3 3   Attention/ Calculation - 0 0 0 0  Recall - 0 0 2 2  Language- name 2 objects - 1 2 2 2   Language- repeat - 1 1 1 1   Language- follow 3 step command - 2 3 3 3     Language- read & follow direction - 0 0 0 0  Write a sentence - 0 0 0 0  Copy design - 0 0 0 0  Total score - 13 13 15 16         Immunization History  Administered Date(s) Administered  . Influenza Split 09/18/2012  . Influenza Whole 09/03/2008  . Influenza, High Dose Seasonal PF 08/21/2013  . Influenza,inj,Quad PF,6+ Mos 09/07/2014, 09/07/2014, 09/07/2015, 12/25/2017  . Pneumococcal Conjugate-13 12/22/2014  . Pneumococcal Polysaccharide-23 06/08/2016    Qualifies for Shingles Vaccine? ??  Screening Tests Health Maintenance  Topic Date Due  . FOOT EXAM  01/07/2018 (Originally 06/21/2016)  . DTaP/Tdap/Td (1 - Tdap) 01/02/2019 (Originally 08/31/1965)  . OPHTHALMOLOGY EXAM  01/02/2019 (Originally 09/01/1956)  . TETANUS/TDAP  01/02/2019 (Originally 08/31/1965)  . COLONOSCOPY  01/02/2049 (Originally 11/08/2008)  . HEMOGLOBIN A1C  06/24/2018  . URINE MICROALBUMIN  01/02/2019  . INFLUENZA VACCINE  Completed  . Hepatitis C Screening  Completed  . PNA vac Low Risk Adult  Completed       Plan:     I have personally reviewed, addressed, and noted the following in the patient's chart:  A. Medical and social history B. Use of alcohol, tobacco or illicit drugs  C. Current medications and supplements D. Functional ability and status E.  Nutritional status F.  Physical activity G. Advance directives H. List of other physicians I.  Hospitalizations, surgeries, and ER visits in previous 12 months J.  Vitals K. Screenings to include hearing, vision, cognitive, depression L. Referrals and appointments - none  In addition, I have reviewed and discussed with patient certain preventive protocols, quality metrics, and best practice recommendations. A written personalized care plan for preventive services as well as general preventive health recommendations were provided to patient.  See attached scanned  questionnaire for additional information.   Signed,   Randa Evens, MHA, BS,  LPN Health Coach

## 2018-01-02 NOTE — Patient Instructions (Signed)
Logan Valdez , Thank you for taking time to come for your Medicare Wellness Visit. I appreciate your ongoing commitment to your health goals. Please review the following plan we discussed and let me know if I can assist you in the future.   These are the goals we discussed: Goals    . Follow up with Primary Care Provider     Starting 01/02/2018, I will continue to take medications as prescribed and to keep appointments with PCP as scheduled.        This is a list of the screening recommended for you and due dates:  Health Maintenance  Topic Date Due  . Complete foot exam   01/07/2018*  . DTaP/Tdap/Td vaccine (1 - Tdap) 01/02/2019*  . Eye exam for diabetics  01/02/2019*  . Tetanus Vaccine  01/02/2019*  . Colon Cancer Screening  01/02/2049*  . Hemoglobin A1C  06/24/2018  . Urine Protein Check  01/02/2019  . Flu Shot  Completed  .  Hepatitis C: One time screening is recommended by Center for Disease Control  (CDC) for  adults born from [birth year] through 1966.   Completed  . Pneumonia vaccines  Completed  *Topic was postponed. The date shown is not the original due date.   Preventive Care for Adults  A healthy lifestyle and preventive care can promote health and wellness. Preventive health guidelines for adults include the following key practices.  . A routine yearly physical is a good way to check with your health care provider about your health and preventive screening. It is a chance to share any concerns and updates on your health and to receive a thorough exam.  . Visit your dentist for a routine exam and preventive care every 6 months. Brush your teeth twice a day and floss once a day. Good oral hygiene prevents tooth decay and gum disease.  . The frequency of eye exams is based on your age, health, family medical history, use  of contact lenses, and other factors. Follow your health care provider's recommendations for frequency of eye exams.  . Eat a healthy diet. Foods like  vegetables, fruits, whole grains, low-fat dairy products, and lean protein foods contain the nutrients you need without too many calories. Decrease your intake of foods high in solid fats, added sugars, and salt. Eat the right amount of calories for you. Get information about a proper diet from your health care provider, if necessary.  . Regular physical exercise is one of the most important things you can do for your health. Most adults should get at least 150 minutes of moderate-intensity exercise (any activity that increases your heart rate and causes you to sweat) each week. In addition, most adults need muscle-strengthening exercises on 2 or more days a week.  Silver Sneakers may be a benefit available to you. To determine eligibility, you may visit the website: www.silversneakers.com or contact program at [phone] Mon-Fri between 8AM-8PM.   . Maintain a healthy weight. The body mass index (BMI) is a screening tool to identify possible weight problems. It provides an estimate of body fat based on height and weight. Your health care provider can find your BMI and can help you achieve or maintain a healthy weight.   For adults 20 years and older: ? A BMI below 18.5 is considered underweight. ? A BMI of 18.5 to 24.9 is normal. ? A BMI of 25 to 29.9 is considered overweight. ? A BMI of 30 and above is considered obese.   .Marland Kitchen  Maintain normal blood lipids and cholesterol levels by exercising and minimizing your intake of saturated fat. Eat a balanced diet with plenty of fruit and vegetables. Blood tests for lipids and cholesterol should begin at age 10 and be repeated every 5 years. If your lipid or cholesterol levels are high, you are over 50, or you are at high risk for heart disease, you may need your cholesterol levels checked more frequently. Ongoing high lipid and cholesterol levels should be treated with medicines if diet and exercise are not working.  . If you smoke, find out from your  health care provider how to quit. If you do not use tobacco, please do not start.  . If you choose to drink alcohol, please do not consume more than 2 drinks per day. One drink is considered to be 12 ounces (355 mL) of beer, 5 ounces (148 mL) of wine, or 1.5 ounces (44 mL) of liquor.  . If you are 62-62 years old, ask your health care provider if you should take aspirin to prevent strokes.  . Use sunscreen. Apply sunscreen liberally and repeatedly throughout the day. You should seek shade when your shadow is shorter than you. Protect yourself by wearing long sleeves, pants, a wide-brimmed hat, and sunglasses year round, whenever you are outdoors.  . Once a month, do a whole body skin exam, using a mirror to look at the skin on your back. Tell your health care provider of new moles, moles that have irregular borders, moles that are larger than a pencil eraser, or moles that have changed in shape or color.

## 2018-01-02 NOTE — Progress Notes (Signed)
PCP notes:   Health maintenance:  Foot exam - PCP please address at next appt Eye exam - addressed; screen needed at CPE appt Tetanus vaccine - postponed/insurance Colonoscopy - per spouse, pt unable to do screening at this time Microalbumin - completed  Abnormal screenings:   None  Patient concerns:   None  Nurse concerns:  None  Next PCP appt:   01/07/18 @ 1500

## 2018-01-06 NOTE — Progress Notes (Signed)
I reviewed health advisor's note, was available for consultation, and agree with documentation and plan.  

## 2018-01-07 ENCOUNTER — Ambulatory Visit (INDEPENDENT_AMBULATORY_CARE_PROVIDER_SITE_OTHER): Payer: PPO | Admitting: Family Medicine

## 2018-01-07 ENCOUNTER — Encounter: Payer: Self-pay | Admitting: Family Medicine

## 2018-01-07 VITALS — BP 122/82 | HR 62 | Temp 97.9°F | Ht 65.0 in | Wt 247.0 lb

## 2018-01-07 DIAGNOSIS — I10 Essential (primary) hypertension: Secondary | ICD-10-CM | POA: Diagnosis not present

## 2018-01-07 DIAGNOSIS — D6859 Other primary thrombophilia: Secondary | ICD-10-CM | POA: Diagnosis not present

## 2018-01-07 DIAGNOSIS — Z7189 Other specified counseling: Secondary | ICD-10-CM | POA: Diagnosis not present

## 2018-01-07 DIAGNOSIS — M1A9XX0 Chronic gout, unspecified, without tophus (tophi): Secondary | ICD-10-CM

## 2018-01-07 DIAGNOSIS — E782 Mixed hyperlipidemia: Secondary | ICD-10-CM | POA: Diagnosis not present

## 2018-01-07 DIAGNOSIS — I63219 Cerebral infarction due to unspecified occlusion or stenosis of unspecified vertebral arteries: Secondary | ICD-10-CM | POA: Diagnosis not present

## 2018-01-07 DIAGNOSIS — Z0000 Encounter for general adult medical examination without abnormal findings: Secondary | ICD-10-CM

## 2018-01-07 DIAGNOSIS — Z515 Encounter for palliative care: Secondary | ICD-10-CM | POA: Insufficient documentation

## 2018-01-07 DIAGNOSIS — F0150 Vascular dementia without behavioral disturbance: Secondary | ICD-10-CM | POA: Diagnosis not present

## 2018-01-07 DIAGNOSIS — E538 Deficiency of other specified B group vitamins: Secondary | ICD-10-CM | POA: Diagnosis not present

## 2018-01-07 DIAGNOSIS — J449 Chronic obstructive pulmonary disease, unspecified: Secondary | ICD-10-CM

## 2018-01-07 MED ORDER — VITAMIN B-12 1000 MCG PO TABS: 500.0000 ug | ORAL_TABLET | Freq: Every day | ORAL | Status: DC

## 2018-01-07 MED ORDER — ATORVASTATIN CALCIUM 40 MG PO TABS: 40.0000 mg | ORAL_TABLET | Freq: Every day | ORAL | 1 refills | Status: DC

## 2018-01-07 NOTE — Assessment & Plan Note (Signed)
Advanced directive discussion - has at home, will bring us copy. HCPOA is wife Logan Valdez. 

## 2018-01-07 NOTE — Assessment & Plan Note (Signed)
Preventative protocols reviewed and updated unless pt declined. Discussed healthy diet and lifestyle.  

## 2018-01-07 NOTE — Assessment & Plan Note (Signed)
Stable.       - Continue to monitor

## 2018-01-07 NOTE — Progress Notes (Signed)
BP 122/82 (BP Location: Left Arm, Patient Position: Sitting, Cuff Size: Large)   Pulse 62   Temp 97.9 F (36.6 C) (Oral)   Ht 5\' 5"  (1.651 m)   Wt 247 lb (112 kg)   SpO2 97%   BMI 41.10 kg/m    CC: CPE Subjective:    Patient ID: Logan Valdez., male    DOB: 02-Nov-1946, 72 y.o.   MRN: [id number]  HPI: Logan Valdez. is a 72 y.o. male presenting on 01/07/2018 for Annual Exam (Pt 2.)   Saw Logan Valdez last week for medicare wellness visit. Note reviewed.   Followed by neurology for vascular dementia Logan Valdez). Latest namenda XR was increased to 28mg  daily. Wife thinks he's done better these last few months beacuse he has been more active - mother in law has been in and out of hospital then nursing home, they have visited her almost daily - increased walking, getting out of house.   Preventative: COLONOSCOPY Date: 2000 [facility]. Age out due to dementia.  Colon cancer screening - ifob positive 12/2014. After discussion has decided to postpone.  Prostate cancer screening - discussed with pt and wife, decide to postpone.  Flu shot - yearly prevnar 12/2014. Pneumovax 2017 Tetanus shot - unsure shingrix - discussed Advanced directive discussion - has at home, will bring [place] copy. HCPOA is wife Logan Valdez.  Seat belt use discussed.  Sunscreen use discussed. No changing moles on skin. Non smoker Alcohol - rare  Patient is married with 4 children. Patient is right handed.  Patient has 8 th grade education.  Lives with wife Logan Valdez  Relevant past medical, surgical, family and social history reviewed and updated as indicated. Interim medical history since our last visit reviewed. Allergies and medications reviewed and updated. Outpatient Medications Prior to Visit  Medication Sig Dispense Refill  . acetaminophen (TYLENOL) 500 MG tablet Take 500 mg by mouth 3 (three) times daily.    Marland Kitchen amLODipine (NORVASC) 2.5 MG tablet TAKE 1 TABLET BY MOUTH DAILY 90 tablet 0  . dextromethorphan  (DELSYM) 30 MG/5ML liquid Take 15-60 mg by mouth 2 (two) times daily as needed for cough.    . docusate sodium (COLACE) 100 MG capsule Take 100 mg by mouth daily as needed for mild constipation.    . finasteride (PROSCAR) 5 MG tablet TAKE 1 TABLET BY MOUTH DAILY 30 tablet 2  . FOLIC ACID PO Take 3 tablets by mouth 2 (two) times daily.    Marland Kitchen gabapentin (NEURONTIN) 300 MG capsule TAKE ONE CAPSULE BY MOUTH THREE TIMES DAILY 270 capsule 3  . guaiFENesin (MUCINEX) 600 MG 12 hr tablet Take 600-1,200 mg by mouth 2 (two) times daily as needed for cough or to loosen phlegm.    Marland Kitchen HYDROcodone-acetaminophen (NORCO) 10-325 MG per tablet Take 1 tablet by mouth every 6 (six) hours as needed for moderate pain.     Marland Kitchen ipratropium-albuterol (DUONEB) 0.5-2.5 (3) MG/3ML SOLN Take 3 mLs by nebulization every 2 (two) hours as needed. 360 mL 1  . memantine (NAMENDA XR) 28 MG CP24 24 hr capsule Take 1 capsule (28 mg total) by mouth daily. 30 capsule 6  . metoprolol tartrate (LOPRESSOR) 25 MG tablet Take 0.5 tablets (12.5 mg total) by mouth 2 (two) times daily.    . polyethylene glycol powder (GLYCOLAX/MIRALAX) powder DISSOLVE 1 CAPFUL (17GM) INTO 4 TO 8 OUNCES OF FLUID AND TAKE BY MOUTH ONCE DAILY AS DIRECTED 255 g 0  . Pyridoxine HCl (VITAMIN B-6 PO)  Take 1 tablet by mouth 2 (two) times daily.    Marland Kitchen triamcinolone cream (KENALOG) 0.1 % Apply 1 application topically 2 (two) times daily. Apply to AA. (Patient taking differently: Apply 1 application topically 2 (two) times daily as needed (rash). Apply to AA.) 30 g 0  . warfarin (COUMADIN) 5 MG tablet TAKE AS DIRECTED BY THE COUMADIN CLINIC 90 tablet 1  . atorvastatin (LIPITOR) 20 MG tablet TAKE 1 TABLET BY MOUTH DAILY 30 tablet 11  . Cyanocobalamin (VITAMIN B-12 PO) Take 2 tablets by mouth 2 (two) times daily.     No facility-administered medications prior to visit.      Per HPI unless specifically indicated in ROS section below Review of Systems  Constitutional: Negative  for activity change, appetite change, chills, fatigue, fever and unexpected weight change.  HENT: Negative for hearing loss.   Eyes: Negative for visual disturbance.  Respiratory: Negative for cough, chest tightness, shortness of breath and wheezing.   Cardiovascular: Negative for chest pain, palpitations and leg swelling.  Gastrointestinal: Positive for constipation. Negative for abdominal distention, abdominal pain, blood in stool, diarrhea, nausea and vomiting.  Genitourinary: Negative for difficulty urinating and hematuria.  Musculoskeletal: Negative for arthralgias, myalgias and neck pain.  Skin: Negative for rash.  Neurological: Negative for dizziness (occasional), seizures, syncope and headaches.  Hematological: Negative for adenopathy. Bruises/bleeds easily.  Psychiatric/Behavioral: Negative for dysphoric mood. The patient is not nervous/anxious.        Objective:    BP 122/82 (BP Location: Left Arm, Patient Position: Sitting, Cuff Size: Large)   Pulse 62   Temp 97.9 F (36.6 C) (Oral)   Ht 5\' 5"  (1.651 m)   Wt 247 lb (112 kg)   SpO2 97%   BMI 41.10 kg/m   Wt Readings from Last 3 Encounters:  01/07/18 247 lb (112 kg)  01/02/18 247 lb 4 oz (112.2 kg)  12/25/17 247 lb (112 kg)    Physical Exam  Constitutional: He is oriented to person, place, and time. He appears well-developed and well-nourished. No distress.  HENT:  Head: Normocephalic and atraumatic.  Right Ear: Hearing, tympanic membrane, external ear and ear canal normal.  Left Ear: Hearing, tympanic membrane, external ear and ear canal normal.  Nose: Nose normal.  Mouth/Throat: Uvula is midline, oropharynx is clear and moist and mucous membranes are normal. No oropharyngeal exudate, posterior oropharyngeal edema or posterior oropharyngeal erythema.  Eyes: Conjunctivae and EOM are normal. Pupils are equal, round, and reactive to light. No scleral icterus.  Neck: Normal range of motion. Neck supple. No thyromegaly  present.  Cardiovascular: Normal rate, regular rhythm, normal heart sounds and intact distal pulses.  No murmur heard. Pulses:      Radial pulses are 2+ on the right side, and 2+ on the left side.  Pulmonary/Chest: Effort normal and breath sounds normal. No respiratory distress. He has no wheezes. He has no rales.  Abdominal: Soft. Bowel sounds are normal. He exhibits no distension and no mass. There is no tenderness. There is no rebound and no guarding.  Musculoskeletal: Normal range of motion. He exhibits no edema.  Lymphadenopathy:    He has no cervical adenopathy.  Neurological: He is alert and oriented to person, place, and time.  CN grossly intact, station and gait intact  Skin: Skin is warm and dry. No rash noted.  Psychiatric: He has a normal mood and affect. His behavior is normal. Judgment and thought content normal.  Nursing note and vitals reviewed.  Results for  orders placed or performed in visit on 01/02/18  Microalbumin / creatinine urine ratio  Result Value Ref Range   Microalb, Ur 1.6 0.0 - 1.9 mg/dL   Creatinine,U 147.8118.6 mg/dL   Microalb Creat Ratio 1.3 0.0 - 30.0 mg/g  Hepatic function panel  Result Value Ref Range   Total Bilirubin 0.8 0.2 - 1.2 mg/dL   Bilirubin, Direct 0.1 0.0 - 0.3 mg/dL   Alkaline Phosphatase 55 39 - 117 U/L   AST 20 0 - 37 U/L   ALT 17 0 - 53 U/L   Total Protein 6.7 6.0 - 8.3 g/dL   Albumin 3.8 3.5 - 5.2 g/dL  Uric acid  Result Value Ref Range   Uric Acid, Serum 5.8 4.0 - 7.8 mg/dL  Lipid panel  Result Value Ref Range   Cholesterol 194 0 - 200 mg/dL   Triglycerides 295.6112.0 0.0 - 149.0 mg/dL   HDL 21.3052.00 >86.57>39.00 mg/dL   VLDL 84.622.4 0.0 - 96.240.0 mg/dL   LDL Cholesterol 952120 (H) 0 - 99 mg/dL   Total CHOL/HDL Ratio 4    NonHDL 142.26   Vitamin B12  Result Value Ref Range   Vitamin B-12 >1500 (H) 211 - 911 pg/mL      Assessment & Plan:   Problem List Items Addressed This Visit    Advanced care planning/counseling discussion    Advanced  directive discussion - has at home, will bring us copy. HCPOA is wife Logan Fifenez.       B12 deficiency    Levels remain high - suggested decreasing b12 to 500mcg daily.      COPD (chronic obstructive pulmonary disease) (HCC)    Unclear diagnosis. Did not undergo spirometry, may be difficult to get accurate reading currently managed with PRN duonebs. Continue. Does not regularly use.      Gout    Chronic, stable. Continue diet control. No recent gout flares. Urate levels stable.       Health maintenance examination - Primary    Preventative protocols reviewed and updated unless pt declined. Discussed healthy diet and lifestyle.       HTN (hypertension)    Chronic, stable. Continue current regimen.       Relevant Medications   atorvastatin (LIPITOR) 40 MG tablet   HYPERLIPIDEMIA    Chronic, deteriorated - will increase lipitor to 40mg  daily. Discussed returning in 6 months for recheck. The ASCVD Risk score Denman George(Goff DC Jr., et al., 2013) failed to calculate for the following reasons:   The patient has a prior MI or stroke diagnosis       Relevant Medications   atorvastatin (LIPITOR) 40 MG tablet   Obesity, Class III, BMI 40-49.9 (morbid obesity) (HCC)    Stable. Continue to monitor.       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       Relevant Medications   atorvastatin (LIPITOR) 40 MG tablet   Primary hypercoagulable state (HCC)    Continue coumadin. Lupus AC +.       Vascular dementia    Appreciate neuro care. ?parkinsonisms, I recommended neuro eval for this.           Meds ordered this encounter  Medications  . vitamin B-12 (CYANOCOBALAMIN) 1000 MCG tablet    Sig: Take 0.5 tablets (500 mcg total) by mouth daily.  Marland Kitchen. atorvastatin (LIPITOR) 40 MG tablet    Sig: Take 1 tablet (40 mg total) by mouth daily.  Dispense:  90 tablet    Refill:  1    Note new dose   No orders of the defined types were placed in this  encounter.   Follow up plan: Return in about 6 months (around 07/10/2018) for medicare wellness visit, follow up visit.  Eustaquio Boyden, MD

## 2018-01-07 NOTE — Assessment & Plan Note (Signed)
Continue coumadin. Lupus AC +.

## 2018-01-07 NOTE — Assessment & Plan Note (Signed)
Chronic, stable. Continue current regimen. 

## 2018-01-07 NOTE — Assessment & Plan Note (Signed)
Unclear diagnosis. Did not undergo spirometry, may be difficult to get accurate reading currently managed with PRN duonebs. Continue. Does not regularly use.

## 2018-01-07 NOTE — Assessment & Plan Note (Signed)
Continue statin. 

## 2018-01-07 NOTE — Assessment & Plan Note (Signed)
Chronic, stable. Continue diet control. No recent gout flares. Urate levels stable.

## 2018-01-07 NOTE — Assessment & Plan Note (Signed)
Appreciate neuro care. ?parkinsonisms, I recommended neuro eval for this.

## 2018-01-07 NOTE — Assessment & Plan Note (Signed)
Chronic, deteriorated - will increase lipitor to 40mg  daily. Discussed returning in 6 months for recheck. The ASCVD Risk score Logan George(Goff DC Jr., Logan al., 2013) failed to calculate for the following reasons:   The patient has a prior MI or stroke diagnosis

## 2018-01-07 NOTE — Assessment & Plan Note (Signed)
Levels remain high - suggested decreasing b12 to daily.

## 2018-01-07 NOTE — Patient Instructions (Addendum)
If interested, check with pharmacy about new 2 shot shingles series (shingrix).  Bring us copy of your living will to update your chart.  Decrease vitamin B12 to 500mcg daily.  Increase lipitor to 40mg  daily - let us know if trouble tolerating.  Return in 6 months for follow up visit with prior fasting labs.   Health Maintenance, Male A healthy lifestyle and preventive care is important for your health and wellness. Ask your health care provider about what schedule of regular examinations is right for you. What should I know about weight and diet? Eat a Healthy Diet  Eat plenty of vegetables, fruits, whole grains, low-fat dairy products, and lean protein.  Do not eat a lot of foods high in solid fats, added sugars, or salt.  Maintain a Healthy Weight Regular exercise can help you achieve or maintain a healthy weight. You should:  Do at least 150 minutes of exercise each week. The exercise should increase your heart rate and make you sweat (moderate-intensity exercise).  Do strength-training exercises at least twice a week.  Watch Your Levels of Cholesterol and Blood Lipids  Have your blood tested for lipids and cholesterol every 5 years starting at 73 years of age. If you are at high risk for heart disease, you should start having your blood tested when you are 73 years old. You may need to have your cholesterol levels checked more often if: ? Your lipid or cholesterol levels are high. ? You are older than 73 years of age. ? You are at high risk for heart disease.  What should I know about cancer screening? Many types of cancers can be detected early and may often be prevented. Lung Cancer  You should be screened every year for lung cancer if: ? You are a current smoker who has smoked for at least 30 years. ? You are a former smoker who has quit within the past 15 years.  Talk to your health care provider about your screening options, when you should start screening, and how  often you should be screened.  Colorectal Cancer  Routine colorectal cancer screening usually begins at 73 years of age and should be repeated every 5-10 years until you are 73 years old. You may need to be screened more often if early forms of precancerous polyps or small growths are found. Your health care provider may recommend screening at an earlier age if you have risk factors for colon cancer.  Your health care provider may recommend using home test kits to check for hidden blood in the stool.  A small camera at the end of a tube can be used to examine your colon (sigmoidoscopy or colonoscopy). This checks for the earliest forms of colorectal cancer.  Prostate and Testicular Cancer  Depending on your age and overall health, your health care provider may do certain tests to screen for prostate and testicular cancer.  Talk to your health care provider about any symptoms or concerns you have about testicular or prostate cancer.  Skin Cancer  Check your skin from head to toe regularly.  Tell your health care provider about any new moles or changes in moles, especially if: ? There is a change in a mole's size, shape, or color. ? You have a mole that is larger than a pencil eraser.  Always use sunscreen. Apply sunscreen liberally and repeat throughout the day.  Protect yourself by wearing long sleeves, pants, a wide-brimmed hat, and sunglasses when outside.  What should I know  about heart disease, diabetes, and high blood pressure?  If you are 59-10 years of age, have your blood pressure checked every 3-5 years. If you are 67 years of age or older, have your blood pressure checked every year. You should have your blood pressure measured twice-once when you are at a hospital or clinic, and once when you are not at a hospital or clinic. Record the average of the two measurements. To check your blood pressure when you are not at a hospital or clinic, you can use: ? An automated blood  pressure machine at a pharmacy. ? A home blood pressure monitor.  Talk to your health care provider about your target blood pressure.  If you are between 73-23 years old, ask your health care provider if you should take aspirin to prevent heart disease.  Have regular diabetes screenings by checking your fasting blood sugar level. ? If you are at a normal weight and have a low risk for diabetes, have this test once every three years after the age of 67. ? If you are overweight and have a high risk for diabetes, consider being tested at a younger age or more often.  A one-time screening for abdominal aortic aneurysm (AAA) by ultrasound is recommended for men aged 39-75 years who are current or former smokers. What should I know about preventing infection? Hepatitis B If you have a higher risk for hepatitis B, you should be screened for this virus. Talk with your health care provider to find out if you are at risk for hepatitis B infection. Hepatitis C Blood testing is recommended for:  Everyone born from [birth year] through 1966.  Anyone with known risk factors for hepatitis C.  Sexually Transmitted Diseases (STDs)  You should be screened each year for STDs including gonorrhea and chlamydia if: ? You are sexually active and are younger than 73 years of age. ? You are older than 73 years of age and your health care provider tells you that you are at risk for this type of infection. ? Your sexual activity has changed since you were last screened and you are at an increased risk for chlamydia or gonorrhea. Ask your health care provider if you are at risk.  Talk with your health care provider about whether you are at high risk of being infected with HIV. Your health care provider may recommend a prescription medicine to help prevent HIV infection.  What else can I do?  Schedule regular health, dental, and eye exams.  Stay current with your vaccines (immunizations).  Do not use any tobacco  products, such as cigarettes, chewing tobacco, and e-cigarettes. If you need help quitting, ask your health care provider.  Limit alcohol intake to no more than 2 drinks per day. One drink equals 12 ounces of beer, 5 ounces of wine, or 1 ounces of hard liquor.  Do not use street drugs.  Do not share needles.  Ask your health care provider for help if you need support or information about quitting drugs.  Tell your health care provider if you often feel depressed.  Tell your health care provider if you have ever been abused or do not feel safe at home. This information is not intended to replace advice given to you by your health care provider. Make sure you discuss any questions you have with your health care provider. Document Released: 04/20/2008 Document Revised: 06/21/2016 Document Reviewed: 07/27/2015 Elsevier Interactive Patient Education  [employer].

## 2018-01-08 ENCOUNTER — Ambulatory Visit (INDEPENDENT_AMBULATORY_CARE_PROVIDER_SITE_OTHER): Payer: PPO

## 2018-01-08 DIAGNOSIS — Z7901 Long term (current) use of anticoagulants: Secondary | ICD-10-CM

## 2018-01-08 DIAGNOSIS — Z7902 Long term (current) use of antithrombotics/antiplatelets: Secondary | ICD-10-CM

## 2018-01-08 LAB — POCT INR: INR: 2.3

## 2018-01-08 NOTE — Patient Instructions (Signed)
INR today 2.3  Continue to take 1 tablet daily except 1/2 tablet on Monday and Fridays.  Re-check in 4-6 weeks.  Patient is doing well without any changes to diet, health or medications.  Wife verbalizes understanding of all instructions given today.

## 2018-01-10 ENCOUNTER — Ambulatory Visit: Payer: PPO

## 2018-02-19 ENCOUNTER — Ambulatory Visit (INDEPENDENT_AMBULATORY_CARE_PROVIDER_SITE_OTHER): Payer: PPO

## 2018-02-19 DIAGNOSIS — Z7902 Long term (current) use of antithrombotics/antiplatelets: Secondary | ICD-10-CM

## 2018-02-19 DIAGNOSIS — Z7901 Long term (current) use of anticoagulants: Secondary | ICD-10-CM

## 2018-02-19 LAB — POCT INR: INR: 2

## 2018-02-19 NOTE — Patient Instructions (Signed)
INR today 2.0  Continue to take 1 tablet daily except 1/2 tablet on Monday and Fridays.  Re-check in 4-6 weeks.  Patient is doing well without any changes to diet, health or medications.  Wife verbalizes understanding of all instructions given today.

## 2018-02-19 NOTE — Progress Notes (Signed)
[facility]  PATIENT: Logan Valdez. DOB: 02-06-1946   REASON FOR VISIT: Follow-up for dementia and history of stroke with risk factors of diabetes hypertension hyperlipidemia. 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 He has risk factors of diabetes and hypertension obesity and hyperlipidemia. At the time of his stroke he was found to have a PFO. He elected not to have it closed.   Update 07/15/2015 PS: He returns for follow-up today complaint by his wife. The patient was unable to afford Namenda XR. 28 mg and hence switched back to Namenda 10 mg twice daily. He is tolerating it well but wife feels subjectively maybe a little bit worse. He continues to have memory and cognitive difficulties but under wife's supervision he does fine. He is mainly bothered by severe low back pain but has not been considered a surgical candidate. He has limited mobility and does not exercise a lot but has lost some weight approximately 13 pounds. He goes to pain management He remains on warfarin which is tolerating well with only minor bruising and no significant bleeding episodes. He had carotid ultrasound done on 07/15/14 show 50-69% left ICA stenosis. He has had no recurrent stroke or TIA symptoms. He is tolerating Lipitor well without any side effects. Last lipid profile was in February . 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 He has risk factors of diabetes and hypertension obesity and hyperlipidemia. He also has a vascular dementia and is currently on Namenda twice daily. His memory is stable. He continues to go to pain management. He is on warfarin. Last carotid ultrasound in September 2015 shows 50-69% left ICA stenosis. He  needs repeat. He has not had recurrent stroke or TIA symptoms. Blood  pressure in good control 120/84 in the  office. Lipids are followed by his primary care and he remains on Lipitor. No falls since last seen. He returns for reevaluation UPDATE 9/8/17CM Mr Valdez, 72 year old male returns for follow-up with his history of stroke event in October 2009 resulting in vascular dementia and is currently on Namenda twice daily. His memory is stable. He has risk factors of diabetes and hypertension obesity and hyperlipidemia. He also has chronic back pain and goes to pain management. Repeat carotid Doppler after last visit in March was unchanged from previous Doppler in September 2015. He has not had recurrent stroke or TIA symptoms. He is presently on Coumadin. Minimal bruising noted on his hands and forearms. Blood pressure mildly elevated in the office today at 144/72. He remains on Lipitor for hyperlipidemia. He denies any myalgias or muscle pain. His labs are followed by primary care. He has not fallen. He returns for reevaluation Update 7/5/2018PS : He returns for follow-up after last visit 9 months ago. He is accompanied by his wife. He was hospitalized recently with episode of confusion and was found to have fever and upper respiratory tract infection. He is also found to have COPD exacerbation was treated with antibiotics and steroids. He has improved though he still has some mild occasional nocturnal cough. The wife reports that he is had further cognitive worsening and requires more help with activities of daily living. His Mini-Mental status score testing today scored 13/30 which is lower than 18/30 at last visit. Is currently  on Namenda 10 mg twice daily and is tolerating well. He has never tried a higher dose of Namenda yet. UPDATE 10/17/2018[name] Valdez, 72 year old male returns for follow-up history of stroke event in 2009 resulting in vascular dementia. He is currently on Coumadin for secondary  stroke prevention without further stroke or TIA symptoms. He had hospitalization in June for upper respiratory tract infection memory worsened after that time. Wife feels it is stable today. He continues to require more assistance with ADLs. MMSE today is 13 out of 30. He was ordered Namenda extended release at his last visit however it appears that was never called in by Dr. [name]. Patient remains on Namenda 10 mg twice daily. Blood pressure the office today 162/72. He remains on Lipitor without myalgias. He has a long history of back pain and gets little exercise. Labs continue to be followed by his primary care provider. Recent CBC and CMP reviewed from 07/17/17. Due to complaints of dizziness his Metoprolol  was decreased to half tablet daily and his gabapentin was decreased to twice a day by PCP Dr. Sharen Hones. He returns for reevaluation UPDATE 4/17/2019CM Mr. 73, 72 year old male returns for follow-up with his wife who is his caregiver.  He has a history of stroke event in 2009 resulting in vascular dementia.  He remains on Coumadin for secondary stroke prevention without further stroke or TIA symptoms.  His memory has continued to decline.  MMSE today 11 out of 30.  He was switched to Namenda XR at his last visit.  Patient is becoming more dependent on his wife and he does not want her to leave his side.  He requires more assistance with his activities of daily living.  Appetite is good.  Unfortunately he is sleeping during the day so he is awake at night.  She was encouraged to try to keep him from napping during the day so that he can sleep at night.  He also needs to exercise.  Wife is also taking care of her mother who recently broke her hip.  He returns for reevaluation REVIEW OF SYSTEMS: Full 14 system review of systems performed and notable only for those listed, all others are neg:  Constitutional: Fatigue Cardiovascular: neg Ear/Nose/Throat: neg  Skin: neg Eyes: neg Respiratory:  neg Gastroitestinal: Urinary frequency Hematology/Lymphatic: neg  Endocrine: neg Musculoskeletal:neg Allergy/Immunology: neg Neurological: Memory loss Psychiatric: Confusion  Sleep : neg   ALLERGIES: Allergies  Allergen Reactions  . Hydrochlorothiazide Other (See Comments)    Mental changes   . Indomethacin     syncope  . Pravastatin Sodium     REACTION: rash 11/09  . Lisinopril     REACTION: HA  . Micardis [Telmisartan]     REACTION: HA    HOME MEDICATIONS: Outpatient Medications Prior to Visit  Medication Sig Dispense Refill  . acetaminophen (TYLENOL) 500 MG tablet Take 500 mg by mouth 3 (three) times daily.    Marland Kitchen amLODipine (NORVASC) 2.5 MG tablet TAKE 1 TABLET BY MOUTH DAILY 90 tablet 0  . atorvastatin (LIPITOR) 40 MG tablet Take 1 tablet (40 mg total) by mouth daily. 90 tablet 1  . cyanocobalamin 500 MCG tablet Take 500 mcg by mouth daily. Vitamin B12    . dextromethorphan (DELSYM) 30 MG/5ML liquid Take 15-60 mg by mouth 2 (two) times daily as needed for cough.    . docusate sodium (COLACE) 100 MG capsule Take 100 mg by mouth daily as needed for mild constipation.    . finasteride (PROSCAR) 5  MG tablet TAKE 1 TABLET BY MOUTH DAILY 30 tablet 2  . FOLIC ACID PO Take 3 tablets by mouth 2 (two) times daily.    Marland Kitchen gabapentin (NEURONTIN) 300 MG capsule TAKE ONE CAPSULE BY MOUTH THREE TIMES DAILY 270 capsule 3  . guaiFENesin (MUCINEX) 600 MG 12 hr tablet Take 600-1,200 mg by mouth 2 (two) times daily as needed for cough or to loosen phlegm.    Marland Kitchen HYDROcodone-acetaminophen (NORCO) 10-325 MG per tablet Take 1 tablet by mouth every 6 (six) hours as needed for moderate pain.     Marland Kitchen ipratropium-albuterol (DUONEB) 0.5-2.5 (3) MG/3ML SOLN Take 3 mLs by nebulization every 2 (two) hours as needed. 360 mL 1  . memantine (NAMENDA XR) 28 MG CP24 24 hr capsule Take 1 capsule (28 mg total) by mouth daily. 30 capsule 6  . metoprolol tartrate (LOPRESSOR) 25 MG tablet Take 0.5 tablets (12.5 mg  total) by mouth 2 (two) times daily.    . polyethylene glycol powder (GLYCOLAX/MIRALAX) powder DISSOLVE 1 CAPFUL (17GM) INTO 4 TO 8 OUNCES OF FLUID AND TAKE BY MOUTH ONCE DAILY AS DIRECTED 255 g 0  . Pyridoxine HCl (VITAMIN B-6 PO) Take 1 tablet by mouth 2 (two) times daily.    Marland Kitchen triamcinolone cream (KENALOG) 0.1 % Apply 1 application topically 2 (two) times daily. Apply to AA. (Patient taking differently: Apply 1 application topically 2 (two) times daily as needed (rash). Apply to AA.) 30 g 0  . warfarin (COUMADIN) 5 MG tablet TAKE AS DIRECTED BY THE COUMADIN CLINIC 90 tablet 1  . vitamin B-12 (CYANOCOBALAMIN) 1000 MCG tablet Take 0.5 tablets (500 mcg total) by mouth daily.     No facility-administered medications prior to visit.     PAST MEDICAL HISTORY: Past Medical History:  Diagnosis Date  . Chronic lower back pain    [place]  . DDD (degenerative disc disease), lumbar   . Dysmetabolic syndrome X   . Gout, unspecified   . History of DVT (deep vein thrombosis)   . History of pulmonary embolism   . History of stroke 2009   [place]  . History of thrombophlebitis   . HTN (hypertension)   . Memory loss   . Mixed hyperlipidemia   . Obesity   . PFO (patent foramen ovale)   . Primary hypercoagulable state (HCC) 05/14/2008   Personal h/o DVT, PE, PFO, and lupus AC +   . Stroke (HCC)   . Type II or unspecified type diabetes mellitus without mention of complication, uncontrolled   . Unspecified sleep apnea     PAST SURGICAL HISTORY: Past Surgical History:  Procedure Laterality Date  . COLONOSCOPY  2000   [facility]  . WRIST SURGERY     Right wrist repair post trauma    FAMILY HISTORY: Family History  Problem Relation Age of Onset  . Emphysema Father   . Diabetes Brother   . Hypertension Brother   . Heart attack Brother 43  . Skin cancer Brother   . Lung cancer Brother   . Cancer Maternal Aunt        ? Type   . Stroke Neg Hx     SOCIAL HISTORY: Social History    Socioeconomic History  . Marital status: Married    Spouse name: Not on file  . Number of children: 4  . Years of education: 8 th  . Highest education level: Not on file  Occupational History  . Not on file  Social Needs  .  Financial resource strain: Not on file  . Food insecurity:    Worry: Not on file    Inability: Not on file  . Transportation needs:    Medical: Not on file    Non-medical: Not on file  Tobacco Use  . Smoking status: Never Smoker  . Smokeless tobacco: Never Used  Substance and Sexual Activity  . Alcohol use: No    Alcohol/week: 0.0 oz  . Drug use: No  . Sexual activity: Not Currently  Lifestyle  . Physical activity:    Days per week: Not on file    Minutes per session: Not on file  . Stress: Not on file  Relationships  . Social connections:    Talks on phone: Not on file    Gets together: Not on file    Attends religious service: Not on file    Active member of club or organization: Not on file    Attends meetings of clubs or organizations: Not on file    Relationship status: Not on file  . Intimate partner violence:    Fear of current or ex partner: Not on file    Emotionally abused: Not on file    Physically abused: Not on file    Forced sexual activity: Not on file  Other Topics Concern  . Not on file  Social History Narrative   Patient is married with 4 children.   Patient is right handed.   Patient has 8 th grade education.   Patient drinks 4-5 cups daily.     PHYSICAL EXAM  Vitals:   02/20/18 1309  BP: (!) 157/69  Pulse: (!) 51  Weight: 253 lb (114.8 kg)  Height: 5\' 5"  (1.651 m)   Body mass index is 42.1 kg/m.  Generalized: Well developed, Obese male in no acute distress  Head: normocephalic and atraumatic,. Oropharynx benign  Neck: Supple, no carotid bruits  Cardiac: Regular rate rhythm, no murmur  Musculoskeletal: No deformity   Neurological examination   Mentation: Alert , AFT 3 unable to draw a clock MMSE - Mini  Mental State Exam 02/20/2018 01/02/2018 08/22/2017  Not completed: - (No Data) -  Orientation to time 1 - 4  Orientation to Place 2 - 2  Registration 3 - 3  Attention/ Calculation 0 - 0  Recall 0 - 0  Language- name 2 objects 2 - 1  Language- repeat 1 - 1  Language- follow 3 step command 2 - 2  Language- read & follow direction 0 - 0  Write a sentence 0 - 0  Copy design 0 - 0  Total score 11 - 13   Follows most  commands speech and language fluent.   Cranial nerve II-XII: Pupils were equal round reactive to light extraocular movements were full, visual field were full on confrontational test. Facial sensation and strength were normal. hearing was intact to finger rubbing bilaterally. Uvula tongue midline. head turning and shoulder shrug were normal and symmetric.Tongue protrusion into cheek strength was normal. Motor: normal bulk and tone, full strength in the BUE, BLE,  Sensory: normal and symmetric to light touch,   Coordination: finger-nose-finger,  dysmetria unable to perform heel to shin Reflexes: Symmetric upper and lower plantar responses were flexor bilaterally. Gait and Station: Rising up from seated position with push off  wide based steady gait. No assistive device  DIAGNOSTIC DATA (LABS, IMAGING, TESTING) - I reviewed patient records, labs, notes, testing and imaging myself where available.  Lab Results  Component Value Date  WBC 6.3 12/25/2017   HGB 14.0 12/25/2017   HCT 41.9 12/25/2017   MCV 90.3 12/25/2017   PLT 171.0 12/25/2017      Component Value Date/Time   NA 140 12/25/2017 1150   K 4.3 12/25/2017 1150   CL 104 12/25/2017 1150   CO2 30 12/25/2017 1150   GLUCOSE 96 12/25/2017 1150   BUN 13 12/25/2017 1150   CREATININE 0.96 12/25/2017 1150   CALCIUM 9.3 12/25/2017 1150   PROT 6.7 01/02/2018 1126   ALBUMIN 3.8 01/02/2018 1126   AST 20 01/02/2018 1126   ALT 17 01/02/2018 1126   ALKPHOS 55 01/02/2018 1126   BILITOT 0.8 01/02/2018 1126   GFRNONAA >60  02/19/2017 0415   GFRAA >60 02/19/2017 0415   Lab Results  Component Value Date   CHOL 194 01/02/2018   HDL 52.00 01/02/2018   LDLCALC 120 (H) 01/02/2018   LDLDIRECT 114.0 12/25/2017   TRIG 112.0 01/02/2018   CHOLHDL 4 01/02/2018   Lab Results  Component Value Date   HGBA1C 5.9 12/25/2017   Lab Results  Component Value Date   VITAMINB12 >1500 (H) 01/02/2018   Lab Results  Component Value Date   TSH 1.01 12/25/2017      ASSESSMENT AND PLAN 72 y.o. year old male has a past medical history of Obesity; Dysmetabolic syndrome X; Unspecified sleep apnea; Mixed hyperlipidemia; Type II or unspecified type diabetes mellitus without mention of complication, uncontrolled; History of pulmonary embolism; History of DVT (deep vein thrombosis); History of thrombophlebitis; HTN (hypertension); History of stroke (2009); PFO (patent foramen ovale); Chronic lower back pain; DDD (degenerative disc disease), lumbar; and Primary hypercoagulable state (05/14/2008). here to follow-up for memory loss.The patient is a current patient of Dr. [name] who is out of the office today . This note is sent to the work in doctor.    PLAN: Continue  Namenda to 28 mg daily XR will refill  Continue warfarin  for secondary stroke prevention  Strict control of hypertension with blood pressure goal below 130/90 todays reading 157/69. lipids with LDL cholesterol goal below 70 mg percent. Continue Lipitor No napping  during the day, encourage more activity Follow up in 8 months for repeat MMSE Need to ask for more help from family so that the wife does not get completely worn out from caring for him 24/7 and also her mother. I spent 25 min in total face to face time with the patient more than 50% of which was spent counseling and coordination of care, reviewing test results reviewing medications and discussing and reviewing the diagnosis of dementia, it is a progressive disease strategies that may help her a  structured environment and routine, avoid multitasking.  Also need to have a Plan B in case something happens to the wife, need to be thinking about long-term planning Nilda Riggs, [facility], [facility], APRN  [facility] [address], [address] [place], [place] [address] [phone]

## 2018-02-20 ENCOUNTER — Ambulatory Visit (INDEPENDENT_AMBULATORY_CARE_PROVIDER_SITE_OTHER): Payer: PPO | Admitting: Nurse Practitioner

## 2018-02-20 ENCOUNTER — Encounter: Payer: Self-pay | Admitting: Nurse Practitioner

## 2018-02-20 VITALS — BP 157/69 | HR 51 | Ht 65.0 in | Wt 253.0 lb

## 2018-02-20 DIAGNOSIS — Z8673 Personal history of transient ischemic attack (TIA), and cerebral infarction without residual deficits: Secondary | ICD-10-CM

## 2018-02-20 DIAGNOSIS — Q2112 Patent foramen ovale: Secondary | ICD-10-CM

## 2018-02-20 DIAGNOSIS — E782 Mixed hyperlipidemia: Secondary | ICD-10-CM

## 2018-02-20 DIAGNOSIS — F0150 Vascular dementia without behavioral disturbance: Secondary | ICD-10-CM

## 2018-02-20 DIAGNOSIS — Q211 Atrial septal defect: Secondary | ICD-10-CM | POA: Diagnosis not present

## 2018-02-20 DIAGNOSIS — I10 Essential (primary) hypertension: Secondary | ICD-10-CM

## 2018-02-20 MED ORDER — MEMANTINE HCL ER 28 MG PO CP24: 28.0000 mg | ORAL_CAPSULE | Freq: Every day | ORAL | 8 refills | Status: DC

## 2018-02-20 NOTE — Patient Instructions (Signed)
Continue  Namenda to 28 mg daily XR will refill  Continue warfarin  for secondary stroke prevention  Strict control of hypertension with blood pressure goal below 130/90 todays reading 157/69. lipids with LDL cholesterol goal below 70 mg percent. Continue Lipitor No napping  during the day, encourage more activity Follow up in 8 months for repeat MMSE

## 2018-02-20 NOTE — Progress Notes (Signed)
I have reviewed and agreed above plan. 

## 2018-03-15 ENCOUNTER — Telehealth: Payer: Self-pay | Admitting: Family Medicine

## 2018-03-15 MED ORDER — AMLODIPINE BESYLATE 2.5 MG PO TABS: 2.5000 mg | ORAL_TABLET | Freq: Every day | ORAL | 1 refills | Status: DC

## 2018-03-15 NOTE — Telephone Encounter (Signed)
Copied from CRM [id number]. Topic: Quick Communication - Rx Refill/Question >> Mar 15, 2018 11:28 AM Eston Mould B wrote: Medication: amLODipine (NORVASC) 2.5 MG tablet   Has the patient contacted their pharmacy? No  (Agent: If no, request that the patient contact the pharmacy for the refill.)  Preferred Pharmacy (with phone number or street name): [facility] [address] [place] - [address] [phone] (Phone) [phone] (Fax)      Agent: Please be advised that RX refills may take up to 3 business days. We ask that you follow-up with your pharmacy.

## 2018-03-26 ENCOUNTER — Ambulatory Visit (INDEPENDENT_AMBULATORY_CARE_PROVIDER_SITE_OTHER): Payer: PPO

## 2018-03-26 DIAGNOSIS — Z7902 Long term (current) use of antithrombotics/antiplatelets: Secondary | ICD-10-CM

## 2018-03-26 DIAGNOSIS — Z7901 Long term (current) use of anticoagulants: Secondary | ICD-10-CM

## 2018-03-26 LAB — POCT INR: INR: 1.3 — AB (ref 2.0–3.0)

## 2018-03-26 NOTE — Patient Instructions (Signed)
INR today 1.3  *Wife doesn't think he missed any doses.  Has started back on amlodipine after a brief period of not taking.  Take 1.5 pills (7.5mg ) today (5/21) and tomorrow (5/22) and then Continue to take 1 tablet daily except 1/2 tablet on Monday and Fridays.  Re-check in 2 weeks.  Wife verbalizes understanding of all instructions given today.

## 2018-03-28 ENCOUNTER — Other Ambulatory Visit: Payer: Self-pay | Admitting: Family Medicine

## 2018-03-28 MED ORDER — WARFARIN SODIUM 5 MG PO TABS: ORAL_TABLET | ORAL | 1 refills | Status: DC

## 2018-03-28 MED ORDER — METOPROLOL TARTRATE 25 MG PO TABS: 12.5000 mg | ORAL_TABLET | Freq: Two times a day (BID) | ORAL | 1 refills | Status: DC

## 2018-03-28 NOTE — Telephone Encounter (Signed)
Patient is compliant with coumadin management, will refill X 6 months.   

## 2018-03-28 NOTE — Telephone Encounter (Signed)
I spoke with Logan Valdez and verified Logan Valdez is taking metoprolol tartrate 25 mg taking 1/2 tab bid. Logan Valdez said that was correct. Logan Valdez last annual 01/07/18.refilled metoprolol tartrate 25 mg per protocol. [facility] [address]. Logan Valdez will ck with pharmacy later today about picking up refills. FYI to [facility] RN for warfarin refill.

## 2018-03-28 NOTE — Telephone Encounter (Signed)
LOV  01/07/18 Dr. Sharen Hones Coumadin last refill 08/07/17  # 90 with 1 refill Metoprolol   -  Provider's name not on medication list.

## 2018-03-28 NOTE — Telephone Encounter (Signed)
Copied from CRM [id number]. Topic: Quick Communication - See Telephone Encounter >> Mar 28, 2018 11:28 AM Logan Valdez wrote: Pt is needing a refill on metoprolol and warfarin  [facility] [address]   [place] number [phone]

## 2018-04-02 ENCOUNTER — Telehealth: Payer: Self-pay | Admitting: Nurse Practitioner

## 2018-04-02 NOTE — Telephone Encounter (Addendum)
[facility] pharmacy, spoke with [facility] who stated memantine 28 mg is $90/ 3 months for patient.  Prescription is ready to be picked up. Called wife and gave her cost. She stated she had misunderstood, thought $90 was for one month. She stated she would go pick it up today, verbalzied appreciation for call.

## 2018-04-02 NOTE — Telephone Encounter (Signed)
Patient's wife is calling stating memantine (NAMENDA XR) 28 MG CP24 24 hr capsule is too expensive. Can patient go back on Memantine HCL . [facility] uses [facility] [address] in [place]. Please call to advise.

## 2018-04-09 ENCOUNTER — Ambulatory Visit (INDEPENDENT_AMBULATORY_CARE_PROVIDER_SITE_OTHER): Payer: PPO

## 2018-04-09 DIAGNOSIS — Z7902 Long term (current) use of antithrombotics/antiplatelets: Secondary | ICD-10-CM

## 2018-04-09 DIAGNOSIS — Z7901 Long term (current) use of anticoagulants: Secondary | ICD-10-CM | POA: Diagnosis not present

## 2018-04-09 LAB — POCT INR: INR: 2 (ref 2.0–3.0)

## 2018-04-09 NOTE — Patient Instructions (Signed)
INR today 2.0  Continue to take 1 tablet daily except 1/2 tablet on Monday and Fridays.  Re-check in 4 weeks.  Wife verbalizes understanding of all instructions given today.

## 2018-04-30 ENCOUNTER — Telehealth: Payer: Self-pay | Admitting: Family Medicine

## 2018-04-30 NOTE — Telephone Encounter (Signed)
Copied from CRM [id number]. Topic: Quick Communication - Rx Refill/Question >> Apr 30, 2018 12:27 PM Mickel BaasMcGee, Dysen Edmondson B, NT wrote: Medication: polyethylene glycol powder (GLYCOLAX/MIRALAX) powder  Has the patient contacted their pharmacy? Yes.   (Agent: If no, request that the patient contact the pharmacy for the refill.) (Agent: If yes, when and what did the pharmacy advise?)  [facility]erred Pharm[facility] (with phone number or street name[facility] [place], [place] - [address]  Agent: Please be advised that RX refills may take up to 3 business days. We ask that you follow-up with your pharmacy.

## 2018-05-01 ENCOUNTER — Ambulatory Visit (INDEPENDENT_AMBULATORY_CARE_PROVIDER_SITE_OTHER): Payer: PPO | Admitting: Family Medicine

## 2018-05-01 ENCOUNTER — Encounter: Payer: Self-pay | Admitting: Family Medicine

## 2018-05-01 VITALS — BP 126/80 | HR 59 | Temp 99.0°F | Ht 65.0 in | Wt 240.2 lb

## 2018-05-01 DIAGNOSIS — R351 Nocturia: Secondary | ICD-10-CM | POA: Diagnosis not present

## 2018-05-01 LAB — POC URINALSYSI DIPSTICK (AUTOMATED)
Bilirubin, UA: NEGATIVE
Glucose, UA: NEGATIVE
Ketones, UA: NEGATIVE
Leukocytes, UA: NEGATIVE
NITRITE UA: NEGATIVE
Protein, UA: NEGATIVE
RBC UA: NEGATIVE
UROBILINOGEN UA: 1 U/dL
pH, UA: 6 (ref 5.0–8.0)

## 2018-05-01 MED ORDER — POLYETHYLENE GLYCOL 3350 17 GM/SCOOP PO POWD: ORAL | 0 refills | Status: DC

## 2018-05-01 NOTE — Patient Instructions (Addendum)
Urine looking ok today. Symptoms may be coming from enlarged prostate - restart finasteride Call me after 2 weeks - let me know how this helps

## 2018-05-01 NOTE — Assessment & Plan Note (Addendum)
Increased nocturia noted without other significant UTI sxs. UA today without signs of infection. Possibly BPH related - reviewing med list - wife states she had stopped finasteride recently - unsure if it was still necessary. Advised restart this medication. Anticipate this will help. Update with effect after a few weeks.

## 2018-05-01 NOTE — Progress Notes (Signed)
BP 126/80 (BP Location: Left Arm, Patient Position: Sitting, Cuff Size: Large)   Pulse (!) 59   Temp 99 F (37.2 C) (Oral)   Ht 5\' 5"  (1.651 m)   Wt 240 lb 4 oz (109 kg)   SpO2 96%   BMI 39.98 kg/m    CC: increased urinary frequency, fatigue  Subjective:    Patient ID: Logan FlankEdward C Yerian Jr., male    DOB: 1946/05/16, 72 y.o.   MRN: [id number]  HPI: Logan Flankdward C Kumari Jr. is a 72 y.o. male presenting on 05/01/2018 for Fatigue (Feels sleepy all time, worsened. ) and Urinary Frequency (Started about 4 mos ago. )   Wife is caregiver - h/o vascular dementia.   Noticed increased nocturia (on the hour), no increase in daytime frequency, urgency or dysuria. Denies blood in urine, wife thinks it looks darker. Denies fevers/chills, nausea, vomiting, abd pain, flank pain.  No recent abx use.  Increased somnolence, increased confusion recently.   Using lift chair at home.   Relevant past medical, surgical, family and social history reviewed and updated as indicated. Interim medical history since our last visit reviewed. Allergies and medications reviewed and updated. Outpatient Medications Prior to Visit  Medication Sig Dispense Refill  . acetaminophen (TYLENOL) 500 MG tablet Take 500 mg by mouth 3 (three) times daily.    Marland Kitchen. amLODipine (NORVASC) 2.5 MG tablet Take 1 tablet (2.5 mg total) by mouth daily. 90 tablet 1  . atorvastatin (LIPITOR) 40 MG tablet Take 1 tablet (40 mg total) by mouth daily. 90 tablet 1  . cyanocobalamin 500 MCG tablet Take 500 mcg by mouth daily. Vitamin B12    . dextromethorphan (DELSYM) 30 MG/5ML liquid Take 15-60 mg by mouth 2 (two) times daily as needed for cough.    . docusate sodium (COLACE) 100 MG capsule Take 100 mg by mouth daily as needed for mild constipation.    . finasteride (PROSCAR) 5 MG tablet TAKE 1 TABLET BY MOUTH DAILY 30 tablet 2  . FOLIC ACID PO Take 3 tablets by mouth 2 (two) times daily.    Marland Kitchen. gabapentin (NEURONTIN) 300 MG capsule TAKE ONE  CAPSULE BY MOUTH THREE TIMES DAILY 270 capsule 3  . guaiFENesin (MUCINEX) 600 MG 12 hr tablet Take 600-1,200 mg by mouth 2 (two) times daily as needed for cough or to loosen phlegm.    Marland Kitchen. HYDROcodone-acetaminophen (NORCO) 10-325 MG per tablet Take 1 tablet by mouth every 6 (six) hours as needed for moderate pain.     Marland Kitchen. ipratropium-albuterol (DUONEB) 0.5-2.5 (3) MG/3ML SOLN Take 3 mLs by nebulization every 2 (two) hours as needed. 360 mL 1  . memantine (NAMENDA XR) 28 MG CP24 24 hr capsule Take 1 capsule (28 mg total) by mouth daily. 30 capsule 8  . metoprolol tartrate (LOPRESSOR) 25 MG tablet Take 0.5 tablets (12.5 mg total) by mouth 2 (two) times daily. 90 tablet 1  . polyethylene glycol powder (GLYCOLAX/MIRALAX) powder DISSOLVE 1 CAPFUL (17GM) INTO 4 TO 8 OUNCES OF FLUID AND TAKE BY MOUTH ONCE DAILY AS DIRECTED 255 g 0  . Pyridoxine HCl (VITAMIN B-6 PO) Take 1 tablet by mouth 2 (two) times daily.    Marland Kitchen. triamcinolone cream (KENALOG) 0.1 % Apply 1 application topically 2 (two) times daily. Apply to AA. (Patient taking differently: Apply 1 application topically 2 (two) times daily as needed (rash). Apply to AA.) 30 g 0  . warfarin (COUMADIN) 5 MG tablet TAKE AS DIRECTED BY THE COUMADIN CLINIC 90 tablet  1   No facility-administered medications prior to visit.      Per HPI unless specifically indicated in ROS section below Review of Systems     Objective:    BP 126/80 (BP Location: Left Arm, Patient Position: Sitting, Cuff Size: Large)   Pulse (!) 59   Temp 99 F (37.2 C) (Oral)   Ht 5\' 5"  (1.651 m)   Wt 240 lb 4 oz (109 kg)   SpO2 96%   BMI 39.98 kg/m   Wt Readings from Last 3 Encounters:  05/01/18 240 lb 4 oz (109 kg)  02/20/18 253 lb (114.8 kg)  01/07/18 247 lb (112 kg)    Physical Exam  Constitutional: He appears well-developed and well-nourished. No distress.  HENT:  Mouth/Throat: Oropharynx is clear and moist. No oropharyngeal exudate.  Cardiovascular: Normal rate and regular  rhythm.  Murmur (3/6 systolic murmur) heard. Pulmonary/Chest: Effort normal and breath sounds normal. No respiratory distress. He has no wheezes. He has no rales.  Abdominal: Soft. Bowel sounds are normal. He exhibits no distension and no mass. There is no hepatosplenomegaly. There is tenderness (mild) in the right upper quadrant. There is no rigidity, no rebound, no guarding and no CVA tenderness. No hernia.  Musculoskeletal: He exhibits no edema.  Nursing note and vitals reviewed.  Results for orders placed or performed in visit on 04/09/18  POCT INR  Result Value Ref Range   INR 2.0 2.0 - 3.0      Assessment & Plan:   Problem List Items Addressed This Visit    Nocturia - Primary    Increased nocturia noted without other significant UTI sxs. UA today without signs of infection. Possibly BPH related - reviewing med list - wife states she had stopped finasteride recently - unsure if it was still necessary. Advised restart this medication. Anticipate this will help. Update with effect after a few weeks.           No orders of the defined types were placed in this encounter.  No orders of the defined types were placed in this encounter.   Follow up plan: Return if symptoms worsen or fail to improve.  Eustaquio Boyden, MD

## 2018-05-01 NOTE — Addendum Note (Signed)
Addended by: Nanci PinaGOINS, Kenya Kook on: 05/01/2018 05:23 PM   Modules accepted: Orders

## 2018-05-03 ENCOUNTER — Telehealth: Payer: Self-pay | Admitting: Family Medicine

## 2018-05-03 NOTE — Telephone Encounter (Signed)
Copied from CRM [id number]. Topic: Quick Communication - Rx Refill/Question >> May 03, 2018 11:54 AM Alexander BergeronBarksdale, Harvey B wrote: Medication: finasteride (PROSCAR) 5 MG tablet [[id number]][[id number]]   Has the patient contacted their pharmacy? Yes.   (Agent: If no, request that the patient contact the pharmacy for the refill.) (Agent: If yes, when and what did the pharmacy advise?)  Preferred Pharmacy (with phone number or street name): [facility]  Agent: Please be advised that RX refills may take up to 3 business days. We ask that you follow-up with your pharmacy.

## 2018-05-04 MED ORDER — FINASTERIDE 5 MG PO TABS: 5.0000 mg | ORAL_TABLET | Freq: Every day | ORAL | 2 refills | Status: DC

## 2018-05-07 ENCOUNTER — Ambulatory Visit (INDEPENDENT_AMBULATORY_CARE_PROVIDER_SITE_OTHER): Payer: PPO

## 2018-05-07 DIAGNOSIS — Z7901 Long term (current) use of anticoagulants: Secondary | ICD-10-CM

## 2018-05-07 DIAGNOSIS — Z7902 Long term (current) use of antithrombotics/antiplatelets: Secondary | ICD-10-CM

## 2018-05-07 LAB — POCT INR: INR: 2.6 (ref 2.0–3.0)

## 2018-05-07 NOTE — Patient Instructions (Signed)
INR today 2.6  Continue to take 1 tablet daily except 1/2 tablet on Monday and Fridays.  Re-check in 4 weeks.  Wife verbalizes understanding of all instructions given today.  Patient is doing well without any changes to diet, health or medications.

## 2018-05-23 ENCOUNTER — Telehealth: Payer: Self-pay | Admitting: Nurse Practitioner

## 2018-05-23 MED ORDER — DIVALPROEX SODIUM 125 MG PO CSDR: 125.0000 mg | DELAYED_RELEASE_CAPSULE | Freq: Two times a day (BID) | ORAL | 4 refills | Status: DC

## 2018-05-23 NOTE — Telephone Encounter (Signed)
She can try Depakote sprinkles twice a day this can be placed in food or swallowed RX to pharmacy.

## 2018-05-23 NOTE — Telephone Encounter (Signed)
Returned call to wife[place]  [name] who stated her husband is so anxious, nervous over everything. She has difficulty calming him down at times. She continues to care for elderly mother as well. She stated she is trying to get things in order for him, but for now she has to care for him at home.  She is able to keep him awake more during the day now. She is requesting advise or medication to help him be more calm. She has not discussed this with his PCP. This RN advised will discuss with NP and call her back. She verbalized understanding, appreciation.

## 2018-05-23 NOTE — Telephone Encounter (Signed)
Spoke with wife, [name] and advised her of new prescription. Advised she may sprinkle on food if necessary. Advised she call for any problems or questions. She verbalized understanding, appreciation.

## 2018-05-23 NOTE — Telephone Encounter (Signed)
Pts wife(Inex) requesting a call to discuss the pts current situation , did not wish to discuss further with me

## 2018-05-28 NOTE — Telephone Encounter (Addendum)
Spoke with wife who stated she gave patient depakote sprinkles three days, but she stopped. She stated he complained he couldn't breath at one point, would say I can't wake up and I can't move.  She has not given him Depakote since Sun morning, and she said he's better today. She also stated his PCP said not to give him Hydrocodone, but she gave him one for his back pain. She stated "it made him crazy".  This RN advised she never give it to him again but give him Tylenol as needed. She stated his PCP told her to give him Tylenol for pain, and she does. She stated it helps.  This RN advised she continue working on getting him placed or getting more help at home for him and her. She stated she has been.  Advised her that at this point the NP will most likely not prescribe a new medication. Advised the Depakote needs to get out of his system, and he get back to baseline. Wife stated he needs something to calm him down. This RN advised again that the NP will most likely wait, not prescribe a new med at this time. Advised her many meds for calming can cause sedation, dizziness, more confusion, make him vulnerable to falls. Advised she call back in a few days to update on his condition. She verbalized understanding, appreciation.

## 2018-05-28 NOTE — Telephone Encounter (Signed)
Pt requesting a from RN stating she has been having more difficulty with the pt. Logan Valdez did not wish to go into detail with me.

## 2018-05-31 ENCOUNTER — Ambulatory Visit (INDEPENDENT_AMBULATORY_CARE_PROVIDER_SITE_OTHER): Payer: PPO | Admitting: Family Medicine

## 2018-05-31 ENCOUNTER — Encounter: Payer: Self-pay | Admitting: Family Medicine

## 2018-05-31 VITALS — BP 130/84 | HR 62 | Temp 98.6°F | Ht 65.0 in | Wt 241.0 lb

## 2018-05-31 DIAGNOSIS — F0150 Vascular dementia without behavioral disturbance: Secondary | ICD-10-CM

## 2018-05-31 DIAGNOSIS — T887XXA Unspecified adverse effect of drug or medicament, initial encounter: Secondary | ICD-10-CM

## 2018-05-31 DIAGNOSIS — R451 Restlessness and agitation: Secondary | ICD-10-CM

## 2018-05-31 NOTE — Progress Notes (Signed)
   Subjective:    Patient ID: Logan FlankEdward C Clendenen Jr., male    DOB: May 08, 1946, 72 y.o.   MRN: [id number]  HPI   72 year old male pt of Dr. Reece AgarG with complicated medical history including chronic back pain and vascular dementia presents with recent onset anxiety in last week.  Wife reports today that she gave him a hydrocodone last week.. Back was hurting. ( stopped given may have been causing confusion and hallucination in past.  Started taking depakote last week as well. Next day he seemed different.. Trying to get awake but could not get up... Kind of like sleep paralysis. He kept saying "wake me up", he seemed scared.  Unfortunately wife is a poor historian.  Last OV with PCP 05/01/2018 Had noted increase nocturia, increase somnolence and increased confusion lately. Restarted finasteride  He had been taking hydrocodone for chronic back pain .Marland Kitchen. Had stopped because it was causing confusion.   Vascular dementia followed by neurologist. On namenda. Started depakote for anxiety.  Wife stopped Depakote  In last 3-4 days and he has seen some better.    Blood pressure 130/84, pulse 62, temperature 98.6 F (37 C), temperature source Oral, height 5\' 5"  (1.651 m), weight 241 lb (109.3 kg), SpO2 97 %.   Review of Systems  Constitutional: Negative for fatigue and fever.  HENT: Negative for ear pain.   Eyes: Negative for pain.  Respiratory: Negative for cough and shortness of breath.   Cardiovascular: Negative for chest pain, palpitations and leg swelling.  Gastrointestinal: Negative for abdominal pain.  Genitourinary: Negative for dysuria.  Musculoskeletal: Negative for arthralgias.  Neurological: Negative for syncope, light-headedness and headaches.  Psychiatric/Behavioral: Negative for dysphoric mood.       Objective:   Physical Exam  Constitutional: Vital signs are normal. He appears well-developed and well-nourished.  Overweight male minimally talkative  HENT:  Head: Normocephalic.    Right Ear: Hearing normal.  Left Ear: Hearing normal.  Nose: Nose normal.  Mouth/Throat: Oropharynx is clear and moist and mucous membranes are normal.  Neck: Trachea normal. Carotid bruit is not present. No thyroid mass and no thyromegaly present.  Cardiovascular: Normal rate, regular rhythm and normal pulses. Exam reveals no gallop, no distant heart sounds and no friction rub.  No murmur heard. No peripheral edema  Pulmonary/Chest: Effort normal and breath sounds normal. No respiratory distress.  Neurological: He has normal strength. He is disoriented. No cranial nerve deficit or sensory deficit.  Skin: Skin is warm, dry and intact. No rash noted.  Psychiatric: He has a normal mood and affect. His speech is normal and behavior is normal. Thought content normal.          Assessment & Plan:

## 2018-05-31 NOTE — Assessment & Plan Note (Signed)
Pt with likely agitation due to dementia.  Difficult to understand story from wife but appears depakote started for agitation.. But  Cause SE or possible SE to using hydrocodone one dose ( he has had issues with it in past)   No clear new infectious  Cause seen or need for labs.  Recommended follow up with PCP or neurologist on further treatment of agitation.

## 2018-05-31 NOTE — Patient Instructions (Addendum)
Stay off depakote for now. Stop any hydrocodone.. Use tylenol instead for pain.   Call for earlier follow up with neurology or Dr. Reece AgarG if agitation  other issues continuing  off those medications.  No clear injection or other secondary cause of symptoms today.

## 2018-05-31 NOTE — Assessment & Plan Note (Signed)
Not clearly new. Vitals stable. Complicated medical history. I do not feel comfortable starting an anxiolytic in this complicated patient.. Refer to PCP pr neurology for further recommendations.

## 2018-06-04 ENCOUNTER — Ambulatory Visit (INDEPENDENT_AMBULATORY_CARE_PROVIDER_SITE_OTHER): Payer: PPO

## 2018-06-04 DIAGNOSIS — Z7901 Long term (current) use of anticoagulants: Secondary | ICD-10-CM

## 2018-06-04 DIAGNOSIS — Z7902 Long term (current) use of antithrombotics/antiplatelets: Secondary | ICD-10-CM

## 2018-06-04 LAB — POCT INR: INR: 2.6 (ref 2.0–3.0)

## 2018-06-04 NOTE — Patient Instructions (Signed)
INR today 2.6  Continue to take 1 tablet daily except 1/2 tablet on Monday and Fridays.  Re-check in 6 weeks.  Wife verbalizes understanding of all instructions given today.  Patient is doing well without any changes in diet, health and medications.

## 2018-07-06 ENCOUNTER — Other Ambulatory Visit: Payer: Self-pay | Admitting: Family Medicine

## 2018-07-16 ENCOUNTER — Ambulatory Visit (INDEPENDENT_AMBULATORY_CARE_PROVIDER_SITE_OTHER): Payer: PPO

## 2018-07-16 DIAGNOSIS — Z7902 Long term (current) use of antithrombotics/antiplatelets: Secondary | ICD-10-CM

## 2018-07-16 DIAGNOSIS — Z7901 Long term (current) use of anticoagulants: Secondary | ICD-10-CM

## 2018-07-16 LAB — POCT INR: INR: 1.8 — AB (ref 2.0–3.0)

## 2018-07-16 NOTE — Patient Instructions (Signed)
INR today 1.8  Take 1.5 pills (7.5mg ) today 9/10 and then Continue to take 1 tablet daily except 1/2 tablet on Monday and Fridays.  Re-check in 4 weeks.  Patient is eating extra cantaloupe right now which may be contributing to subtherapeutic level.  Patient/wife know to limit consumption of cantaloupe through the season and will see how he is running at next check before determining need to increase the dose.   Wife verbalizes understanding of all instructions given today.

## 2018-08-20 ENCOUNTER — Ambulatory Visit (INDEPENDENT_AMBULATORY_CARE_PROVIDER_SITE_OTHER): Payer: PPO

## 2018-08-20 DIAGNOSIS — Z23 Encounter for immunization: Secondary | ICD-10-CM | POA: Diagnosis not present

## 2018-08-20 DIAGNOSIS — Z7901 Long term (current) use of anticoagulants: Secondary | ICD-10-CM

## 2018-08-20 DIAGNOSIS — Z7902 Long term (current) use of antithrombotics/antiplatelets: Secondary | ICD-10-CM

## 2018-08-20 LAB — POCT INR: INR: 2.4 (ref 2.0–3.0)

## 2018-08-20 NOTE — Patient Instructions (Signed)
INR today 2.4  Continue to take 1 tablet daily except 1/2 tablet on Monday and Fridays.  Re-check in 4 weeks.  Patient is doing well without changes to diet, health or medications.  Wife verbalizes understanding of all instructions given today.

## 2018-09-09 ENCOUNTER — Other Ambulatory Visit: Payer: Self-pay | Admitting: Family Medicine

## 2018-09-17 ENCOUNTER — Ambulatory Visit (INDEPENDENT_AMBULATORY_CARE_PROVIDER_SITE_OTHER): Payer: PPO

## 2018-09-17 DIAGNOSIS — Z7901 Long term (current) use of anticoagulants: Secondary | ICD-10-CM

## 2018-09-17 DIAGNOSIS — Z7902 Long term (current) use of antithrombotics/antiplatelets: Secondary | ICD-10-CM

## 2018-09-17 LAB — POCT INR: INR: 2.4 (ref 2.0–3.0)

## 2018-09-17 NOTE — Patient Instructions (Signed)
INR today 2.4  Continue to take 1 tablet daily except 1/2 tablet on Monday and Fridays.  Re-check in 5-6 weeks.  Patient is doing well without changes to diet, health or medications.  Wife verbalizes understanding of all instructions given today.

## 2018-09-19 ENCOUNTER — Encounter: Payer: Self-pay | Admitting: Family Medicine

## 2018-09-19 ENCOUNTER — Ambulatory Visit (INDEPENDENT_AMBULATORY_CARE_PROVIDER_SITE_OTHER): Payer: PPO | Admitting: Family Medicine

## 2018-09-19 VITALS — BP 122/74 | HR 64 | Temp 98.7°F | Ht 65.0 in | Wt 236.5 lb

## 2018-09-19 DIAGNOSIS — R451 Restlessness and agitation: Secondary | ICD-10-CM

## 2018-09-19 DIAGNOSIS — N481 Balanitis: Secondary | ICD-10-CM | POA: Diagnosis not present

## 2018-09-19 DIAGNOSIS — N5089 Other specified disorders of the male genital organs: Secondary | ICD-10-CM | POA: Diagnosis not present

## 2018-09-19 DIAGNOSIS — F0150 Vascular dementia without behavioral disturbance: Secondary | ICD-10-CM

## 2018-09-19 DIAGNOSIS — R400 Somnolence: Secondary | ICD-10-CM

## 2018-09-19 LAB — POC URINALSYSI DIPSTICK (AUTOMATED)
Bilirubin, UA: NEGATIVE
Blood, UA: NEGATIVE
Glucose, UA: NEGATIVE
KETONES UA: NEGATIVE
Leukocytes, UA: NEGATIVE
Nitrite, UA: NEGATIVE
PROTEIN UA: NEGATIVE
Urobilinogen, UA: 0.2 E.U./dL
pH, UA: 6 (ref 5.0–8.0)

## 2018-09-19 MED ORDER — LORAZEPAM 0.5 MG PO TABS: 0.2500 mg | ORAL_TABLET | Freq: Every day | ORAL | 0 refills | Status: DC | PRN

## 2018-09-19 MED ORDER — CLOTRIMAZOLE 1 % EX CREA
1.0000 | TOPICAL_CREAM | Freq: Two times a day (BID) | CUTANEOUS | 0 refills | Status: DC
Start: 2018-09-19 — End: 2020-01-09

## 2018-09-19 MED ORDER — GABAPENTIN 300 MG PO CAPS
300.0000 mg | ORAL_CAPSULE | Freq: Every day | ORAL | 1 refills | Status: DC
Start: 2018-09-19 — End: 2018-12-23

## 2018-09-19 NOTE — Assessment & Plan Note (Addendum)
Wife requests medication for intermittent anxiety/agitation. Consider SSRI. Discussed risks of benzo including increased falls, sedation - will Rx low dose ativan to try. Continue namenda XR 28mg  daily.

## 2018-09-19 NOTE — Assessment & Plan Note (Signed)
Anticipate fungal/candidal balanitis given itch > tenderness. Overall improving with desitin use. rec clotrimazole BID x1 wk. Update if persistent. UA today normal, concentrated - rec increase water intake.

## 2018-09-19 NOTE — Assessment & Plan Note (Signed)
Will decrease gabapentin dose.

## 2018-09-19 NOTE — Assessment & Plan Note (Signed)
See above. Trial low dose benzo after discussing risks of medication with pt/wife.

## 2018-09-19 NOTE — Assessment & Plan Note (Addendum)
Overall benign exam today - anticipate dependent scrotal edema may be from fluid retention. Encouraged leg elevation when seated and regular activity, consider scrotal elevation.

## 2018-09-19 NOTE — Progress Notes (Signed)
BP 122/74 (BP Location: Left Arm, Patient Position: Sitting, Cuff Size: Large)   Pulse 64   Temp 98.7 F (37.1 C) (Oral)   Ht 5\' 5"  (1.651 m)   Wt 236 lb 8 oz (107.3 kg)   SpO2 98%   BMI 39.36 kg/m    CC: check rash at penis Subjective:    Patient ID: Logan Flank., male    DOB: 07-09-1946, 72 y.o.   MRN: [id number]  HPI: Logan Mehlhoff. is a 72 y.o. male presenting on 09/19/2018 for Penis Pain (C/o redness at head of penis and bilateral testicle swelling. Also, c/o urinary frequency. Sxs started last week. Pt accompanied by his wife, Logan Valdez. )   1 wk h/o tender redness around penis, itchy > tenderness. Wife has treated with desitin with benefit.  Scrotal swelling has been present for several months - comes and goes.  Increased confusion noted as well.   No fevers/chills, nausea, dysuria, bowel changes.   Finasteride started 04/2018 for presumed BPH symptoms - this has helped some but continued nocturia up to 4x/night.   Only taking 2 gabapentin a day.   Relevant past medical, surgical, family and social history reviewed and updated as indicated. Interim medical history since our last visit reviewed. Allergies and medications reviewed and updated. Outpatient Medications Prior to Visit  Medication Sig Dispense Refill  . acetaminophen (TYLENOL) 500 MG tablet Take 500 mg by mouth 3 (three) times daily.    Marland Kitchen amLODipine (NORVASC) 2.5 MG tablet Take 1 tablet (2.5 mg total) by mouth daily. 90 tablet 1  . atorvastatin (LIPITOR) 40 MG tablet TAKE 1 TABLET BY MOUTH ONCE DAILY 90 tablet 1  . cyanocobalamin 500 MCG tablet Take 500 mcg by mouth daily. Vitamin B12    . dextromethorphan (DELSYM) 30 MG/5ML liquid Take 15-60 mg by mouth 2 (two) times daily as needed for cough.    . docusate sodium (COLACE) 100 MG capsule Take 100 mg by mouth daily as needed for mild constipation.    . finasteride (PROSCAR) 5 MG tablet TAKE 1 TABLET BY MOUTH ONCE DAILY 30 tablet 2  . FOLIC ACID  PO Take 3 tablets by mouth 2 (two) times daily.    Marland Kitchen guaiFENesin (MUCINEX) 600 MG 12 hr tablet Take 600-1,200 mg by mouth 2 (two) times daily as needed for cough or to loosen phlegm.    Marland Kitchen ipratropium-albuterol (DUONEB) 0.5-2.5 (3) MG/3ML SOLN Take 3 mLs by nebulization every 2 (two) hours as needed. 360 mL 1  . memantine (NAMENDA XR) 28 MG CP24 24 hr capsule Take 1 capsule (28 mg total) by mouth daily. 30 capsule 8  . metoprolol tartrate (LOPRESSOR) 25 MG tablet Take 0.5 tablets (12.5 mg total) by mouth 2 (two) times daily. 90 tablet 1  . polyethylene glycol powder (GLYCOLAX/MIRALAX) powder DISSOLVE 1 CAPFUL (17GM) INTO 4 TO 8 OUNCES OF FLUID AND TAKE BY MOUTH ONCE DAILY AS DIRECTED 255 g 0  . Pyridoxine HCl (VITAMIN B-6 PO) Take 1 tablet by mouth 2 (two) times daily.    Marland Kitchen triamcinolone cream (KENALOG) 0.1 % Apply 1 application topically 2 (two) times daily. Apply to AA. (Patient taking differently: Apply 1 application topically 2 (two) times daily as needed (rash). Apply to AA.) 30 g 0  . warfarin (COUMADIN) 5 MG tablet TAKE AS DIRECTED BY THE COUMADIN CLINIC 90 tablet 1  . gabapentin (NEURONTIN) 300 MG capsule TAKE ONE CAPSULE BY MOUTH THREE TIMES DAILY 270 capsule 3  .  divalproex (DEPAKOTE SPRINKLE) 125 MG capsule Take 1 capsule (125 mg total) by mouth 2 (two) times daily. (Patient not taking: Reported on 05/28/2018) 60 capsule 4  . HYDROcodone-acetaminophen (NORCO) 10-325 MG per tablet Take 1 tablet by mouth every 6 (six) hours as needed for moderate pain.      No facility-administered medications prior to visit.      Per HPI unless specifically indicated in ROS section below Review of Systems     Objective:    BP 122/74 (BP Location: Left Arm, Patient Position: Sitting, Cuff Size: Large)   Pulse 64   Temp 98.7 F (37.1 C) (Oral)   Ht 5\' 5"  (1.651 m)   Wt 236 lb 8 oz (107.3 kg)   SpO2 98%   BMI 39.36 kg/m   Wt Readings from Last 3 Encounters:  09/19/18 236 lb 8 oz (107.3 kg)    05/31/18 241 lb (109.3 kg)  05/01/18 240 lb 4 oz (109 kg)    Physical Exam  Constitutional: He appears well-developed and well-nourished. No distress.  Genitourinary: Testes normal. Right testis shows no mass, no swelling and no tenderness. Right testis is descended. Left testis shows no mass, no swelling and no tenderness. Left testis is descended. Uncircumcised. Penile erythema (mild erythema to dorsal head) present. No phimosis or paraphimosis. No discharge found.  Genitourinary Comments: Overall normal testicular exam today  Musculoskeletal: He exhibits no edema.  Nursing note and vitals reviewed.  Results for orders placed or performed in visit on 09/19/18  POCT Urinalysis Dipstick (Automated)  Result Value Ref Range   Color, UA gold    Clarity, UA clear    Glucose, UA Negative Negative   Bilirubin, UA negative    Ketones, UA negative    Spec Grav, UA >=1.030 (A) 1.010 - 1.025   Blood, UA negative    pH, UA 6.0 5.0 - 8.0   Protein, UA Negative Negative   Urobilinogen, UA 0.2 0.2 or 1.0 E.U./dL   Nitrite, UA negative    Leukocytes, UA Negative Negative      Assessment & Plan:   Problem List Items Addressed This Visit    Vascular dementia [facility])    Wife requests medication for intermittent anxiety/agitation. Consider SSRI. Discussed risks of benzo including increased falls, sedation - will Rx low dose ativan to try. Continue namenda XR 28mg  daily.      Relevant Medications   gabapentin (NEURONTIN) 300 MG capsule   LORazepam (ATIVAN) 0.5 MG tablet   Scrotal swelling    Overall benign exam today - anticipate dependent scrotal edema may be from fluid retention. Encouraged leg elevation when seated and regular activity, consider scrotal elevation.       Relevant Orders   POCT Urinalysis Dipstick (Automated) (Completed)   Daytime somnolence    Will decrease gabapentin dose.       Balanitis - Primary    Anticipate fungal/candidal balanitis given itch > tenderness.  Overall improving with desitin use. rec clotrimazole BID x1 wk. Update if persistent. UA today normal, concentrated - rec increase water intake.       Relevant Orders   POCT Urinalysis Dipstick (Automated) (Completed)   Agitation    See above. Trial low dose benzo after discussing risks of medication with pt/wife.           Meds ordered this encounter  Medications  . clotrimazole (LOTRIMIN) 1 % cream    Sig: Apply 1 application topically 2 (two) times daily.    Dispense:  30  g    Refill:  0  . gabapentin (NEURONTIN) 300 MG capsule    Sig: Take 1 capsule (300 mg total) by mouth at bedtime. With second dose if needed    Dispense:  100 capsule    Refill:  1  . LORazepam (ATIVAN) 0.5 MG tablet    Sig: Take 0.5-1 tablets (0.25-0.5 mg total) by mouth daily as needed for anxiety.    Dispense:  20 tablet    Refill:  0   Orders Placed This Encounter  Procedures  . POCT Urinalysis Dipstick (Automated)    Follow up plan: No follow-ups on file.  Eustaquio BoydenJavier Kamyla Olejnik, MD

## 2018-09-19 NOTE — Patient Instructions (Addendum)
You likely have fungal infection of penis (balanitis) - treat with clotrimazole cream twice daily to affected area.  Urine tested today ok but concentrated - increase water by at least 2 glasses a day.  Scrotal swelling may be from fluid building up - stay active.  Change gabapentin to 300mg  nightly, with second dose during the day if needed.  May try ativan 0.5mg  1/2 tablet as needed for anxiety/agitation.

## 2018-10-21 NOTE — Progress Notes (Signed)
[facility]  PATIENT: Logan Valdez. DOB: 02-06-1946   REASON FOR VISIT: Follow-up for dementia and history of stroke with risk factors of diabetes hypertension hyperlipidemia. 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 He has risk factors of diabetes and hypertension obesity and hyperlipidemia. At the time of his stroke he was found to have a PFO. He elected not to have it closed.   Update 07/15/2015 PS: He returns for follow-up today complaint by his wife. The patient was unable to afford Namenda XR. 28 mg and hence switched back to Namenda 10 mg twice daily. He is tolerating it well but wife feels subjectively maybe a little bit worse. He continues to have memory and cognitive difficulties but under wife's supervision he does fine. He is mainly bothered by severe low back pain but has not been considered a surgical candidate. He has limited mobility and does not exercise a lot but has lost some weight approximately 13 pounds. He goes to pain management He remains on warfarin which is tolerating well with only minor bruising and no significant bleeding episodes. He had carotid ultrasound done on 07/15/14 show 50-69% left ICA stenosis. He has had no recurrent stroke or TIA symptoms. He is tolerating Lipitor well without any side effects. Last lipid profile was in February . Blood pressure is mildly elevated in the office today 142/78 UPDATE 3/8/17[name]. 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 He has risk factors of diabetes and hypertension obesity and hyperlipidemia. He also has a vascular dementia and is currently on Namenda twice daily. His memory is stable. He continues to go to pain management. He is on warfarin. Last carotid ultrasound in September 2015 shows 50-69% left ICA stenosis. He  needs repeat. He has not had recurrent stroke or TIA symptoms. Blood  pressure in good control 120/84 in the  office. Lipids are followed by his primary care and he remains on Lipitor. No falls since last seen. He returns for reevaluation UPDATE 9/8/17[name] Valdez, 72 year old male returns for follow-up with his history of stroke event in October 2009 resulting in vascular dementia and is currently on Namenda twice daily. His memory is stable. He has risk factors of diabetes and hypertension obesity and hyperlipidemia. He also has chronic back pain and goes to pain management. Repeat carotid Doppler after last visit in March was unchanged from previous Doppler in September 2015. He has not had recurrent stroke or TIA symptoms. He is presently on Coumadin. Minimal bruising noted on his hands and forearms. Blood pressure mildly elevated in the office today at 144/72. He remains on Lipitor for hyperlipidemia. He denies any myalgias or muscle pain. His labs are followed by primary care. He has not fallen. He returns for reevaluation Update 7/5/2018PS : He returns for follow-up after last visit 9 months ago. He is accompanied by his wife. He was hospitalized recently with episode of confusion and was found to have fever and upper respiratory tract infection. He is also found to have COPD exacerbation was treated with antibiotics and steroids. He has improved though he still has some mild occasional nocturnal cough. The wife reports that he is had further cognitive worsening and requires more help with activities of daily living. His Mini-Mental status score testing today scored 13/30 which is lower than 18/30 at last visit. Is currently  on Namenda 10 mg twice daily and is tolerating well. He has never tried a higher dose of Namenda yet. UPDATE 10/17/2018[name] Valdez, 72 year old male returns for follow-up history of stroke event in 2009 resulting in vascular dementia. He is currently on Coumadin for secondary  stroke prevention without further stroke or TIA symptoms. He had hospitalization in June for upper respiratory tract infection memory worsened after that time. Wife feels it is stable today. He continues to require more assistance with ADLs. MMSE today is 13 out of 30. He was ordered Namenda extended release at his last visit however it appears that was never called in by Dr. Pearlean BrownieSethi. Patient remains on Namenda 10 mg twice daily. Blood pressure the office today 162/72. He remains on Lipitor without myalgias. He has a long history of back pain and gets little exercise. Labs continue to be followed by his primary care provider. Recent CBC and CMP reviewed from 07/17/17. Due to complaints of dizziness his Metoprolol  was decreased to half tablet daily and his gabapentin was decreased to twice a day by PCP Dr. Sharen HonesGutierrez. He returns for reevaluation UPDATE 4/17/2019[name] Valdez, 72 year old male returns for follow-up with his wife who is his caregiver.  He has a history of stroke event in 2009 resulting in vascular dementia.  He remains on Coumadin for secondary stroke prevention without further stroke or TIA symptoms.  His memory has continued to decline.  MMSE today 11 out of 30.  He was switched to Namenda XR at his last visit.  Patient is becoming more dependent on his wife and he does not want her to leave his side.  He requires more assistance with his activities of daily living.  Appetite is good.  Unfortunately he is sleeping during the day so he is awake at night.  She was encouraged to try to keep him from napping during the day so that he can sleep at night.  He also needs to exercise.  Wife is also taking care of her mother who recently broke her hip.  He returns for reevaluation. UPDATE 12/17/2019[name] Valdez, 72 year old male returns for follow-up with his wife who is his caregiver 24/7.  He has a history of vascular dementia resulting from a stroke in 2009.  He is on Coumadin for secondary stroke  prevention without further stroke or TIA symptoms.  His memory has to continue to decline.  MMSE today 12 out of 30 last 11 out of 30.  He remains on Namenda XR 28 mg daily.  Wife complains that he has difficulty waking up some mornings.  He takes gabapentin at night.  She was made aware that that medication can cause  Drowsiness.  She had called into the office in July due to increased agitation and Depakote was given.  Unfortunately he took that for 3 days and had more confusion and agitation however what she did not tell me until today was that he was on Neurontin, Norco, and Ativan all of which can cause confusion.  She also takes care of her elderly mother who has a broken hip.  He continues to require more assistance with his activities of daily living.  He does not want to be left alone.  Unfortunately she does not get much help from her family.  He returns for reevaluation  REVIEW OF SYSTEMS: Full 14 system review of systems performed and notable only for those listed, all others are neg:  Constitutional: Fatigue Cardiovascular: neg Ear/Nose/Throat: neg  Skin: neg Eyes:  Loss of vision Respiratory: neg Gastroitestinal: Urinary frequency Hematology/Lymphatic: neg  Endocrine: neg Musculoskeletal:neg Allergy/Immunology: neg Neurological: Memory loss, speech difficulty Psychiatric: Confusion , anxiety Sleep : neg   ALLERGIES: Allergies  Allergen Reactions  . Hydrochlorothiazide Other (See Comments)    Mental changes   . Indomethacin     syncope  . Pravastatin Sodium     REACTION: rash 11/09  . Lisinopril     REACTION: HA  . Micardis [Telmisartan]     REACTION: HA    HOME MEDICATIONS: Outpatient Medications Prior to Visit  Medication Sig Dispense Refill  . acetaminophen (TYLENOL) 500 MG tablet Take 500 mg by mouth 3 (three) times daily.    Marland Kitchen amLODipine (NORVASC) 2.5 MG tablet Take 1 tablet (2.5 mg total) by mouth daily. 90 tablet 1  . atorvastatin (LIPITOR) 40 MG tablet TAKE  1 TABLET BY MOUTH ONCE DAILY 90 tablet 1  . clotrimazole (LOTRIMIN) 1 % cream Apply 1 application topically 2 (two) times daily. 30 g 0  . cyanocobalamin 500 MCG tablet Take 500 mcg by mouth daily. Vitamin B12    . dextromethorphan (DELSYM) 30 MG/5ML liquid Take 15-60 mg by mouth 2 (two) times daily as needed for cough.    . docusate sodium (COLACE) 100 MG capsule Take 100 mg by mouth daily as needed for mild constipation.    . finasteride (PROSCAR) 5 MG tablet TAKE 1 TABLET BY MOUTH ONCE DAILY 30 tablet 2  . FOLIC ACID PO Take 3 tablets by mouth 2 (two) times daily.    Marland Kitchen gabapentin (NEURONTIN) 300 MG capsule Take 1 capsule (300 mg total) by mouth at bedtime. With second dose if needed 100 capsule 1  . guaiFENesin (MUCINEX) 600 MG 12 hr tablet Take 600-1,200 mg by mouth 2 (two) times daily as needed for cough or to loosen phlegm.    Marland Kitchen ipratropium-albuterol (DUONEB) 0.5-2.5 (3) MG/3ML SOLN Take 3 mLs by nebulization every 2 (two) hours as needed. 360 mL 1  . LORazepam (ATIVAN) 0.5 MG tablet Take 0.5-1 tablets (0.25-0.5 mg total) by mouth daily as needed for anxiety. 20 tablet 0  . memantine (NAMENDA XR) 28 MG CP24 24 hr capsule Take 1 capsule (28 mg total) by mouth daily. 30 capsule 8  . metoprolol tartrate (LOPRESSOR) 25 MG tablet Take 0.5 tablets (12.5 mg total) by mouth 2 (two) times daily. 90 tablet 1  . polyethylene glycol powder (GLYCOLAX/MIRALAX) powder DISSOLVE 1 CAPFUL (17GM) INTO 4 TO 8 OUNCES OF FLUID AND TAKE BY MOUTH ONCE DAILY AS DIRECTED 255 g 0  . Pyridoxine HCl (VITAMIN B-6 PO) Take 1 tablet by mouth 2 (two) times daily.    Marland Kitchen triamcinolone cream (KENALOG) 0.1 % Apply 1 application topically 2 (two) times daily. Apply to AA. (Patient taking differently: Apply 1 application topically 2 (two) times daily as needed (rash). Apply to AA.) 30 g 0  . warfarin (COUMADIN) 5 MG tablet TAKE AS DIRECTED BY THE COUMADIN CLINIC 90 tablet 1  . divalproex (DEPAKOTE SPRINKLE) 125 MG capsule Take 1  capsule (125 mg total) by mouth 2 (two) times daily. (Patient not taking: Reported on 05/28/2018) 60 capsule 4  . HYDROcodone-acetaminophen (NORCO) 10-325 MG per tablet Take 1 tablet by mouth every 6 (six) hours as needed for moderate pain.      No facility-administered medications prior to visit.     PAST MEDICAL HISTORY: Past Medical History:  Diagnosis Date  . Chronic lower back pain    Ramos  . DDD (  degenerative disc disease), lumbar   . Dysmetabolic syndrome X   . Gout, unspecified   . History of DVT (deep vein thrombosis)   . History of pulmonary embolism   . History of stroke 2009   Sethi  . History of thrombophlebitis   . HTN (hypertension)   . Memory loss   . Mixed hyperlipidemia   . Obesity   . PFO (patent foramen ovale)   . Primary hypercoagulable state (HCC) 05/14/2008   Personal h/o DVT, PE, PFO, and lupus AC +   . Stroke (HCC)   . Type II or unspecified type diabetes mellitus without mention of complication, uncontrolled   . Unspecified sleep apnea     PAST SURGICAL HISTORY: Past Surgical History:  Procedure Laterality Date  . COLONOSCOPY  2000   [facility]  . WRIST SURGERY     Right wrist repair post trauma    FAMILY HISTORY: Family History  Problem Relation Age of Onset  . Emphysema Father   . Diabetes Brother   . Hypertension Brother   . Heart attack Brother 43  . Skin cancer Brother   . Lung cancer Brother   . Cancer Maternal Aunt        ? Type   . Stroke Neg Hx     SOCIAL HISTORY: Social History   Socioeconomic History  . Marital status: Married    Spouse name: Not on file  . Number of children: 4  . Years of education: 8 th  . Highest education level: Not on file  Occupational History  . Not on file  Social Needs  . Financial resource strain: Not on file  . Food insecurity:    Worry: Not on file    Inability: Not on file  . Transportation needs:    Medical: Not on file    Non-medical: Not on file  Tobacco Use  . Smoking  status: Never Smoker  . Smokeless tobacco: Never Used  Substance and Sexual Activity  . Alcohol use: No    Alcohol/week: 0.0 standard drinks  . Drug use: No  . Sexual activity: Not Currently  Lifestyle  . Physical activity:    Days per week: Not on file    Minutes per session: Not on file  . Stress: Not on file  Relationships  . Social connections:    Talks on phone: Not on file    Gets together: Not on file    Attends religious service: Not on file    Active member of club or organization: Not on file    Attends meetings of clubs or organizations: Not on file    Relationship status: Not on file  . Intimate partner violence:    Fear of current or ex partner: Not on file    Emotionally abused: Not on file    Physically abused: Not on file    Forced sexual activity: Not on file  Other Topics Concern  . Not on file  Social History Narrative   Patient is married with 4 children.   Patient is right handed.   Patient has 8 th grade education.   Patient drinks 4-5 cups daily.     PHYSICAL EXAM  Vitals:   10/22/18 1319  BP: 127/71  Pulse: (!) 57  Weight: 238 lb 12.8 oz (108.3 kg)  Height: 5\' 5"  (1.651 m)   Body mass index is 39.74 kg/m.  Generalized: Well developed, Obese male in no acute distress  Head: normocephalic and atraumatic,. Oropharynx benign  Neck: Supple, no carotid bruits  Cardiac: Regular rate rhythm, no murmur  Musculoskeletal: No deformity   Neurological examination   Mentation: Alert  MMSE - Mini Mental State Exam 10/22/2018 02/20/2018 01/02/2018  Not completed:  - (No Data)  Orientation to time 3 1 -  Orientation to Place 1 2 -  Registration 3 3 -  Attention/ Calculation 0 0 -  Recall 0 0 -  Language- name 2 objects 2 2 -  Language- repeat 1 1 -  Language- follow 3 step command 1 2 -  Language- read & follow direction 1 0 -  Write a sentence 0 0 -  Copy design 0 0 -  Total score 12 11 -  ,  Follows most  commands speech  Slow and language  fluent.   Cranial nerve II-XII: Pupils were equal round reactive to light extraocular movements were full, visual field were full on confrontational test. Facial sensation and strength were normal. hearing was intact to finger rubbing bilaterally. Uvula tongue midline. head turning and shoulder shrug were normal and symmetric.Tongue protrusion into cheek strength was normal. Motor: normal bulk and tone, full strength in the BUE, BLE,  Sensory: normal and symmetric to light touch,   Coordination: finger-nose-finger,  dysmetria unable to perform heel to shin, apraxia with the use of extremities Reflexes: Symmetric upper and lower plantar responses were flexor bilaterally. Gait and Station: Rising up from seated position with push off  wide based  Mildly unsteady gait. No assistive device  DIAGNOSTIC DATA (LABS, IMAGING, TESTING) - I reviewed patient records, labs, notes, testing and imaging myself where available.  Lab Results  Component Value Date   WBC 6.3 12/25/2017   HGB 14.0 12/25/2017   HCT 41.9 12/25/2017   MCV 90.3 12/25/2017   PLT 171.0 12/25/2017      Component Value Date/Time   NA 140 12/25/2017 1150   K 4.3 12/25/2017 1150   CL 104 12/25/2017 1150   CO2 30 12/25/2017 1150   GLUCOSE 96 12/25/2017 1150   BUN 13 12/25/2017 1150   CREATININE 0.96 12/25/2017 1150   CALCIUM 9.3 12/25/2017 1150   PROT 6.7 01/02/2018 1126   ALBUMIN 3.8 01/02/2018 1126   AST 20 01/02/2018 1126   ALT 17 01/02/2018 1126   ALKPHOS 55 01/02/2018 1126   BILITOT 0.8 01/02/2018 1126   GFRNONAA >60 02/19/2017 0415   GFRAA >60 02/19/2017 0415   Lab Results  Component Value Date   CHOL 194 01/02/2018   HDL 52.00 01/02/2018   LDLCALC 120 (H) 01/02/2018   LDLDIRECT 114.0 12/25/2017   TRIG 112.0 01/02/2018   CHOLHDL 4 01/02/2018   Lab Results  Component Value Date   HGBA1C 5.9 12/25/2017   Lab Results  Component Value Date   VITAMINB12 >1500 (H) 01/02/2018   Lab Results  Component Value  Date   TSH 1.01 12/25/2017      ASSESSMENT AND PLAN 72 y.o. year old male has a past medical history of Obesity; Dysmetabolic syndrome X; Unspecified sleep apnea; Mixed hyperlipidemia; Type II or unspecified type diabetes mellitus without mention of complication, uncontrolled; History of pulmonary embolism; History of DVT (deep vein thrombosis); History of thrombophlebitis; HTN (hypertension); History of stroke (2009); PFO (patent foramen ovale); Chronic lower back pain; DDD (degenerative disc disease), lumbar; and Primary hypercoagulable state (05/14/2008). here to follow-up for memory loss. PLAN: Continue  Namenda to 28 mg daily XR will refill  Continue warfarin  for secondary stroke prevention  Strict control of hypertension  with blood pressure goal below 130/90 todays reading 127/71 lipids with LDL cholesterol goal below 70 mg percent. Continue Lipitor No napping  during the day, encourage more activity Follow up in 8 months for repeat MMSE Restart Depakote 125mg  sprinkle for agitation.If more confusion stop it and we will order Seroquel   Pt is 24/7 care I spent 25 min in total face to face time with the patient more than 50% of which was spent counseling and coordination of care, reviewing test results reviewing medications and discussing and reviewing the diagnosis of dementia, it is a progressive disease strategies that may help her a structured environment and routine, avoid multitasking.  Also need to have a Plan B in case something happens to the wife, need to be thinking about long-term planning Nilda Riggs, [facility], [facility], APRN  [facility] [address] [place], [place] [address] [phone]

## 2018-10-22 ENCOUNTER — Ambulatory Visit: Payer: PPO

## 2018-10-22 ENCOUNTER — Ambulatory Visit (INDEPENDENT_AMBULATORY_CARE_PROVIDER_SITE_OTHER): Payer: PPO | Admitting: Nurse Practitioner

## 2018-10-22 ENCOUNTER — Encounter: Payer: Self-pay | Admitting: Nurse Practitioner

## 2018-10-22 VITALS — BP 127/71 | HR 57 | Ht 65.0 in | Wt 238.8 lb

## 2018-10-22 DIAGNOSIS — I10 Essential (primary) hypertension: Secondary | ICD-10-CM

## 2018-10-22 DIAGNOSIS — F0150 Vascular dementia without behavioral disturbance: Secondary | ICD-10-CM

## 2018-10-22 DIAGNOSIS — Z8673 Personal history of transient ischemic attack (TIA), and cerebral infarction without residual deficits: Secondary | ICD-10-CM

## 2018-10-22 MED ORDER — MEMANTINE HCL ER 28 MG PO CP24: 28.0000 mg | ORAL_CAPSULE | Freq: Every day | ORAL | 8 refills | Status: DC

## 2018-10-22 NOTE — Progress Notes (Signed)
I agree with the above plan 

## 2018-10-22 NOTE — Patient Instructions (Signed)
Continue  Namenda to 28 mg daily XR will refill  Continue warfarin  for secondary stroke prevention  Strict control of hypertension with blood pressure goal below 130/90 todays reading 127/71 lipids with LDL cholesterol goal below 70 mg percent. Continue Lipitor No napping  during the day, encourage more activity Follow up in 8 months for repeat MMSE Restart Depakote 125mg  sprinkle for agitation.If more confusion stop it and we will order Seroquel   Pt is 24/7 care

## 2018-10-24 ENCOUNTER — Ambulatory Visit (INDEPENDENT_AMBULATORY_CARE_PROVIDER_SITE_OTHER): Payer: PPO | Admitting: General Practice

## 2018-10-24 DIAGNOSIS — Z7901 Long term (current) use of anticoagulants: Secondary | ICD-10-CM

## 2018-10-24 DIAGNOSIS — Z7902 Long term (current) use of antithrombotics/antiplatelets: Secondary | ICD-10-CM

## 2018-10-24 LAB — POCT INR: INR: 2.3 (ref 2.0–3.0)

## 2018-10-24 NOTE — Patient Instructions (Addendum)
Pre visit review using our clinic review tool, if applicable. No additional management support is needed unless otherwise documented below in the visit note.  Continue to take 1 tablet daily except 1/2 tablet on Monday and Fridays.  Re-check in 5-6 weeks.

## 2018-11-11 ENCOUNTER — Other Ambulatory Visit: Payer: Self-pay | Admitting: Family Medicine

## 2018-11-11 NOTE — Telephone Encounter (Signed)
E-scribed amlodipine refill.  Fwd to [facility] for warfarin refill.

## 2018-11-12 NOTE — Telephone Encounter (Signed)
Patient is compliant with coumadin management will refill X 6 months.   

## 2018-12-03 ENCOUNTER — Ambulatory Visit: Payer: PPO

## 2018-12-03 ENCOUNTER — Ambulatory Visit (INDEPENDENT_AMBULATORY_CARE_PROVIDER_SITE_OTHER): Payer: PPO

## 2018-12-03 DIAGNOSIS — Z7901 Long term (current) use of anticoagulants: Secondary | ICD-10-CM

## 2018-12-03 DIAGNOSIS — Z7902 Long term (current) use of antithrombotics/antiplatelets: Secondary | ICD-10-CM

## 2018-12-03 LAB — POCT INR: INR: 2.3 (ref 2.0–3.0)

## 2018-12-03 NOTE — Patient Instructions (Signed)
INR today: 2.3  Continue to take 1 tablet daily except 1/2 tablet on Monday and Fridays.  Re-check in 5-6 weeks.  Patient is doing well without any changes to diet, health or medications.  Wife present and verbalizes understanding of all instructions today.  

## 2018-12-23 ENCOUNTER — Other Ambulatory Visit: Payer: Self-pay | Admitting: Family Medicine

## 2018-12-23 NOTE — Telephone Encounter (Signed)
Electronic refill request. Gabapentin Last office visit:   09/19/18  CPE scheduled in early March 2020 Last Filled:    100 capsule 1 09/19/2018   Please advise.

## 2018-12-24 ENCOUNTER — Encounter: Payer: Self-pay | Admitting: Family Medicine

## 2018-12-24 ENCOUNTER — Ambulatory Visit (INDEPENDENT_AMBULATORY_CARE_PROVIDER_SITE_OTHER): Payer: PPO | Admitting: Family Medicine

## 2018-12-24 VITALS — BP 144/92 | HR 82 | Temp 98.2°F | Ht 65.0 in | Wt 226.8 lb

## 2018-12-24 DIAGNOSIS — Z7901 Long term (current) use of anticoagulants: Secondary | ICD-10-CM

## 2018-12-24 DIAGNOSIS — T148XXA Other injury of unspecified body region, initial encounter: Secondary | ICD-10-CM | POA: Diagnosis not present

## 2018-12-24 DIAGNOSIS — F0151 Vascular dementia with behavioral disturbance: Secondary | ICD-10-CM | POA: Diagnosis not present

## 2018-12-24 DIAGNOSIS — F01518 Vascular dementia, unspecified severity, with other behavioral disturbance: Secondary | ICD-10-CM

## 2018-12-24 NOTE — Assessment & Plan Note (Signed)
Reviewed chart and last saw Neurology in 10/2018 and at that time recommended restarting Depakote (had stopped in the fall due to agitation/anxiety) and to call if no improvement as would consider Seroquel. Seems like Wife's concerns regarding behavior are more long-term and less acute. No fever/chills and apart from bleed no other worrisome symptoms or concerns. Advised calling Neurology to discuss long term management of dementia

## 2018-12-24 NOTE — Progress Notes (Signed)
Subjective:     Logan Valdez. is a 73 y.o. male presenting for Dementia (per patient's wife patient has been saying some off things. No urinary issues.)     HPI History provided from wife  #vascular dementia - atypical behavior recently - has been saying he was determined to go the doctor - he was worried about his hand - wife convinced him that they would wait until seeing the doctor and not the ER - in general is having difficulty managing his dementia symptoms - refuses to wear pajamas and wants to wear his jeans - refusing showers - gets mad if she tries to get him to change clothes - saw Dr. Ermalene Searing - and stopped the depakote  - saw Neurology in 10/2018  #hand injury - hit is hand and was bleeding - hit his hand on the wall and caused the scratch - hurt more last night - worried about the blood b/c he is on the blood thinner - thought he was going bleed to death from injury on hand  Review of Systems  Constitutional: Negative for chills and fever.  Gastrointestinal: Negative for blood in stool.  Genitourinary: Negative for difficulty urinating, dysuria and hematuria.  Neurological: Positive for light-headedness (occasionally). Negative for dizziness.     Social History   Tobacco Use  Smoking Status Never Smoker  Smokeless Tobacco Never Used        Objective:    BP Readings from Last 3 Encounters:  12/24/18 (!) 144/92  10/22/18 127/71  09/19/18 122/74   Wt Readings from Last 3 Encounters:  12/24/18 226 lb 12 oz (102.9 kg)  10/22/18 238 lb 12.8 oz (108.3 kg)  09/19/18 236 lb 8 oz (107.3 kg)    BP (!) 144/92   Pulse 82   Temp 98.2 F (36.8 C)   Ht 5\' 5"  (1.651 m)   Wt 226 lb 12 oz (102.9 kg)   BMI 37.73 kg/m    Physical Exam Constitutional:      Appearance: Normal appearance. He is not ill-appearing or diaphoretic.  HENT:     Right Ear: External ear normal.     Left Ear: External ear normal.     Nose: Nose normal.  Eyes:   General: No scleral icterus.    Extraocular Movements: Extraocular movements intact.     Conjunctiva/sclera: Conjunctivae normal.  Neck:     Musculoskeletal: Neck supple.  Cardiovascular:     Rate and Rhythm: Normal rate.  Pulmonary:     Effort: Pulmonary effort is normal.  Skin:    General: Skin is warm and dry.     Comments: Right hand with with small abrasion injury and bruising on the dorsal surface near the thumb. No significant erythema or infectious signs. Small blood at opening but no active bleeding  Neurological:     Mental Status: He is alert. Mental status is at baseline.     Comments: Well dressed.  Psychiatric:        Mood and Affect: Mood normal.     Comments: Answers questions occasionally, though wife answers most questions. Not aggressive. Request to see the coumadin nurse by name.            Assessment & Plan:   Problem List Items Addressed This Visit      Nervous and Auditory   Vascular dementia [facility]) - Primary    Reviewed chart and last saw Neurology in 10/2018 and at that time recommended restarting Depakote (had stopped in  the fall due to agitation/anxiety) and to call if no improvement as would consider Seroquel. Seems like Wife's concerns regarding behavior are more long-term and less acute. No fever/chills and apart from bleed no other worrisome symptoms or concerns. Advised calling Neurology to discuss long term management of dementia        Other   Long term (current) use of anticoagulants    Other Visit Diagnoses    Abrasion of skin         Abrasion w/o active bleeding. Reassurance and re-bandaged    Return if symptoms worsen or fail to improve.  Lynnda Child, MD

## 2018-12-24 NOTE — Patient Instructions (Signed)
#  Skin Abrasion - continue to keep covered until healed  #Dementia and Agitation - Call your Neurologist - They had recommended Restart Depakote at the last appointment or doing Seroquel  - Call to discuss options and consider an earlier appointment

## 2018-12-30 ENCOUNTER — Other Ambulatory Visit: Payer: Self-pay | Admitting: Family Medicine

## 2019-01-08 ENCOUNTER — Other Ambulatory Visit: Payer: Self-pay | Admitting: Family Medicine

## 2019-01-08 ENCOUNTER — Ambulatory Visit: Payer: PPO

## 2019-01-14 ENCOUNTER — Ambulatory Visit (INDEPENDENT_AMBULATORY_CARE_PROVIDER_SITE_OTHER): Payer: PPO

## 2019-01-14 ENCOUNTER — Other Ambulatory Visit: Payer: Self-pay | Admitting: Family Medicine

## 2019-01-14 VITALS — BP 110/60 | HR 59 | Temp 98.5°F | Wt 234.5 lb

## 2019-01-14 DIAGNOSIS — E782 Mixed hyperlipidemia: Secondary | ICD-10-CM | POA: Diagnosis not present

## 2019-01-14 DIAGNOSIS — E538 Deficiency of other specified B group vitamins: Secondary | ICD-10-CM

## 2019-01-14 DIAGNOSIS — Z7902 Long term (current) use of antithrombotics/antiplatelets: Secondary | ICD-10-CM

## 2019-01-14 DIAGNOSIS — E118 Type 2 diabetes mellitus with unspecified complications: Secondary | ICD-10-CM

## 2019-01-14 DIAGNOSIS — M1A9XX0 Chronic gout, unspecified, without tophus (tophi): Secondary | ICD-10-CM

## 2019-01-14 DIAGNOSIS — Z0000 Encounter for general adult medical examination without abnormal findings: Secondary | ICD-10-CM

## 2019-01-14 DIAGNOSIS — Z7901 Long term (current) use of anticoagulants: Secondary | ICD-10-CM

## 2019-01-14 LAB — COMPREHENSIVE METABOLIC PANEL
ALT: 25 U/L (ref 0–53)
AST: 25 U/L (ref 0–37)
Albumin: 4.1 g/dL (ref 3.5–5.2)
Alkaline Phosphatase: 113 U/L (ref 39–117)
BUN: 16 mg/dL (ref 6–23)
CHLORIDE: 104 meq/L (ref 96–112)
CO2: 27 mEq/L (ref 19–32)
Calcium: 9 mg/dL (ref 8.4–10.5)
Creatinine, Ser: 0.77 mg/dL (ref 0.40–1.50)
GFR: 98.93 mL/min (ref >60.00)
GLUCOSE: 89 mg/dL (ref 70–99)
Potassium: 4 mEq/L (ref 3.5–5.1)
Sodium: 141 mEq/L (ref 135–145)
Total Bilirubin: 1.1 mg/dL (ref 0.2–1.2)
Total Protein: 6.7 g/dL (ref 6.0–8.3)

## 2019-01-14 LAB — URIC ACID: Uric Acid, Serum: 5.9 mg/dL (ref 4.0–7.8)

## 2019-01-14 LAB — LIPID PANEL
Cholesterol: 126 mg/dL (ref 0–200)
HDL: 57.6 mg/dL (ref >39.00)
LDL Cholesterol: 51 mg/dL (ref 0–99)
NonHDL: 68.68
Total CHOL/HDL Ratio: 2
Triglycerides: 90 mg/dL (ref 0.0–149.0)
VLDL: 18 mg/dL (ref 0.0–40.0)

## 2019-01-14 LAB — HEMOGLOBIN A1C: Hgb A1c MFr Bld: 5.8 % (ref 4.6–6.5)

## 2019-01-14 LAB — VITAMIN B12: VITAMIN B 12: 1441 pg/mL — AB (ref 211–911)

## 2019-01-14 LAB — POCT INR: INR: 2.3 (ref 2.0–3.0)

## 2019-01-14 NOTE — Patient Instructions (Signed)
Logan Valdez , Thank you for taking time to come for your Medicare Wellness Visit. I appreciate your ongoing commitment to your health goals. Please review the following plan we discussed and let me know if I can assist you in the future.   These are the goals we discussed: Goals    . Follow up with Primary Care Provider     Starting 01/14/2019, I will continue to take medications as prescribed and to keep appointments with PCP as scheduled.        This is a list of the screening recommended for you and due dates:  Health Maintenance  Topic Date Due  . Eye exam for diabetics  09[birth year]01/1955  . DTaP/Tdap/Td vaccine (1 - Tdap) 08/31/1956  . Tetanus Vaccine  08/31/1964  . Complete foot exam   06/21/2016  . Urine Protein Check  01/02/2019  . Colon Cancer Screening  01/02/2049*  . Hemoglobin A1C  07/17/2019  . Flu Shot  Completed  .  Hepatitis C: One time screening is recommended by Center for Disease Control  (CDC) for  adults born from [birth year] through 1966.   Completed  . Pneumonia vaccines  Completed  *Topic was postponed. The date shown is not the original due date.   Preventive Care for Adults  A healthy lifestyle and preventive care can promote health and wellness. Preventive health guidelines for adults include the following key practices.  . A routine yearly physical is a good way to check with your health care provider about your health and preventive screening. It is a chance to share any concerns and updates on your health and to receive a thorough exam.  . Visit your dentist for a routine exam and preventive care every 6 months. Brush your teeth twice a day and floss once a day. Good oral hygiene prevents tooth decay and gum disease.  . The frequency of eye exams is based on your age, health, family medical history, use  of contact lenses, and other factors. Follow your health care provider's recommendations for frequency of eye exams.  . Eat a healthy diet. Foods like  vegetables, fruits, whole grains, low-fat dairy products, and lean protein foods contain the nutrients you need without too many calories. Decrease your intake of foods high in solid fats, added sugars, and salt. Eat the right amount of calories for you. Get information about a proper diet from your health care provider, if necessary.  . Regular physical exercise is one of the most important things you can do for your health. Most adults should get at least 150 minutes of moderate-intensity exercise (any activity that increases your heart rate and causes you to sweat) each week. In addition, most adults need muscle-strengthening exercises on 2 or more days a week.  Silver Sneakers may be a benefit available to you. To determine eligibility, you may visit the website: www.silversneakers.com or contact program at 484-102-1956 Mon-Fri between 8AM-8PM.   . Maintain a healthy weight. The body mass index (BMI) is a screening tool to identify possible weight problems. It provides an estimate of body fat based on height and weight. Your health care provider can find your BMI and can help you achieve or maintain a healthy weight.   For adults 20 years and older: ? A BMI below 18.5 is considered underweight. ? A BMI of 18.5 to 24.9 is normal. ? A BMI of 25 to 29.9 is considered overweight. ? A BMI of 30 and above is considered obese.   Marland Kitchen  Maintain normal blood lipids and cholesterol levels by exercising and minimizing your intake of saturated fat. Eat a balanced diet with plenty of fruit and vegetables. Blood tests for lipids and cholesterol should begin at age 10 and be repeated every 5 years. If your lipid or cholesterol levels are high, you are over 50, or you are at high risk for heart disease, you may need your cholesterol levels checked more frequently. Ongoing high lipid and cholesterol levels should be treated with medicines if diet and exercise are not working.  . If you smoke, find out from your  health care provider how to quit. If you do not use tobacco, please do not start.  . If you choose to drink alcohol, please do not consume more than 2 drinks per day. One drink is considered to be 12 ounces (355 mL) of beer, 5 ounces (148 mL) of wine, or 1.5 ounces (44 mL) of liquor.  . If you are 62-62 years old, ask your health care provider if you should take aspirin to prevent strokes.  . Use sunscreen. Apply sunscreen liberally and repeatedly throughout the day. You should seek shade when your shadow is shorter than you. Protect yourself by wearing long sleeves, pants, a wide-brimmed hat, and sunglasses year round, whenever you are outdoors.  . Once a month, do a whole body skin exam, using a mirror to look at the skin on your back. Tell your health care provider of new moles, moles that have irregular borders, moles that are larger than a pencil eraser, or moles that have changed in shape or color.

## 2019-01-14 NOTE — Patient Instructions (Signed)
INR today: 2.3  Continue to take 1 tablet daily except 1/2 tablet on Monday and Fridays.  Re-check in 5-6 weeks.  Patient is doing well without any changes to diet, health or medications.  Wife present and verbalizes understanding of all instructions today.

## 2019-01-15 ENCOUNTER — Other Ambulatory Visit (INDEPENDENT_AMBULATORY_CARE_PROVIDER_SITE_OTHER): Payer: PPO

## 2019-01-15 ENCOUNTER — Other Ambulatory Visit: Payer: Self-pay

## 2019-01-15 DIAGNOSIS — E118 Type 2 diabetes mellitus with unspecified complications: Secondary | ICD-10-CM

## 2019-01-15 LAB — MICROALBUMIN / CREATININE URINE RATIO
Creatinine,U: 156 mg/dL
Microalb Creat Ratio: 0.7 mg/g (ref 0.0–30.0)
Microalb, Ur: 1 mg/dL (ref 0.0–1.9)

## 2019-01-15 NOTE — Progress Notes (Signed)
PCP notes:   Health maintenance:  Foot exam - PCP follow-up requested Tetanus vaccine - postponed/insurance A1C - completed Microalbumin - spouse will bring in sample  Abnormal screenings:   None  Patient concerns:   None  Nurse concerns:  None  Next PCP appt:   01/22/19 @ 1130

## 2019-01-15 NOTE — Progress Notes (Signed)
Addendum created due to encounter being closed prior to documentation.  

## 2019-01-15 NOTE — Progress Notes (Signed)
Subjective:   Logan Valdez. is a 73 y.o. male who presents for Medicare Annual/Subsequent preventive examination.  Review of Systems:  N/A Cardiac Risk Factors include: advanced age (>83men, >3 women);male gender;obesity (BMI >30kg/m2);diabetes mellitus;dyslipidemia;hypertension     Objective:    Vitals: BP 110/60 (BP Location: Right Arm, Patient Position: Sitting, Cuff Size: Large)   Pulse (!) 59   Temp 98.5 F (36.9 C) (Oral)   Wt 234 lb 8 oz (106.4 kg)   SpO2 97%   BMI 39.02 kg/m   Body mass index is 39.02 kg/m.  Advanced Directives 01/02/2018 07/15/2015  Does Patient Have a Medical Advance Directive? Yes Yes  Type of Estate agent of [place];Living will Living will;Healthcare Power of Attorney  Copy of Healthcare Power of Attorney in Chart? Yes No - copy requested    Tobacco Social History   Tobacco Use  Smoking Status Never Smoker  Smokeless Tobacco Never Used     Counseling given: No   Clinical Intake:  Pre-visit preparation completed: Yes  Pain : No/denies pain Pain Score: 0-No pain     Nutritional Status: BMI > 30  Obese Nutritional Risks: None Diabetes: No  How often do you need to have someone help you when you read instructions, pamphlets, or other written materials from your doctor or pharmacy?: 5 - Always What is the last grade level you completed in school?: 4th grade  Interpreter Needed?: No  Comments: pt lives with spouse Information entered by :: LPinson, LPN  Past Medical History:  Diagnosis Date  . Chronic lower back pain    [place]  . DDD (degenerative disc disease), lumbar   . Dysmetabolic syndrome X   . Gout, unspecified   . History of DVT (deep vein thrombosis)   . History of pulmonary embolism   . History of stroke 2009   [place]  . History of thrombophlebitis   . HTN (hypertension)   . Memory loss   . Mixed hyperlipidemia   . Obesity   . PFO (patent foramen ovale)   . Primary  hypercoagulable state (HCC) 05/14/2008   Personal h/o DVT, PE, PFO, and lupus AC +   . Stroke (HCC)   . Type II or unspecified type diabetes mellitus without mention of complication, uncontrolled   . Unspecified sleep apnea    Past Surgical History:  Procedure Laterality Date  . COLONOSCOPY  2000   [facility]  . WRIST SURGERY     Right wrist repair post trauma   Family History  Problem Relation Age of Onset  . Emphysema Father   . Diabetes Brother   . Hypertension Brother   . Heart attack Brother 43  . Skin cancer Brother   . Lung cancer Brother   . Cancer Maternal Aunt        ? Type   . Stroke Neg Hx    Social History   Socioeconomic History  . Marital status: Married    Spouse name: Not on file  . Number of children: 4  . Years of education: 8 th  . Highest education level: Not on file  Occupational History  . Not on file  Social Needs  . Financial resource strain: Not on file  . Food insecurity:    Worry: Not on file    Inability: Not on file  . Transportation needs:    Medical: Not on file    Non-medical: Not on file  Tobacco Use  . Smoking status: Never Smoker  .  Smokeless tobacco: Never Used  Substance and Sexual Activity  . Alcohol use: No    Alcohol/week: 0.0 standard drinks  . Drug use: No  . Sexual activity: Not Currently  Lifestyle  . Physical activity:    Days per week: Not on file    Minutes per session: Not on file  . Stress: Not on file  Relationships  . Social connections:    Talks on phone: Not on file    Gets together: Not on file    Attends religious service: Not on file    Active member of club or organization: Not on file    Attends meetings of clubs or organizations: Not on file    Relationship status: Not on file  Other Topics Concern  . Not on file  Social History Narrative   Patient is married with 4 children.   Patient is right handed.   Patient has 8 th grade education.   Patient drinks 4-5 cups daily.    Outpatient  Encounter Medications as of 01/14/2019  Medication Sig  . acetaminophen (TYLENOL) 500 MG tablet Take 500 mg by mouth 3 (three) times daily.  Marland Kitchen. amLODipine (NORVASC) 2.5 MG tablet TAKE 1 TABLET BY MOUTH DAILY  . atorvastatin (LIPITOR) 40 MG tablet Take 1 tablet by mouth once daily  . clotrimazole (LOTRIMIN) 1 % cream Apply 1 application topically 2 (two) times daily.  . cyanocobalamin 500 MCG tablet Take 500 mcg by mouth daily. Vitamin B12  . dextromethorphan (DELSYM) 30 MG/5ML liquid Take 15-60 mg by mouth 2 (two) times daily as needed for cough.  . divalproex (DEPAKOTE SPRINKLE) 125 MG capsule Take 1 capsule (125 mg total) by mouth 2 (two) times daily.  Marland Kitchen. docusate sodium (COLACE) 100 MG capsule Take 100 mg by mouth daily as needed for mild constipation.  . finasteride (PROSCAR) 5 MG tablet Take 1 tablet by mouth once daily  . FOLIC ACID PO Take 3 tablets by mouth 2 (two) times daily.  Marland Kitchen. gabapentin (NEURONTIN) 300 MG capsule TAKE 1 CAPSULE BY MOUTH THREE TIMES DAILY  . guaiFENesin (MUCINEX) 600 MG 12 hr tablet Take 600-1,200 mg by mouth 2 (two) times daily as needed for cough or to loosen phlegm.  Marland Kitchen. ipratropium-albuterol (DUONEB) 0.5-2.5 (3) MG/3ML SOLN Take 3 mLs by nebulization every 2 (two) hours as needed.  Marland Kitchen. LORazepam (ATIVAN) 0.5 MG tablet Take 0.5-1 tablets (0.25-0.5 mg total) by mouth daily as needed for anxiety.  . memantine (NAMENDA XR) 28 MG CP24 24 hr capsule Take 1 capsule (28 mg total) by mouth daily.  . metoprolol tartrate (LOPRESSOR) 25 MG tablet Take 1/2 (one-half) tablet by mouth twice daily  . polyethylene glycol powder (GLYCOLAX/MIRALAX) powder DISSOLVE 1 CAPFUL (17GM) INTO 4 TO 8 OUNCES OF FLUID AND TAKE BY MOUTH ONCE DAILY AS DIRECTED  . Pyridoxine HCl (VITAMIN B-6 PO) Take 1 tablet by mouth 2 (two) times daily.  Marland Kitchen. triamcinolone cream (KENALOG) 0.1 % Apply 1 application topically 2 (two) times daily. Apply to AA. (Patient taking differently: Apply 1 application topically 2  (two) times daily as needed (rash). Apply to AA.)  . warfarin (COUMADIN) 5 MG tablet TAKE AS DIRECTED BY COUMADIN CLINIC  . HYDROcodone-acetaminophen (NORCO) 10-325 MG per tablet Take 1 tablet by mouth every 6 (six) hours as needed for moderate pain.   . [DISCONTINUED] metoprolol succinate (TOPROL-XL) 100 MG 24 hr tablet 1/2 by mouth daily   No facility-administered encounter medications on file as of 01/14/2019.  Activities of Daily Living In your present state of health, do you have any difficulty performing the following activities: 01/14/2019  Hearing? Y  Vision? Y  Difficulty concentrating or making decisions? Y  Walking or climbing stairs? Y  Dressing or bathing? Y  Doing errands, shopping? Y  Preparing Food and eating ? Y  Using the Toilet? Y  In the past six months, have you accidently leaked urine? Y  Do you have problems with loss of bowel control? Y  Managing your Medications? Y  Managing your Finances? Y  Housekeeping or managing your Housekeeping? Y  Some recent data might be hidden    Patient Care Team: Eustaquio Boyden, MD as PCP - General (Family Medicine)   Assessment:   This is a routine wellness examination for [place].  Hearing Screening Comments: Hearing screen was not completed Vision Screening Comments: Vision exam in 2019 with Dr. Lawerance Bach  Exercise Activities and Dietary recommendations Current Exercise Habits: The patient does not participate in regular exercise at present, Exercise limited by: neurologic condition(s);orthopedic condition(s)  Goals    . Follow up with Primary Care Provider     Starting 01/14/2019, I will continue to take medications as prescribed and to keep appointments with PCP as scheduled.        Fall Risk Fall Risk  01/14/2019 01/02/2018 05/10/2017 06/08/2016 12/22/2014  Falls in the past year? 0 No No No No  Risk for fall due to : - - - - -  Risk for fall due to: Comment - - - - -   Depression Screen PHQ 2/9 Scores 01/14/2019  05/31/2018 01/02/2018 06/08/2016  PHQ - 2 Score 0 6 0 0  PHQ- 9 Score 0 16 0 -    Cognitive Function MMSE - Mini Mental State Exam 01/15/2019 10/22/2018 02/20/2018 01/02/2018 08/22/2017  Not completed: (No Data) (No Data) - (No Data) -  Orientation to time - 3 1 - 4  Orientation to Place - 1 2 - 2  Registration - 3 3 - 3  Attention/ Calculation - 0 0 - 0  Recall - 0 0 - 0  Language- name 2 objects - 2 2 - 1  Language- repeat - 1 1 - 1  Language- follow 3 step command - 1 2 - 2  Language- read & follow direction - 1 0 - 0  Write a sentence - 0 0 - 0  Copy design - 0 0 - 0  Total score - 12 11 - 13        Immunization History  Administered Date(s) Administered  . Influenza Split 09/18/2012  . Influenza Whole 09/03/2008  . Influenza, High Dose Seasonal PF 08/21/2013  . Influenza,inj,Quad PF,6+ Mos 09/07/2014, 09/07/2014, 09/07/2015, 12/25/2017, 08/20/2018  . Pneumococcal Conjugate-13 12/22/2014  . Pneumococcal Polysaccharide-23 06/08/2016    Screening Tests Health Maintenance  Topic Date Due  . OPHTHALMOLOGY EXAM  09[birth year]01/1955  . DTaP/Tdap/Td (1 - Tdap) 08/31/1956  . TETANUS/TDAP  08/31/1964  . FOOT EXAM  06/21/2016  . URINE MICROALBUMIN  01/02/2019  . COLONOSCOPY  01/02/2049 (Originally 11/08/2008)  . HEMOGLOBIN A1C  07/17/2019  . INFLUENZA VACCINE  Completed  . Hepatitis C Screening  Completed  . PNA vac Low Risk Adult  Completed     Plan:     I have personally reviewed, addressed, and noted the following in the patient's chart:  A. Medical and social history B. Use of alcohol, tobacco or illicit drugs  C. Current medications and supplements D. Functional ability and  status E.  Nutritional status F.  Physical activity G. Advance directives H. List of other physicians I.  Hospitalizations, surgeries, and ER visits in previous 12 months J.  Vitals K. Screenings to include hearing, vision, cognitive, depression L. Referrals and appointments - none  In addition, I have  reviewed and discussed with patient certain preventive protocols, quality metrics, and best practice recommendations. A written personalized care plan for preventive services as well as general preventive health recommendations were provided to patient.  See attached scanned questionnaire for additional information.   Signed,   Randa Evens, MHA, BS, LPN Health Coach

## 2019-01-22 ENCOUNTER — Encounter: Payer: PPO | Admitting: Family Medicine

## 2019-01-28 NOTE — Progress Notes (Signed)
Reviewed

## 2019-02-02 NOTE — Progress Notes (Signed)
I reviewed health advisor's note, was available for consultation, and agree with documentation and plan.  

## 2019-02-13 ENCOUNTER — Telehealth: Payer: Self-pay | Admitting: Radiology

## 2019-02-13 NOTE — Telephone Encounter (Signed)
Wife doesn't want a doxy of phone visit, she said, all is fine and she will call with any concerns

## 2019-02-13 NOTE — Telephone Encounter (Signed)
Noted. Thanks.

## 2019-02-25 ENCOUNTER — Ambulatory Visit: Payer: PPO

## 2019-02-27 ENCOUNTER — Other Ambulatory Visit: Payer: Self-pay

## 2019-02-27 ENCOUNTER — Ambulatory Visit (INDEPENDENT_AMBULATORY_CARE_PROVIDER_SITE_OTHER): Payer: PPO | Admitting: General Practice

## 2019-02-27 ENCOUNTER — Other Ambulatory Visit (INDEPENDENT_AMBULATORY_CARE_PROVIDER_SITE_OTHER): Payer: PPO

## 2019-02-27 DIAGNOSIS — Z7901 Long term (current) use of anticoagulants: Secondary | ICD-10-CM | POA: Diagnosis not present

## 2019-02-27 DIAGNOSIS — Z7902 Long term (current) use of antithrombotics/antiplatelets: Secondary | ICD-10-CM

## 2019-02-27 DIAGNOSIS — Z5181 Encounter for therapeutic drug level monitoring: Secondary | ICD-10-CM | POA: Diagnosis not present

## 2019-02-27 DIAGNOSIS — Z86718 Personal history of other venous thrombosis and embolism: Secondary | ICD-10-CM | POA: Diagnosis not present

## 2019-02-27 LAB — POCT INR: INR: 2.2 (ref 2.0–3.0)

## 2019-02-27 NOTE — Patient Instructions (Signed)
Pre visit review using our clinic review tool, if applicable. No additional management support is needed unless otherwise documented below in the visit note.  Continue to take 1 tablet daily except 1/2 tablet on Monday and Fridays.  Re-check in 5-6 weeks.  Patient is doing well without any changes to diet, health or medications.  Wife present and verbalizes understanding of all instructions today.

## 2019-03-07 ENCOUNTER — Other Ambulatory Visit: Payer: Self-pay | Admitting: Family Medicine

## 2019-03-26 ENCOUNTER — Other Ambulatory Visit: Payer: Self-pay | Admitting: Family Medicine

## 2019-04-10 ENCOUNTER — Ambulatory Visit (INDEPENDENT_AMBULATORY_CARE_PROVIDER_SITE_OTHER): Payer: PPO

## 2019-04-10 ENCOUNTER — Other Ambulatory Visit (INDEPENDENT_AMBULATORY_CARE_PROVIDER_SITE_OTHER): Payer: PPO

## 2019-04-10 DIAGNOSIS — Z86718 Personal history of other venous thrombosis and embolism: Secondary | ICD-10-CM

## 2019-04-10 DIAGNOSIS — Z7901 Long term (current) use of anticoagulants: Secondary | ICD-10-CM

## 2019-04-10 DIAGNOSIS — Z7902 Long term (current) use of antithrombotics/antiplatelets: Secondary | ICD-10-CM

## 2019-04-10 DIAGNOSIS — Z5181 Encounter for therapeutic drug level monitoring: Secondary | ICD-10-CM

## 2019-04-10 LAB — POCT INR: INR: 1.2 — AB (ref 2.0–3.0)

## 2019-04-10 NOTE — Patient Instructions (Addendum)
Pre visit review using our clinic review tool, if applicable. No additional management support is needed unless otherwise documented below in the visit note.  Please take 1 1/2 tablets today (6/4), take 1 tablet tomorrow (6/5) and 1 1/2 tablets on Saturday, 6/6.  On Sunday continue to take 1 tablet daily except 1/2 tablet on Monday and Fridays.  Re-check in 1 week.  Dosing instructions given to patient' wife and she verbalized understanding.

## 2019-04-11 IMAGING — CT CT HEAD CODE STROKE
4 series · 25 of 47 positions shown, 29 images · non-contrast
Comparison: Prior CT from 08/31/2015.

CLINICAL DATA: Code stroke. Initial evaluation for acute left arm
weakness.

EXAM:
CT HEAD WITHOUT CONTRAST
TECHNIQUE: Contiguous axial images were obtained from the base of the skull
through the vertex without intravenous contrast.

[Series 205: head w/o, idose (1) · axial · non-contrast · 0.49mm/px · z∈[+121,+241]mm · 13 of 30 slices shown, 17 images]
[im 3/30  brain]
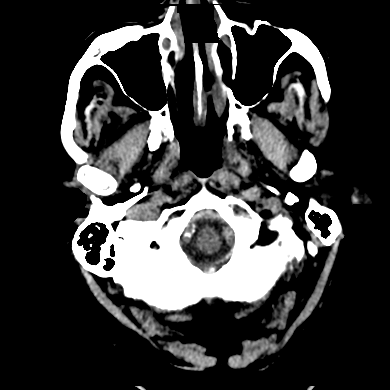
[im 3/30  bone]
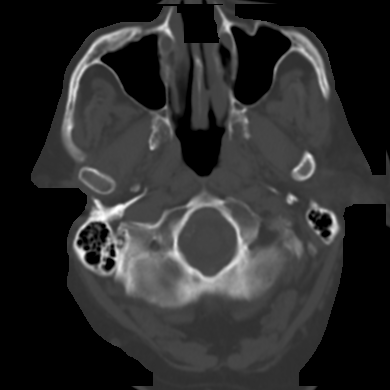
[im 5/30  brain]
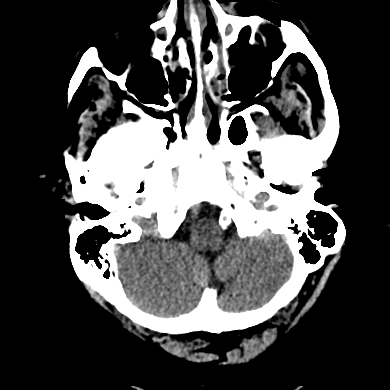
[im 7/30  brain]
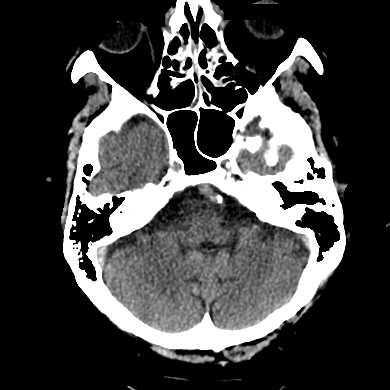
[im 9/30  brain]
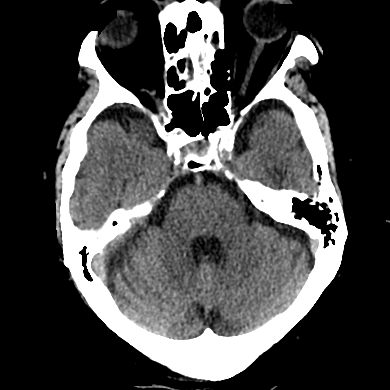
[im 11/30  brain]
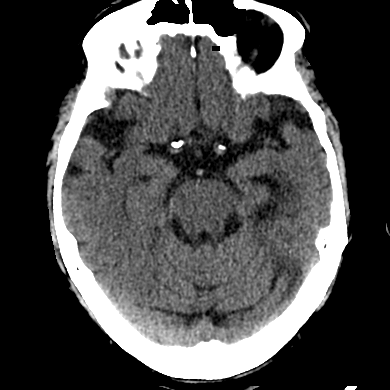
[im 11/30  bone]
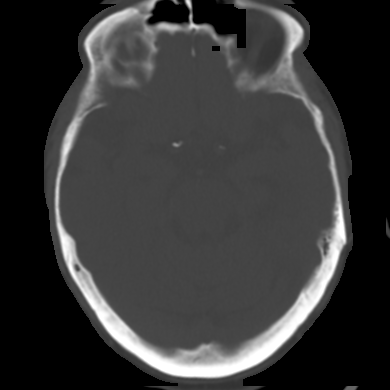
[im 13/30  brain]
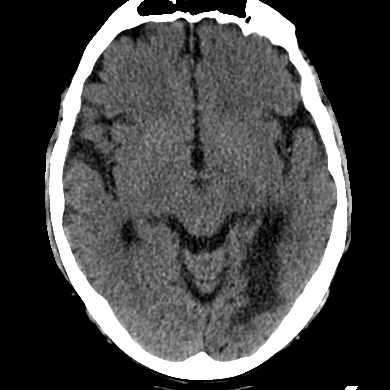
[im 15/30  brain]
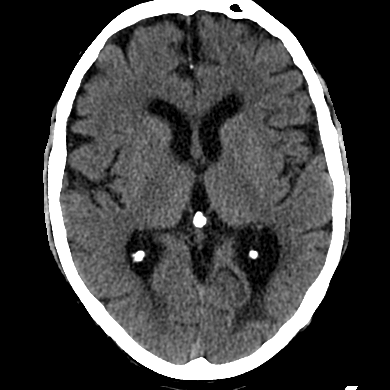
[im 17/30  brain]
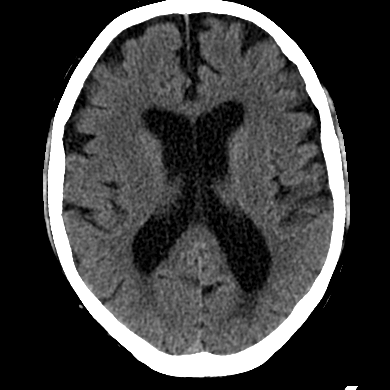
[im 19/30  brain]
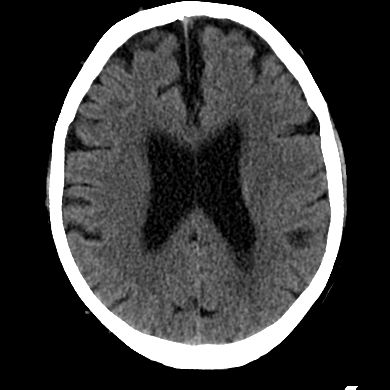
[im 19/30  bone]
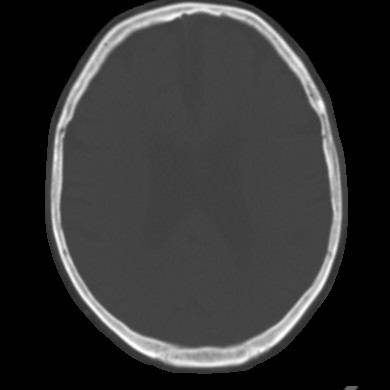
[im 21/30  brain]
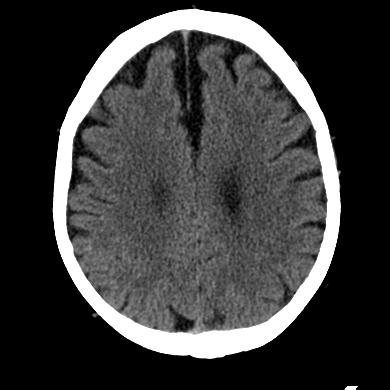
[im 23/30  brain]
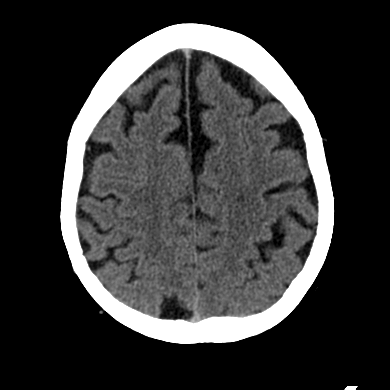
[im 25/30  brain]
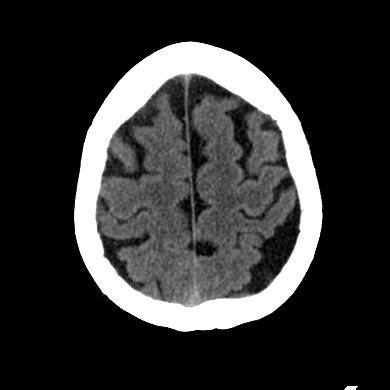
[im 27/30  brain]
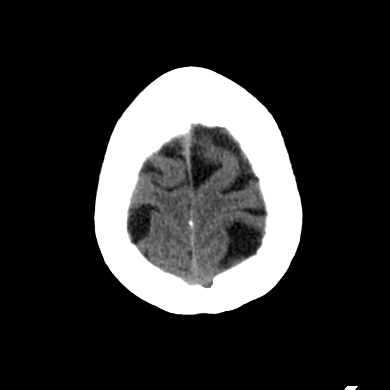
[im 27/30  bone]
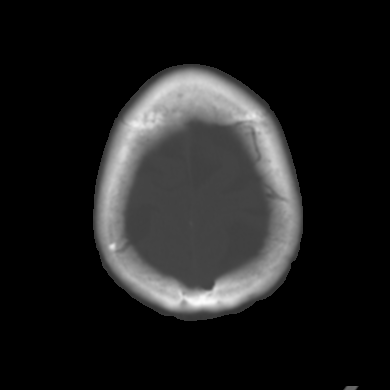

[Series 206: head w/o bone, idose (1) · axial · non-contrast · 0.60mm/px · z∈[+121,+201]mm · 6 of 30 slices shown]
[im 3/30  bone]
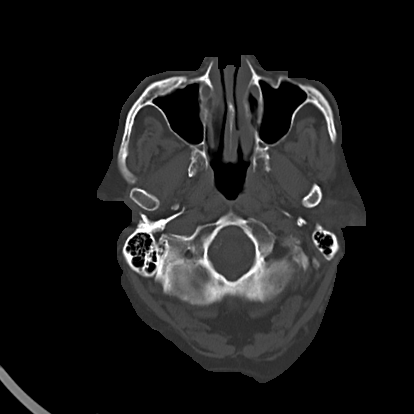
[im 7/30  bone]
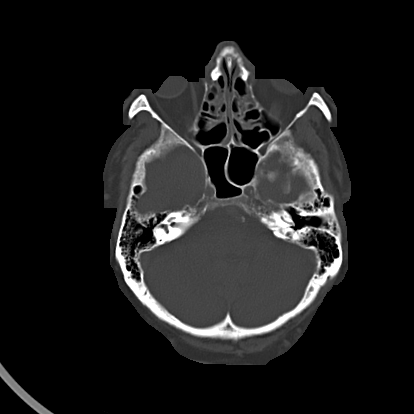
[im 11/30  bone]
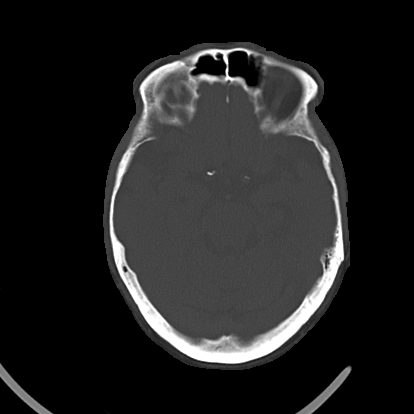
[im 13/30  bone]
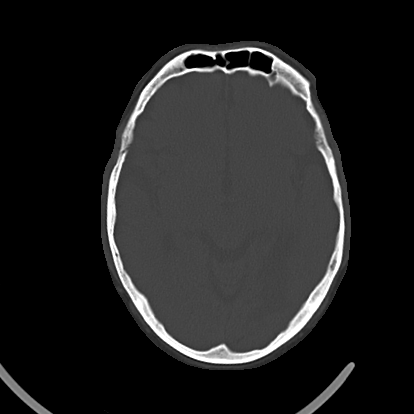
[im 17/30  bone]
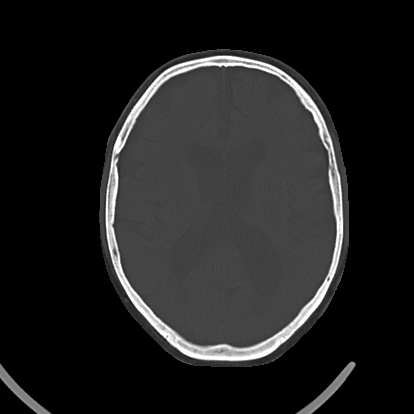
[im 19/30  bone]
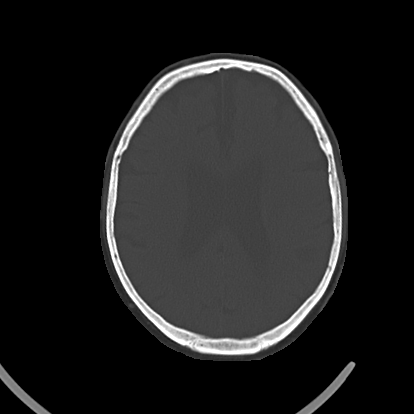

[Series 207: coronal st, idose (1) · coronal · 0.40mm/px · 3 of 71 slices shown]
[im 24/71  brain]
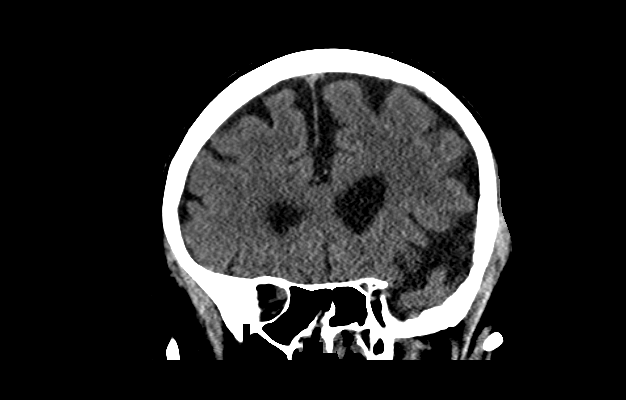
[im 32/71  brain]
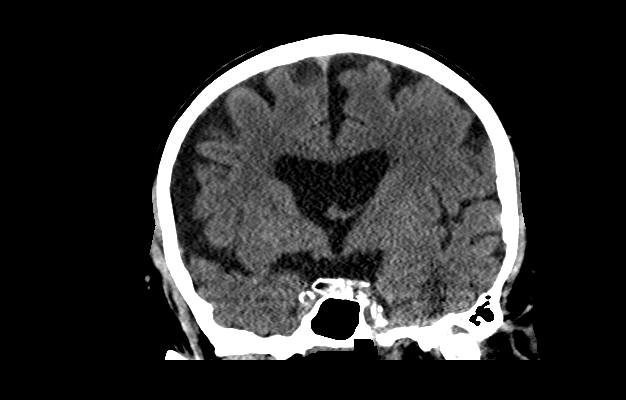
[im 39/71  brain]
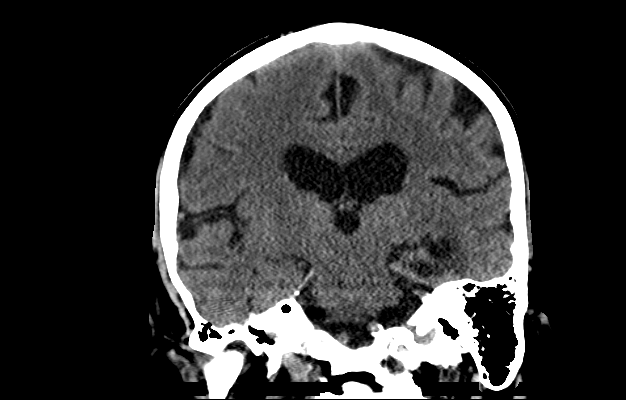

[Series 208: sagittal st, idose (1) · sagittal · 0.40mm/px · 3 of 66 slices shown]
[im 22/66  brain]
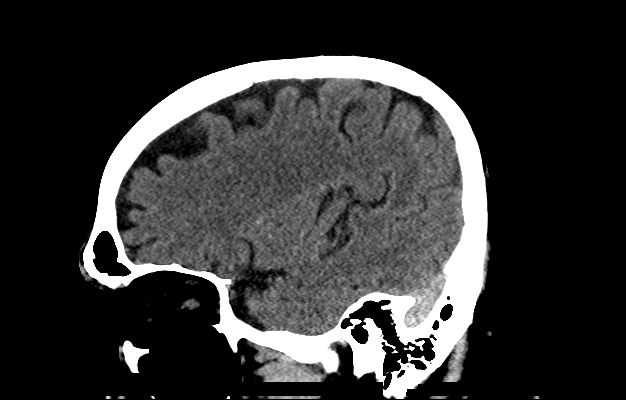
[im 33/66  brain]
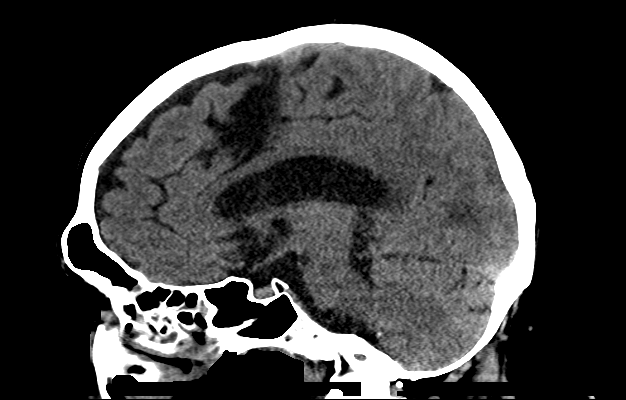
[im 44/66  brain]
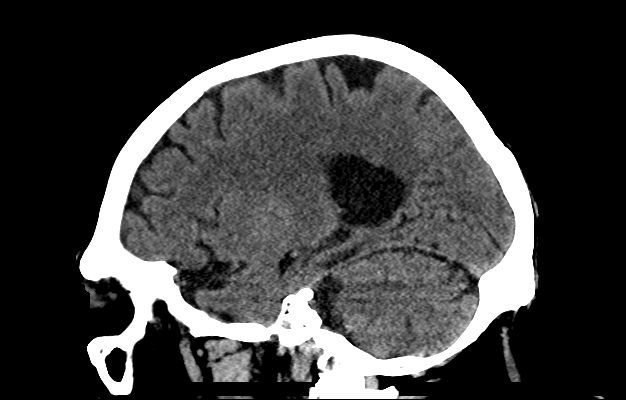

[25 of 47 positions shown; findings below may reference images not displayed]

FINDINGS: Brain: Generalized cerebral atrophy with mild chronic small vessel
ischemic disease. Encephalomalacia within the left occipital region
compatible with remote left PCA territory infarct.

No acute intracranial hemorrhage. No evidence for acute large vessel
territory infarct. No mass lesion, midline shift or mass effect. No
hydrocephalus. No extra-axial fluid collection.

Vascular: No hyperdense vessel. Scattered vascular calcifications
noted within the carotid siphons.

Skull: Scalp soft tissues within normal limits.  Calvarium intact.

Sinuses/Orbits: Visualized globes and orbital soft tissues within
normal limits. Scattered mucosal thickening within the ethmoidal air
cells. Paranasal sinuses otherwise clear. No mastoid effusion.

ASPECTS (Alberta Stroke Program Early CT Score)

- Ganglionic level infarction (caudate, lentiform nuclei, internal
capsule, insula, M1-M3 cortex): 7

- Supraganglionic infarction (M4-M6 cortex): 3

Total score (0-10 with 10 being normal): 10
IMPRESSION: 1. No acute intracranial infarct or other process identified.
2. ASPECTS is 10
3. Stable atrophy with chronic microvascular ischemic disease and
remote left PCA territory infarct.
Critical Value/emergent results were called by telephone at the time
of interpretation on 02/15/2017 at [DATE] to Dr. Saurav, who
verbally acknowledged these results.

## 2019-04-17 ENCOUNTER — Ambulatory Visit (INDEPENDENT_AMBULATORY_CARE_PROVIDER_SITE_OTHER): Payer: PPO

## 2019-04-17 ENCOUNTER — Other Ambulatory Visit (INDEPENDENT_AMBULATORY_CARE_PROVIDER_SITE_OTHER): Payer: PPO

## 2019-04-17 ENCOUNTER — Other Ambulatory Visit: Payer: Self-pay

## 2019-04-17 DIAGNOSIS — Z5181 Encounter for therapeutic drug level monitoring: Secondary | ICD-10-CM

## 2019-04-17 DIAGNOSIS — Z86718 Personal history of other venous thrombosis and embolism: Secondary | ICD-10-CM

## 2019-04-17 DIAGNOSIS — Z7902 Long term (current) use of antithrombotics/antiplatelets: Secondary | ICD-10-CM

## 2019-04-17 DIAGNOSIS — Z7901 Long term (current) use of anticoagulants: Secondary | ICD-10-CM | POA: Diagnosis not present

## 2019-04-17 LAB — POCT INR: INR: 2.6 (ref 2.0–3.0)

## 2019-04-17 NOTE — Patient Instructions (Signed)
Pre visit review using our clinic review tool, if applicable. No additional management support is needed unless otherwise documented below in the visit note.  Please continue to take 1 tablet daily except 1/2 tablet on Monday and Fridays.  Re-check in 4 weeks.  Dosing instructions given to patient' wife and she verbalized understanding.  

## 2019-04-21 ENCOUNTER — Telehealth: Payer: Self-pay

## 2019-04-21 NOTE — Telephone Encounter (Signed)
LEft vm for pts wife that appt on Wednesday will be change to mychart video visit due to COVID 19. I stated to call back for further instructions. Pt will not a mychart account to do video visit. We can text them the link or email. Pt will need a computer or phone with a camera on it.

## 2019-04-22 NOTE — Telephone Encounter (Signed)
I called pt wife [name]  that per Janett Billow NP she recommends pt being r/s out to due being high risk coming in office. I r/s pt in August for in office visit.

## 2019-04-22 NOTE — Telephone Encounter (Signed)
Pt returned call and stated they are unable to do a video visit, and they have no help due to daughter working in a doctors office and cannot take time off to help them

## 2019-04-22 NOTE — Telephone Encounter (Signed)
I spoke with Janett Billow NP and she recommend pt be r/s out till July or August due to COVID 19 due to him being high risk.

## 2019-04-23 ENCOUNTER — Ambulatory Visit: Payer: Self-pay | Admitting: Adult Health

## 2019-04-24 ENCOUNTER — Telehealth: Payer: Self-pay

## 2019-04-24 NOTE — Telephone Encounter (Signed)
Noted  

## 2019-04-24 NOTE — Telephone Encounter (Signed)
Mrs Logan Valdez signed) for 1 wk pt has broken area size of 50 cent piece on buttock; pts wife using desitin,neosporin and bandaid; wife said looks better but pt said still hurts. Per neurology pt high risk and virtual appt scheduled 04/25/19 at 11:20 with Dr Diona Browner. No covid symptoms (cough for long time), no travel andno known exposure to covid.

## 2019-04-25 ENCOUNTER — Ambulatory Visit (INDEPENDENT_AMBULATORY_CARE_PROVIDER_SITE_OTHER): Payer: PPO | Admitting: Family Medicine

## 2019-04-25 ENCOUNTER — Encounter: Payer: Self-pay | Admitting: Family Medicine

## 2019-04-25 DIAGNOSIS — L89311 Pressure ulcer of right buttock, stage 1: Secondary | ICD-10-CM

## 2019-04-25 DIAGNOSIS — L89321 Pressure ulcer of left buttock, stage 1: Secondary | ICD-10-CM

## 2019-04-25 HISTORY — DX: Pressure ulcer of left buttock, stage 1: L89.321

## 2019-04-25 MED ORDER — TEGADERM HYDROCOLLOID EX MISC: 1.0000 | CUTANEOUS | 1 refills | Status: DC

## 2019-04-25 NOTE — Assessment & Plan Note (Signed)
No clear sign of infection. No exudate  Apply tegaderm hydrocolloid dressing weekly to assist healing.  Follow up in office after 2 weeks if not healing.  Call sooner if redness spreading, odor or copious discharge

## 2019-04-25 NOTE — Patient Instructions (Addendum)
No clear sign of infection. No exudate  Apply tegaderm hydrocolloid dressing weekly to assist healing.  Follow up in office after 2 weeks if not healing.  Call sooner if redness spreading, odor or copious discharge  How to apply a hydrocolloid dressing 1 Wash your hands and put on gloves. 2 Remove the soiled dressing (noting the date it was applied) and place it in a trash bag. 3 Remove your gloves, wash your hands, and put on new gloves. 4 Clean the wound with normal saline solution or prescribed cleanser. 5 Use clean gauze to pat dry the tissue surrounding the wound. 6 Remove your gloves, wash your hands, and put on new gloves. 7 Apply liquid barrier film or moisture barrier to the periwound area. 8 For deep wounds, apply wound filler or packing materials as indicated. 9 Before applying the hydrocolloid dressing, warm it by holding it between your hands to increase adhesive ability. 10 Remove the paper backing from the dressing. 11 Gently fold the dressing in half lengthwise and apply it from the center of the wound outward. 12 Smooth the dressing in place from the center outward. Hold the dressing in place for a few seconds to improve adhesion. 13 The dressing should be at least 1 inch larger than the wound. (Some manufacturers may require a 2-inch border.) 14 You may apply tape around the edges to secure the dressing. 15 Dispose of the waste. 16 Remove your gloves and discard.   Preventing Pressure Injuries What is a pressure injury?  A pressure injury, previously called a bedsore or a pressure ulcer, is an injury to the skin and underlying tissue caused by pressure. A pressure injury can happen when your skin presses against a surface, such as a mattress or wheelchair seat, for too long. The pressure on the blood vessels causes reduced blood flow to your skin. This can eventually cause the skin tissue to die and break down into a wound. Pressure injuries usually develop:  Over  bony parts of the body, such as the tailbone, shoulders, elbows, hips, and heels.  Under medical devices, such as respiratory equipment, stockings, tubes, and splints. They can cause pain, muscle damage, and infection. How do pressure injuries happen? Pressure injuries are caused by a lack of blood supply to an area of skin. These injuries begin as a reddened area on the skin and can become an open sore. They can result from intense pressure over a short period of time or from less pressure over a long period of time. Pressure injuries can vary in severity. This condition is more likely to develop in people who:  Are in the hospital or an extended care facility.  Are bedridden or in a wheelchair.  Have an injury or disease that keeps them from: ? Moving normally. ? Feeling pain or pressure. ? Communicating if they feel pain or pressure.  Have a condition that: ? Makes them sleepy or less alert. ? Causes poor blood flow.  Need to wear a medical device.  Have poor control of their bladder or bowel functions (incontinence).  Have poor nutrition (malnutrition).  Have had this condition before.  Are of certain ethnicities. People of African American and Latino or Hispanic descent are at higher risk compared to other ethnic groups. What actions can I take to prevent pressure injuries? Skin Care  Keep your skin clean and dry. Gently pat your skin dry.  Do not rub or massage boney areas of your skin.  Moisturize dry skin.  Use gentle cleansers and skin protectants routinely if you are incontinent.  Check your skin every day for any changes in color and for any new blisters or sores. Make sure to check under and around any medical devices and between skin folds. Have a caregiver do this for you if you are not able. Reducing and Redistributing Pressure  Do not lie or sit in one position for a long time. Move or change position every two hours, or as told by your health care  provider.  Use pillows or cushions to redistribute pressure. Ask your health care provider to recommend cushions or pads for you.  Use medical devices that to not rub your skin. Tell your health care provider if one of your medical devices is causing pain or irritation. Medicines  Take over-the-counter and prescription medicines only as told by your health care provider.  If you were prescribed an antibiotic medicine, take it or apply it as told by your health care provider. Do not stop taking or using the antibiotic even if your condition improves. General Instructions   Be as active as you can every day. Ask your health care provider to suggest safe exercises or activities.  Work with your health care provider to manage any chronic health conditions.  Eat a healthy diet that includes lots of protein. Ask your health care provider for diet advice.  Drink enough fluid to keep your urine clear or pale yellow.  Do not abuse drugs or alcohol.  Do not smoke.  Keep all follow-up visits as told by your health care provider. This is important. What steps will be taken to prevent pressure injuries if I am in the hospital? Your health care providers:  Will inspect your skin at least daily. Skin under or around medical devices should be checked at least twice a day while you are in the hospital.  May recommend that you use certain types of bedding to help prevent them. These may include a pad, mattress, or chair cushion that is filled with gel, air, water, or foam.  Will evaluate your nutrition and consult a diet specialist (dietician), if needed.  Will inspect and change any wound dressings regularly.  May help you move into different positions every few hours.  Will adjust any medical devices and braces as needed to limit pressure on your skin.  Will keep your skin clean and dry.  May use gentle cleansers and skin protectants, if you are incontinent.  Will moisturize any dry  skin. Make sure that you let your health care provider know if you feel or see any changes in your skin. This information is not intended to replace advice given to you by your health care provider. Make sure you discuss any questions you have with your health care provider. Document Released: 11/30/2004 Document Revised: 05/10/2017 Document Reviewed: 07/29/2015 Elsevier Interactive Patient Education  2019 Elsevier Inc.   Pressure Injury  A pressure injury is damage to the skin and underlying tissue that results from pressure being applied to an area of the body. It often affects people who must spend a long time in a bed or chair because of a medical condition. Pressure injuries usually occur:  Over bony parts of the body, such as the tailbone, shoulders, elbows, hips, heels, spine, ankles, and back of the head.  Under medical devices that make contact with the body, such as respiratory equipment, stockings, tubes, and splints. Pressure injuries start as reddened areas on the skin and can lead to  pain and an open wound. What are the causes? This condition is caused by frequent or constant pressure to an area of the body. Decreased blood flow to the skin can eventually cause the skin tissue to die and break down, causing a wound. What increases the risk? You are more likely to develop this condition if you:  Are in the hospital or an extended care facility.  Are bedridden or in a wheelchair.  Have an injury or disease that keeps you from: ? Moving normally. ? Feeling pain or pressure.  Have a condition that: ? Makes you sleepy or less alert. ? Causes poor blood flow.  Need to wear a medical device.  Have poor control of your bladder or bowel functions (incontinence).  Have poor nutrition (malnutrition). If you are at risk for pressure injuries, your health care provider may recommend certain types of mattresses, mattress covers, pillows, cushions, or boots to help prevent them.  These may include products filled with air, foam, gel, or sand. What are the signs or symptoms? Symptoms of this condition depend on the severity of the injury. Symptoms may include:  Red or dark areas of the skin.  Pain, warmth, or a change of skin texture.  Blisters.  An open wound. How is this diagnosed? This condition is diagnosed with a medical history and physical exam. You may also have tests, such as:  Blood tests.  Imaging tests.  Blood flow tests. Your pressure injury will be staged based on its severity. Staging is based on:  The depth of the tissue injury, including whether there is exposure of muscle, bone, or tendon.  The cause of the pressure injury. How is this treated? This condition may be treated by:  Relieving or redistributing pressure on your skin. This includes: ? Frequently changing your position. ? Avoiding positions that caused the wound or that can make the wound worse. ? Using specific bed mattresses, chair cushions, or protective boots. ? Moving medical devices from an area of pressure, or placing padding between the skin and the device. ? Using foams, creams, or powders to prevent rubbing (friction) on the skin.  Keeping your skin clean and dry. This may include using a skin cleanser or skin barrier as told by your health care provider.  Cleaning your injury and removing any dead tissue from the wound (debridement).  Placing a bandage (dressing) over your injury.  Using medicines for pain or to prevent or treat infection. Surgery may be needed if other treatments are not working or if your injury is very deep. Follow these instructions at home: Wound care  Follow instructions from your health care provider about how to take care of your wound. Make sure you: ? Wash your hands with soap and water before and after you change your bandage (dressing). If soap and water are not available, use hand sanitizer. ? Change your dressing as told by  your health care provider.  Check your wound every day for signs of infection. Have a caregiver do this for you if you are not able. Check for: ? Redness, swelling, or increased pain. ? More fluid or blood. ? Warmth. ? Pus or a bad smell. Skin care  Keep your skin clean and dry. Gently pat your skin dry.  Do not rub or massage your skin.  You or a caregiver should check your skin every day for any changes in color or any new blisters or sores (ulcers). Medicines  Take over-the-counter and prescription medicines only as  told by your health care provider.  If you were prescribed an antibiotic medicine, take or apply it as told by your health care provider. Do not stop using the antibiotic even if your condition improves. Reducing and redistributing pressure  Do not lie or sit in one position for a long time. Move or change position every 1-2 hours, or as told by your health care provider.  Use pillows or cushions to reduce pressure. Ask your health care provider to recommend cushions or pads for you. General instructions   Eat a healthy diet that includes lots of protein.  Drink enough fluid to keep your urine pale yellow.  Be as active as you can every day. Ask your health care provider to suggest safe exercises or activities.  Do not abuse drugs or alcohol.  Do not use any products that contain nicotine or tobacco, such as cigarettes, e-cigarettes, and chewing tobacco. If you need help quitting, ask your health care provider.  Keep all follow-up visits as told by your health care provider. This is important. Contact a health care provider if:  You have: ? A fever or chills. ? Pain that is not helped by medicine. ? Any changes in skin color. ? New blisters or sores. ? Pus or a bad smell coming from your wound. ? Redness, swelling, or pain around your wound. ? More fluid or blood coming from your wound.  Your wound does not improve after 1-2 weeks of treatment.  Summary  A pressure injury is damage to the skin and underlying tissue that results from pressure being applied to an area of the body.  Do not lie or sit in one position for a long time. Your health care provider may advise you to move or change position every 1-2 hours.  Follow instructions from your health care provider about how to take care of your wound.  Keep all follow-up visits as told by your health care provider. This is important. This information is not intended to replace advice given to you by your health care provider. Make sure you discuss any questions you have with your health care provider. Document Released: 10/23/2005 Document Revised: 05/22/2018 Document Reviewed: 05/22/2018 Elsevier Interactive Patient Education  [employer]2019 Elsevier Inc.

## 2019-04-25 NOTE — Progress Notes (Signed)
VIRTUAL VISIT Due to national recommendations of social distancing due to [place] 19, a virtual visit is felt to be most appropriate for this patient at this time.   I connected with the patient on 04/25/19 at 11:20 AM EDT by virtual telehealth platform and verified that I am speaking with the correct person using two identifiers.   I discussed the limitations, risks, security and privacy concerns of performing an evaluation and management service by  virtual telehealth platform and the availability of in person appointments. I also discussed with the patient that there may be a patient responsible charge related to this service. The patient expressed understanding and agreed to proceed.  Patient location: Home Provider Location: [facility] Participants: Chanci Ojala Diona Browner and [name].   Chief Complaint  Patient presents with  . Broken Skin Area on Buttocks    History of Present Illness: 73 year old male pt of Dr. Darnell Level with history of DM, vascular dementia, HTN, COPDand chronic back pain present with new onset lesion on buttocks on central upper gluteal crease.. present in last week. He sits a lot and sleeps. Uses lift chair. Limited due to back pain.  he is not incontinent, does not need depends Wife provides history.   Are is sore and burning. No odor or discharge  minimal surrounding erythema  no fever   She has been  Placing Desitin which helps minimally.  COVID 19 screen No recent travel or known exposure to COVID19 The patient denies respiratory symptoms of COVID 19 at this time.  The importance of social distancing was discussed today.   Review of Systems  Constitutional: Negative for chills and fever.  HENT: Negative for congestion and ear pain.   Eyes: Negative for pain and redness.  Respiratory: Negative for cough and shortness of breath.   Cardiovascular: Negative for chest pain, palpitations and leg swelling.  Gastrointestinal: Negative for abdominal  pain, blood in stool, constipation, diarrhea, nausea and vomiting.  Genitourinary: Negative for dysuria.  Musculoskeletal: Negative for falls and myalgias.  Skin: Negative for rash.  Neurological: Negative for dizziness.  Psychiatric/Behavioral: Negative for depression. The patient is not nervous/anxious.       Past Medical History:  Diagnosis Date  . Chronic lower back pain    [place]  . DDD (degenerative disc disease), lumbar   . Dysmetabolic syndrome X   . Gout, unspecified   . History of DVT (deep vein thrombosis)   . History of pulmonary embolism   . History of stroke 2009   [place]  . History of thrombophlebitis   . HTN (hypertension)   . Memory loss   . Mixed hyperlipidemia   . Obesity   . PFO (patent foramen ovale)   . Primary hypercoagulable state ([place]) 05/14/2008   Personal h/o DVT, PE, PFO, and lupus AC +   . Stroke ([place])   . Type II or unspecified type diabetes mellitus without mention of complication, uncontrolled   . Unspecified sleep apnea     reports that he has never smoked. He has never used smokeless tobacco. He reports that he does not drink alcohol or use drugs.   Current Outpatient Medications:  .  acetaminophen (TYLENOL) 500 MG tablet, Take 500 mg by mouth 3 (three) times daily., Disp: , Rfl:  .  amLODipine (NORVASC) 2.5 MG tablet, Take 1 tablet by mouth once daily, Disp: 90 tablet, Rfl: 0 .  atorvastatin (LIPITOR) 40 MG tablet, Take 1 tablet by mouth once daily, Disp: 90  tablet, Rfl: 0 .  clotrimazole (LOTRIMIN) 1 % cream, Apply 1 application topically 2 (two) times daily., Disp: 30 g, Rfl: 0 .  cyanocobalamin 500 MCG tablet, Take 500 mcg by mouth daily. Vitamin B12, Disp: , Rfl:  .  dextromethorphan (DELSYM) 30 MG/5ML liquid, Take 15-60 mg by mouth 2 (two) times daily as needed for cough., Disp: , Rfl:  .  divalproex (DEPAKOTE SPRINKLE) 125 MG capsule, Take 1 capsule (125 mg total) by mouth 2 (two) times daily., Disp: 60 capsule, Rfl: 4 .  docusate sodium  (COLACE) 100 MG capsule, Take 100 mg by mouth daily as needed for mild constipation., Disp: , Rfl:  .  finasteride (PROSCAR) 5 MG tablet, Take 1 tablet by mouth once daily, Disp: 90 tablet, Rfl: 0 .  FOLIC ACID PO, Take 3 tablets by mouth 2 (two) times daily., Disp: , Rfl:  .  gabapentin (NEURONTIN) 300 MG capsule, TAKE 1 CAPSULE BY MOUTH THREE TIMES DAILY, Disp: 270 capsule, Rfl: 0 .  guaiFENesin (MUCINEX) 600 MG 12 hr tablet, Take 600-1,200 mg by mouth 2 (two) times daily as needed for cough or to loosen phlegm., Disp: , Rfl:  .  HYDROcodone-acetaminophen (NORCO) 10-325 MG per tablet, Take 1 tablet by mouth every 6 (six) hours as needed for moderate pain. , Disp: , Rfl:  .  ipratropium-albuterol (DUONEB) 0.5-2.5 (3) MG/3ML SOLN, Take 3 mLs by nebulization every 2 (two) hours as needed., Disp: 360 mL, Rfl: 1 .  LORazepam (ATIVAN) 0.5 MG tablet, Take 0.5-1 tablets (0.25-0.5 mg total) by mouth daily as needed for anxiety., Disp: 20 tablet, Rfl: 0 .  memantine (NAMENDA XR) 28 MG CP24 24 hr capsule, Take 1 capsule (28 mg total) by mouth daily., Disp: 30 capsule, Rfl: 8 .  metoprolol tartrate (LOPRESSOR) 25 MG tablet, Take 1/2 (one-half) tablet by mouth twice daily, Disp: 90 tablet, Rfl: 0 .  polyethylene glycol powder (GLYCOLAX/MIRALAX) powder, DISSOLVE 1 CAPFUL (17GM) INTO 4 TO 8 OUNCES OF FLUID AND TAKE BY MOUTH ONCE DAILY AS DIRECTED, Disp: 255 g, Rfl: 0 .  Pyridoxine HCl (VITAMIN B-6 PO), Take 1 tablet by mouth 2 (two) times daily., Disp: , Rfl:  .  triamcinolone cream (KENALOG) 0.1 %, Apply 1 application topically 2 (two) times daily. Apply to AA. (Patient taking differently: Apply 1 application topically 2 (two) times daily as needed (rash). Apply to AA.), Disp: 30 g, Rfl: 0 .  warfarin (COUMADIN) 5 MG tablet, TAKE AS DIRECTED BY COUMADIN CLINIC, Disp: 90 tablet, Rfl: 1   Observations/Objective: Height 5\' 5"  (1.651 m).  Physical Exam  Physical Exam Constitutional:      General: The patient is  not in acute distress.ABle to walk Pulmonary:     Effort: Pulmonary effort is normal. No respiratory distress.  Neurological:     Mental Status: The patient is alert and oriented to person, place, and time. Has memory loss Psychiatric:        Mood and Affect: Mood normal.        Behavior: Behavior normal.  Skin: Area on buttock shallow ulcer,  Minimal surrounding erythema.. stage 1 pressure ulcer.  Assessment and Plan   Pressure ulcer of right buttock, stage 1 No clear sign of infection. No exudate  Apply tegaderm hydrocolloid dressing weekly to assist healing.  Follow up in office after 2 weeks if not healing.  Call sooner if redness spreading, odor or copious discharge   I discussed the assessment and treatment plan with the patient. The patient  was provided an opportunity to ask questions and all were answered. The patient agreed with the plan and demonstrated an understanding of the instructions.   The patient was advised to call back or seek an in-person evaluation if the symptoms worsen or if the condition fails to improve as anticipated.     Kerby Nora, MD

## 2019-05-02 ENCOUNTER — Telehealth: Payer: Self-pay

## 2019-05-02 MED ORDER — METOPROLOL TARTRATE 25 MG PO TABS: ORAL_TABLET | ORAL | 0 refills | Status: DC

## 2019-05-02 NOTE — Telephone Encounter (Signed)
Spoke with [facility] asking for alternative option to Tegaderm dressing.  Told pt would need to take rx to a medical supply.  They only have general bandages at [facility].

## 2019-05-02 NOTE — Telephone Encounter (Signed)
Mrs Logan Valdez left v/m that [facility] advised pt that the tegaderm dressing was discontinued. Did [facility] anything to [facility]. pts wife request cb and also wanting refill on metoprolol.[facility] [address].

## 2019-05-02 NOTE — Telephone Encounter (Signed)
Left message on vm per dpr relaying Dr. Synthia Innocent message.  Also, notified pt metoprolol refill was sent to pharmacy.

## 2019-05-02 NOTE — Telephone Encounter (Signed)
Metoprolol refilled again (although we sent 3 month supply last month. We did not receive request from [place]. Is there an alternative option to tegaderm available at [place]? Ok to send that in.

## 2019-05-02 NOTE — Telephone Encounter (Signed)
plz notify wife may buy these at local medical supply store.  What is she using these for?

## 2019-05-15 ENCOUNTER — Ambulatory Visit (INDEPENDENT_AMBULATORY_CARE_PROVIDER_SITE_OTHER): Payer: PPO | Admitting: General Practice

## 2019-05-15 DIAGNOSIS — Z7901 Long term (current) use of anticoagulants: Secondary | ICD-10-CM

## 2019-05-15 DIAGNOSIS — Z7902 Long term (current) use of antithrombotics/antiplatelets: Secondary | ICD-10-CM

## 2019-05-15 LAB — POCT INR: INR: 1.7 — AB (ref 2.0–3.0)

## 2019-05-15 NOTE — Patient Instructions (Addendum)
Pre visit review using our clinic review tool, if applicable. No additional management support is needed unless otherwise documented below in the visit note.  Please take 1 1/2 tablets today and then continue to take 1 tablet daily except 1/2 tablet on Monday and Fridays.  Re-check in 4 weeks.  Dosing instructions given to patient' wife and she verbalized understanding.

## 2019-05-28 ENCOUNTER — Telehealth: Payer: Self-pay | Admitting: Adult Health

## 2019-05-28 MED ORDER — DIVALPROEX SODIUM 125 MG PO CSDR: 125.0000 mg | DELAYED_RELEASE_CAPSULE | Freq: Two times a day (BID) | ORAL | 1 refills | Status: DC

## 2019-05-28 NOTE — Telephone Encounter (Signed)
Order placed for 30 day supply and one refill. Divalproex 125mg  capsule take one po bid.

## 2019-05-28 NOTE — Telephone Encounter (Signed)
Pts wife stopped by office to request refills for divalproex (DEPAKOTE SPRINKLE) 125 MG capsule sent to [facility] to last until upcoming appt

## 2019-06-12 ENCOUNTER — Ambulatory Visit (INDEPENDENT_AMBULATORY_CARE_PROVIDER_SITE_OTHER): Payer: PPO | Admitting: General Practice

## 2019-06-12 DIAGNOSIS — Z7901 Long term (current) use of anticoagulants: Secondary | ICD-10-CM

## 2019-06-12 DIAGNOSIS — Z7902 Long term (current) use of antithrombotics/antiplatelets: Secondary | ICD-10-CM

## 2019-06-12 LAB — POCT INR: INR: 2.5 (ref 2.0–3.0)

## 2019-06-12 NOTE — Patient Instructions (Addendum)
Pre visit review using our clinic review tool, if applicable. No additional management support is needed unless otherwise documented below in the visit note.  Please continue to take 1 tablet daily except 1/2 tablet on Monday and Fridays.  Re-check in 4 weeks.  Dosing instructions given to patient' wife and she verbalized understanding.

## 2019-06-18 ENCOUNTER — Other Ambulatory Visit: Payer: Self-pay | Admitting: *Deleted

## 2019-06-18 NOTE — Telephone Encounter (Signed)
Patient's wife called stating that they requested a refill on his Atorvastatin last month and never got the refill. Patient's wife stated that he has been without his medication now for a week and the pharmacy was suppose to request the refill again. Mrs. Terhune stated that they cancelled his last appointment with Dr. Danise Mina and they need to know when he needs to be seen again? Last office visit 04/25/19 acute Last refill 01/08/19 #90 See allergy/contraindication

## 2019-06-19 MED ORDER — ATORVASTATIN CALCIUM 40 MG PO TABS: 40.0000 mg | ORAL_TABLET | Freq: Every day | ORAL | 1 refills | Status: DC

## 2019-06-19 NOTE — Telephone Encounter (Signed)
Sent in.  Would offer scheduling CPE towards end of year.

## 2019-06-30 ENCOUNTER — Encounter: Payer: Self-pay | Admitting: Family Medicine

## 2019-06-30 ENCOUNTER — Other Ambulatory Visit: Payer: Self-pay

## 2019-06-30 ENCOUNTER — Ambulatory Visit (INDEPENDENT_AMBULATORY_CARE_PROVIDER_SITE_OTHER): Payer: PPO | Admitting: Family Medicine

## 2019-06-30 VITALS — BP 126/80 | HR 64 | Temp 99.2°F | Ht 65.0 in | Wt 222.4 lb

## 2019-06-30 DIAGNOSIS — L89322 Pressure ulcer of left buttock, stage 2: Secondary | ICD-10-CM

## 2019-06-30 DIAGNOSIS — F01518 Vascular dementia, unspecified severity, with other behavioral disturbance: Secondary | ICD-10-CM

## 2019-06-30 DIAGNOSIS — F0151 Vascular dementia with behavioral disturbance: Secondary | ICD-10-CM

## 2019-06-30 MED ORDER — TEGADERM HYDROCOLLOID EX MISC: 1.0000 | CUTANEOUS | 1 refills | Status: DC

## 2019-06-30 NOTE — Progress Notes (Signed)
This visit was conducted in person.  BP 126/80 (BP Location: Left Arm, Patient Position: Sitting, Cuff Size: Normal)   Pulse 64   Temp 99.2 F (37.3 C) (Temporal)   Ht 5\' 5"  (1.651 m)   Wt 222 lb 6 oz (100.9 kg)   SpO2 99%   BMI 37.01 kg/m    CC: buttock sore Subjective:    Patient ID: [name]., male    DOB: [birth year], 73 y.o.   MRN: [id number]  HPI: Cardale Dorer. is a 73 y.o. male presenting on 06/30/2019 for Skin Lesion (C/o burning irritaited area in crack area of buttocks. Started about 1 mo ago.  Pt accompanied by wife, [name].  Appying colmoseptine oint. )   Saw Dr Diona Browner 04/2019 for virtual visit with possible sacral sore not improving despite desitin cream. Treated with tegaderm hydrocolloid dressing (pharmacy never received this prescription)  Using lift chair to sleep due to sore. Known chronic back pain.  Denies fevers/chills, nausea.   She has been using calmoseptine ointment (menthol and zinc) with benefit.       Relevant past medical, surgical, family and social history reviewed and updated as indicated. Interim medical history since our last visit reviewed. Allergies and medications reviewed and updated. Outpatient Medications Prior to Visit  Medication Sig Dispense Refill  . acetaminophen (TYLENOL) 500 MG tablet Take 500 mg by mouth 3 (three) times daily.    Marland Kitchen amLODipine (NORVASC) 2.5 MG tablet Take 1 tablet by mouth once daily 90 tablet 0  . atorvastatin (LIPITOR) 40 MG tablet Take 1 tablet (40 mg total) by mouth daily. 90 tablet 1  . clotrimazole (LOTRIMIN) 1 % cream Apply 1 application topically 2 (two) times daily. 30 g 0  . cyanocobalamin 500 MCG tablet Take 500 mcg by mouth daily. Vitamin B12    . dextromethorphan (DELSYM) 30 MG/5ML liquid Take 15-60 mg by mouth 2 (two) times daily as needed for cough.    . divalproex (DEPAKOTE SPRINKLE) 125 MG capsule Take 1 capsule (125 mg total) by mouth 2 (two) times daily. 60 capsule 1  .  docusate sodium (COLACE) 100 MG capsule Take 100 mg by mouth daily as needed for mild constipation.    . finasteride (PROSCAR) 5 MG tablet Take 1 tablet by mouth once daily 90 tablet 0  . FOLIC ACID PO Take 3 tablets by mouth 2 (two) times daily.    Marland Kitchen gabapentin (NEURONTIN) 300 MG capsule TAKE 1 CAPSULE BY MOUTH THREE TIMES DAILY 270 capsule 0  . guaiFENesin (MUCINEX) 600 MG 12 hr tablet Take 600-1,200 mg by mouth 2 (two) times daily as needed for cough or to loosen phlegm.    Marland Kitchen HYDROcodone-acetaminophen (NORCO) 10-325 MG per tablet Take 1 tablet by mouth every 6 (six) hours as needed for moderate pain.     Marland Kitchen ipratropium-albuterol (DUONEB) 0.5-2.5 (3) MG/3ML SOLN Take 3 mLs by nebulization every 2 (two) hours as needed. 360 mL 1  . LORazepam (ATIVAN) 0.5 MG tablet Take 0.5-1 tablets (0.25-0.5 mg total) by mouth daily as needed for anxiety. 20 tablet 0  . memantine (NAMENDA XR) 28 MG CP24 24 hr capsule Take 1 capsule (28 mg total) by mouth daily. 30 capsule 8  . metoprolol tartrate (LOPRESSOR) 25 MG tablet Take 1/2 (one-half) tablet by mouth twice daily 90 tablet 0  . polyethylene glycol powder (GLYCOLAX/MIRALAX) powder DISSOLVE 1 CAPFUL (17GM) INTO 4 TO 8 OUNCES OF FLUID AND TAKE BY MOUTH ONCE DAILY AS DIRECTED  255 g 0  . Pyridoxine HCl (VITAMIN B-6 PO) Take 1 tablet by mouth 2 (two) times daily.    Marland Kitchen. triamcinolone cream (KENALOG) 0.1 % Apply 1 application topically 2 (two) times daily. Apply to AA. (Patient taking differently: Apply 1 application topically 2 (two) times daily as needed (rash). Apply to AA.) 30 g 0  . warfarin (COUMADIN) 5 MG tablet TAKE AS DIRECTED BY COUMADIN CLINIC 90 tablet 1  . Hydroactive Dressings (TEGADERM HYDROCOLLOID) MISC Apply 1 each topically once a week. 5 each 1   No facility-administered medications prior to visit.      Per HPI unless specifically indicated in ROS section below Review of Systems Objective:    BP 126/80 (BP Location: Left Arm, Patient Position:  Sitting, Cuff Size: Normal)   Pulse 64   Temp 99.2 F (37.3 C) (Temporal)   Ht 5\' 5"  (1.651 m)   Wt 222 lb 6 oz (100.9 kg)   SpO2 99%   BMI 37.01 kg/m   Wt Readings from Last 3 Encounters:  06/30/19 222 lb 6 oz (100.9 kg)  01/14/19 234 lb 8 oz (106.4 kg)  12/24/18 226 lb 12 oz (102.9 kg)    Physical Exam Vitals signs and nursing note reviewed.  Constitutional:      Appearance: Normal appearance. He is obese. He is ill-appearing.     Comments: Stays in wheelchair, needs assistance to stand  Skin:    General: Skin is warm.     Findings: Wound present.          Comments:  R medial buttock with healed sore L medial buttock with ~1x3cm erosion to skin without surrounding erythema or drainage - this was covered with hydrocolloid dressing, pt tolerated well.   Neurological:     Mental Status: He is alert.     Comments: Tremor present       Results for orders placed or performed in visit on 06/12/19  POCT INR  Result Value Ref Range   INR 2.5 2.0 - 3.0   Lab Results  Component Value Date   HGBA1C 5.8 01/14/2019    Assessment & Plan:   Problem List Items Addressed This Visit    Vascular dementia (HCC) - Primary   Pressure ulcer of left buttock, stage 2 (HCC)    Dry sore present for over a month without surrounding erythema or drainage. Will apply hydrocolloid dressing weekly until healed. Reviewed reasons to seek re evaluation. Wife agrees with plan.           Meds ordered this encounter  Medications  . Hydroactive Dressings (TEGADERM HYDROCOLLOID) MISC    Sig: Apply 1 each topically once a week.    Dispense:  5 each    Refill:  1   No orders of the defined types were placed in this encounter.   Patient Instructions  We placed artificial skin on pressure sore on left buttock. May change once weekly.  Printed out prescription for similar dressings for at home. Let us know if spreading redness or draining pus.  Keep working on regular position changes to help  prevent skin breakdown.    Follow up plan: No follow-ups on file.  Eustaquio BoydenJavier Milanni Ayub, MD

## 2019-06-30 NOTE — Patient Instructions (Addendum)
We placed artificial skin on pressure sore on left buttock. May change once weekly.  Printed out prescription for similar dressings for at home. Let us know if spreading redness or draining pus.  Keep working on regular position changes to help prevent skin breakdown.

## 2019-06-30 NOTE — Assessment & Plan Note (Signed)
Dry sore present for over a month without surrounding erythema or drainage. Will apply hydrocolloid dressing weekly until healed. Reviewed reasons to seek re evaluation. Wife agrees with plan.

## 2019-07-01 NOTE — Telephone Encounter (Signed)
Please call patient and schedule appointment as instructed. 

## 2019-07-01 NOTE — Telephone Encounter (Signed)
Pt scheduled for 10/10/19 CPE.  Pt's wife is asking for a call back from visit yesterday. She said he was having sharp pains and wanting to know if there is something else that can be done or something else she can give him? I let her know that I am not clinical and would have to have a nurse call her back.

## 2019-07-01 NOTE — Telephone Encounter (Addendum)
Returned call to pt's wife, Logan Valdez (on dpr), asking about the pain.  States pt is having occasional sharp pains in area of sore on buttocks.  Also, area had white film covering the area today.  Please advise.

## 2019-07-01 NOTE — Telephone Encounter (Signed)
rec tylenol for discomfort.  hydrocolliod dressing can sometimes turn white at area of sore - should be ok as long as no draining pus or redness around dressing. If concerns, would have her remove dressing and replace with new dressing.

## 2019-07-03 ENCOUNTER — Other Ambulatory Visit: Payer: Self-pay | Admitting: Family Medicine

## 2019-07-03 ENCOUNTER — Encounter: Payer: Self-pay | Admitting: Adult Health

## 2019-07-03 ENCOUNTER — Ambulatory Visit (INDEPENDENT_AMBULATORY_CARE_PROVIDER_SITE_OTHER): Payer: PPO | Admitting: Adult Health

## 2019-07-03 ENCOUNTER — Other Ambulatory Visit: Payer: Self-pay

## 2019-07-03 VITALS — BP 122/67 | HR 59 | Temp 97.1°F | Ht 65.0 in | Wt 222.0 lb

## 2019-07-03 DIAGNOSIS — F01518 Vascular dementia, unspecified severity, with other behavioral disturbance: Secondary | ICD-10-CM

## 2019-07-03 DIAGNOSIS — Z8673 Personal history of transient ischemic attack (TIA), and cerebral infarction without residual deficits: Secondary | ICD-10-CM

## 2019-07-03 DIAGNOSIS — F0151 Vascular dementia with behavioral disturbance: Secondary | ICD-10-CM | POA: Diagnosis not present

## 2019-07-03 MED ORDER — MEMANTINE HCL 10 MG PO TABS: 10.0000 mg | ORAL_TABLET | Freq: Two times a day (BID) | ORAL | 3 refills | Status: DC

## 2019-07-03 MED ORDER — DIVALPROEX SODIUM 125 MG PO CSDR: 125.0000 mg | DELAYED_RELEASE_CAPSULE | Freq: Every day | ORAL | 3 refills | Status: DC

## 2019-07-03 MED ORDER — RIVASTIGMINE 4.6 MG/24HR TD PT24: 4.6000 mg | MEDICATED_PATCH | Freq: Every day | TRANSDERMAL | 2 refills | Status: DC

## 2019-07-03 NOTE — Patient Instructions (Addendum)
Your Plan:  Due to cost, we will change back Namenda to 10 mg twice daily -refill placed Continue Depakote 125 mg nightly and if any worsening behaviors, please call office Due to worsening memory, recommend trialing Exelon 4.6 mg patch daily -additional information provided  Follow-up in 6 months or call earlier if needed     [facility]. I hope we have been able to provide you high quality care today.  You may receive a patient satisfaction survey over the next few weeks. We would appreciate your feedback and comments so that we may continue to improve ourselves and the health of our patients.   Rivastigmine skin patches What is this medicine? RIVASTIGMINE (ri va STIG meen) is used to treat mild to moderate dementia caused by Parkinson's disease and mild to severe Alzheimer's disease. This medicine may be used for other purposes; ask your health care provider or pharmacist if you have questions. COMMON BRAND NAME(S): Exelon Patch What should I tell my health care provider before I take this medicine? They need to know if you have any of these conditions:  application site reaction during previous use of rivastigmine patch  heart disease  kidney disease  liver disease  lung or breathing disease, like asthma  seizures  slow, irregular heartbeat  stomach or intestine disease, ulcers, or stomach bleeding  trouble passing urine  an unusual or allergic reaction to rivastigmine, other medicines, foods, dyes, or preservatives  pregnant or trying to get pregnant  breast-feeding How should I use this medicine? This medicine is for external use only. Follow the directions on the prescription label. Always remove the old patch before you apply a new one. Apply to skin right after removing the protective liner. Do not cut or trim the patch. Apply to an area of the upper arm, chest, or back that is clean, dry and hairless. Avoid  injured, irritated, oily, or calloused areas or where the patch will be rubbed by tight clothing or a waistband. Do not place over an area where lotion, cream, or powder was recently used. Press firmly in place until the edges stick well. To prevent skin irritiation, do not apply to the same place more than once every 14 days. Change your patch at the same time each day. Do not use more often than directed. Do not stop using except on your doctor's advice. Contact your pediatrician or health care professional regarding the use of this medicine in children. Special care may be needed. Overdosage: If you think you have taken too much of this medicine contact a poison control center or emergency room at once. NOTE: This medicine is only for you. Do not share this medicine with others. What if I miss a dose? If you miss a dose, apply a new patch immediately. Then, apply the next patch at the usual time the next day after removing the previous patch. Do not apply 2 patches to make up for the missed one. If treatment is missed for 3 or more days, contact your healthcare provider for further instructions. What may interact with this medicine?  antihistamines for allergy, cough, and cold  atropine  certain medicines for bladder problems like oxybutynin, tolterodine  certain medicines for Parkinson's disease like benztropine, trihexyphenidyl  certain medicines for stomach problems like dicyclomine, hyoscyamine  glycopyrrolate  ipratropium  certain medicines for travel sickness like scopolamine  medicines that relax your muscles for surgery  other medicines for Alzheimer's disease  This list may not describe all possible interactions. Give your health care provider a list of all the medicines, herbs, non-prescription drugs, or dietary supplements you use. Also tell them if you smoke, drink alcohol, or use illegal drugs. Some items may interact with your medicine. What should I watch for while using  this medicine? Visit your doctor or health care professional for regular checks on your progress. Check with your doctor or health care professional if your symptoms do not start to get better or if they get worse. You may get drowsy or dizzy. Do not drive, use machinery, or do anything that needs mental alertness until you know how this drug affects you. Do not stand or sit up quickly, especially if you are an older patient. This reduces the risk of dizzy or fainting spells. Avoid saunas and prolonged exposure to sunlight. You may bathe, swim, shower, or participate in any of your normal activities while wearing this patch. If the patch falls off, apply a new patch for the rest of the day, then replace the patch the next day at the usual time. If you are going to have a magnetic resonance imaging (MRI) procedure, tell your MRI technician if you have this patch on your body. It must be removed before a MRI. What side effects may I notice from receiving this medicine? Side effects that you should report to your doctor or health care professional as soon as possible:  allergic reactions like skin rash, itching or hives, swelling of the face, lips, or tongue  application site reaction (such as skin redness, blisters, or swelling under or around the patch site)  changes in vision or balance  dizziness  feeling faint or lightheaded, falls  increase in frequency of passing urine or incontinence  nervousness, agitation, or increased confusion  redness, blistering, peeling or loosening of the skin, including inside the mouth  severe diarrhea  slow heartbeat or palpitations  stomach pain  sweating  uncontrollable movements  vomiting  weight loss Side effects that usually do not require medical attention (report to your doctor or health care professional if they continue or are bothersome):  headache  indigestion or heartburn  loss of appetite  mild diarrhea, especially when  starting treatment  nausea This list may not describe all possible side effects. Call your doctor for medical advice about side effects. You may report side effects to FDA at 1-800-FDA-1088. Where should I keep my medicine? Keep out of the reach of children. Store at room temperature between 15 and 30 degrees C (59 and 86 degrees F). Store in original pouch until just prior to use. Throw away any unused medicine after the expiration date. Dispose of used patches properly. Since used patches may still contain active medicine, fold the patch in half so that it sticks to itself prior to disposal. NOTE: This sheet is a summary. It may not cover all possible information. If you have questions about this medicine, talk to your doctor, pharmacist, or health care provider.  2020 [employer]/Gold Standard (2015-06-18 15:39:13)

## 2019-07-03 NOTE — Progress Notes (Signed)
[facility]  PATIENT: Logan Valdez. DOB: 24-Apr-1946   REASON FOR VISIT: Follow-up for dementia and history of stroke with risk factors of diabetes hypertension hyperlipidemia. 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 He has risk factors of diabetes and hypertension obesity and hyperlipidemia. At the time of his stroke he was found to have a PFO. He elected not to have it closed.   Update 07/15/2015 PS: He returns for follow-up today complaint by his wife. The patient was unable to afford Namenda XR. 28 mg and hence switched back to Namenda 10 mg twice daily. He is tolerating it well but wife feels subjectively maybe a little bit worse. He continues to have memory and cognitive difficulties but under wife's supervision he does fine. He is mainly bothered by severe low back pain but has not been considered a surgical candidate. He has limited mobility and does not exercise a lot but has lost some weight approximately 13 pounds. He goes to pain management He remains on warfarin which is tolerating well with only minor bruising and no significant bleeding episodes. He had carotid ultrasound done on 07/15/14 show 50-69% left ICA stenosis. He has had no recurrent stroke or TIA symptoms. He is tolerating Lipitor well without any side effects. Last lipid profile was in February . Blood pressure is mildly elevated in the office today 142/78 UPDATE 3/8/17CM Mr. 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 He has risk factors of diabetes and hypertension obesity and hyperlipidemia. He also has a vascular dementia and is currently on Namenda twice daily. His memory is stable. He continues to go to pain management. He is on warfarin. Last carotid ultrasound in September 2015 shows 50-69% left ICA stenosis. He  needs repeat. He has not had recurrent stroke or TIA symptoms. Blood  pressure in good control 120/84 in the  office. Lipids are followed by his primary care and he remains on Lipitor. No falls since last seen. He returns for reevaluation UPDATE 9/8/17CM Mr Valdez, 73 year old male returns for follow-up with his history of stroke event in October 2009 resulting in vascular dementia and is currently on Namenda twice daily. His memory is stable. He has risk factors of diabetes and hypertension obesity and hyperlipidemia. He also has chronic back pain and goes to pain management. Repeat carotid Doppler after last visit in March was unchanged from previous Doppler in September 2015. He has not had recurrent stroke or TIA symptoms. He is presently on Coumadin. Minimal bruising noted on his hands and forearms. Blood pressure mildly elevated in the office today at 144/72. He remains on Lipitor for hyperlipidemia. He denies any myalgias or muscle pain. His labs are followed by primary care. He has not fallen. He returns for reevaluation Update 7/5/2018PS : He returns for follow-up after last visit 9 months ago. He is accompanied by his wife. He was hospitalized recently with episode of confusion and was found to have fever and upper respiratory tract infection. He is also found to have COPD exacerbation was treated with antibiotics and steroids. He has improved though he still has some mild occasional nocturnal cough. The wife reports that he is had further cognitive worsening and requires more help with activities of daily living. His Mini-Mental status score testing today scored 13/30 which is lower than 18/30 at last visit. Is currently  on Namenda 10 mg twice daily and is tolerating well. He has never tried a higher dose of Namenda yet. UPDATE 10/17/2018[name] Valdez, 73 year old male returns for follow-up history of stroke event in 2009 resulting in vascular dementia. He is currently on Coumadin for secondary  stroke prevention without further stroke or TIA symptoms. He had hospitalization in June for upper respiratory tract infection memory worsened after that time. Wife feels it is stable today. He continues to require more assistance with ADLs. MMSE today is 13 out of 30. He was ordered Namenda extended release at his last visit however it appears that was never called in by Dr. Pearlean BrownieSethi. Patient remains on Namenda 10 mg twice daily. Blood pressure the office today 162/72. He remains on Lipitor without myalgias. He has a long history of back pain and gets little exercise. Labs continue to be followed by his primary care provider. Recent CBC and CMP reviewed from 07/17/17. Due to complaints of dizziness his Metoprolol  was decreased to half tablet daily and his gabapentin was decreased to twice a day by PCP Dr. Sharen HonesGutierrez. He returns for reevaluation UPDATE 4/17/2019[name] Valdez, 73 year old male returns for follow-up with his wife who is his caregiver.  He has a history of stroke event in 2009 resulting in vascular dementia.  He remains on Coumadin for secondary stroke prevention without further stroke or TIA symptoms.  His memory has continued to decline.  MMSE today 11 out of 30.  He was switched to Namenda XR at his last visit.  Patient is becoming more dependent on his wife and he does not want her to leave his side.  He requires more assistance with his activities of daily living.  Appetite is good.  Unfortunately he is sleeping during the day so he is awake at night.  She was encouraged to try to keep him from napping during the day so that he can sleep at night.  He also needs to exercise.  Wife is also taking care of her mother who recently broke her hip.  He returns for reevaluation. UPDATE 12/17/2019[name] Valdez, 73 year old male returns for follow-up with his wife who is his caregiver 24/7.  He has a history of vascular dementia resulting from a stroke in 2009.  He is on Coumadin for secondary stroke  prevention without further stroke or TIA symptoms.  His memory has to continue to decline.  MMSE today 12 out of 30 last 11 out of 30.  He remains on Namenda XR 28 mg daily.  Wife complains that he has difficulty waking up some mornings.  He takes gabapentin at night.  She was made aware that that medication can cause  Drowsiness.  She had called into the office in July due to increased agitation and Depakote was given.  Unfortunately he took that for 3 days and had more confusion and agitation however what she did not tell me until today was that he was on Neurontin, Norco, and Ativan all of which can cause confusion.  She also takes care of her elderly mother who has a broken hip.  He continues to require more assistance with his activities of daily living.  He does not want to be left alone.  Unfortunately she does not get much help from her family.  He returns for reevaluation  Update 07/03/2019: Logan Valdez is a 73 year old male who is being seen today for follow-up regarding vascular dementia and is accompanied by his wife.  Memory has been worsening since prior  visit with MMSE today 7/30 (prior 12/30).  Increased Namenda at prior visit from 10 mg twice daily to 28 mg daily but wife did not appreciate any difference.  She is requesting return to 10 mg daily dosing due to cost and not benefiting patient.  After prior visit, recommended to initiate Depakote prescribed at 125 mg twice daily.  He has been receiving 125 mg daily as wife unaware of twice daily dosing but despite daily use, agitation and behaviors have greatly improved.  Wife continues to be his main caregiver being greatly dependent with ADLs.  No further concerns at this time.    REVIEW OF SYSTEMS: Full 14 system review of systems performed and notable only for those listed, all others are neg:  See HPI   ALLERGIES: Allergies  Allergen Reactions   Hydrochlorothiazide Other (See Comments)    Mental changes    Indomethacin      syncope   Pravastatin Sodium     REACTION: rash 11/09   Lisinopril     REACTION: HA   Micardis [Telmisartan]     REACTION: HA    HOME MEDICATIONS: Outpatient Medications Prior to Visit  Medication Sig Dispense Refill   acetaminophen (TYLENOL) 500 MG tablet Take 500 mg by mouth 3 (three) times daily.     amLODipine (NORVASC) 2.5 MG tablet Take 1 tablet by mouth once daily 90 tablet 0   atorvastatin (LIPITOR) 40 MG tablet Take 1 tablet (40 mg total) by mouth daily. 90 tablet 1   clotrimazole (LOTRIMIN) 1 % cream Apply 1 application topically 2 (two) times daily. 30 g 0   cyanocobalamin 500 MCG tablet Take 500 mcg by mouth daily. Vitamin B12     dextromethorphan (DELSYM) 30 MG/5ML liquid Take 15-60 mg by mouth 2 (two) times daily as needed for cough.     divalproex (DEPAKOTE SPRINKLE) 125 MG capsule Take 1 capsule (125 mg total) by mouth 2 (two) times daily. 60 capsule 1   docusate sodium (COLACE) 100 MG capsule Take 100 mg by mouth daily as needed for mild constipation.     finasteride (PROSCAR) 5 MG tablet Take 1 tablet by mouth once daily 90 tablet 0   FOLIC ACID PO Take 3 tablets by mouth 2 (two) times daily.     gabapentin (NEURONTIN) 300 MG capsule TAKE 1 CAPSULE BY MOUTH THREE TIMES DAILY 270 capsule 0   guaiFENesin (MUCINEX) 600 MG 12 hr tablet Take 600-1,200 mg by mouth 2 (two) times daily as needed for cough or to loosen phlegm.     Hydroactive Dressings (TEGADERM HYDROCOLLOID) MISC Apply 1 each topically once a week. 5 each 1   HYDROcodone-acetaminophen (NORCO) 10-325 MG per tablet Take 1 tablet by mouth every 6 (six) hours as needed for moderate pain.      ipratropium-albuterol (DUONEB) 0.5-2.5 (3) MG/3ML SOLN Take 3 mLs by nebulization every 2 (two) hours as needed. 360 mL 1   LORazepam (ATIVAN) 0.5 MG tablet Take 0.5-1 tablets (0.25-0.5 mg total) by mouth daily as needed for anxiety. 20 tablet 0   memantine (NAMENDA XR) 28 MG CP24 24 hr capsule Take 1  capsule (28 mg total) by mouth daily. 30 capsule 8   metoprolol tartrate (LOPRESSOR) 25 MG tablet Take 1/2 (one-half) tablet by mouth twice daily 90 tablet 0   polyethylene glycol powder (GLYCOLAX/MIRALAX) powder DISSOLVE 1 CAPFUL (17GM) INTO 4 TO 8 OUNCES OF FLUID AND TAKE BY MOUTH ONCE DAILY AS DIRECTED 255 g 0   Pyridoxine HCl (  VITAMIN B-6 PO) Take 1 tablet by mouth 2 (two) times daily.     triamcinolone cream (KENALOG) 0.1 % Apply 1 application topically 2 (two) times daily. Apply to AA. (Patient taking differently: Apply 1 application topically 2 (two) times daily as needed (rash). Apply to AA.) 30 g 0   warfarin (COUMADIN) 5 MG tablet TAKE AS DIRECTED BY COUMADIN CLINIC 90 tablet 1   No facility-administered medications prior to visit.     PAST MEDICAL HISTORY: Past Medical History:  Diagnosis Date   Chronic lower back pain    Ramos   DDD (degenerative disc disease), lumbar    Dysmetabolic syndrome X    Gout, unspecified    History of DVT (deep vein thrombosis)    History of pulmonary embolism    History of stroke 2009   Sethi   History of thrombophlebitis    HTN (hypertension)    Memory loss    Mixed hyperlipidemia    Obesity    PFO (patent foramen ovale)    Primary hypercoagulable state (HCC) 05/14/2008   Personal h/o DVT, PE, PFO, and lupus AC +    Stroke (HCC)    Type II or unspecified type diabetes mellitus without mention of complication, uncontrolled    Unspecified sleep apnea     PAST SURGICAL HISTORY: Past Surgical History:  Procedure Laterality Date   COLONOSCOPY  2000   [facility]   WRIST SURGERY     Right wrist repair post trauma    FAMILY HISTORY: Family History  Problem Relation Age of Onset   Emphysema Father    Diabetes Brother    Hypertension Brother    Heart attack Brother [id number]   Skin cancer Brother    Lung cancer Brother    Cancer Maternal Aunt        ? Type    Stroke Neg Hx     SOCIAL HISTORY: Social  History   Socioeconomic History   Marital status: Married    Spouse name: Not on file   Number of children: 4   Years of education: 8 th   Highest education level: Not on file  Occupational History   Not on file  Social Needs   Financial resource strain: Not on file   Food insecurity    Worry: Not on file    Inability: Not on file   Transportation needs    Medical: Not on file    Non-medical: Not on file  Tobacco Use   Smoking status: Never Smoker   Smokeless tobacco: Never Used  Substance and Sexual Activity   Alcohol use: No    Alcohol/week: 0.0 standard drinks   Drug use: No   Sexual activity: Not Currently  Lifestyle   Physical activity    Days per week: Not on file    Minutes per session: Not on file   Stress: Not on file  Relationships   Social connections    Talks on phone: Not on file    Gets together: Not on file    Attends religious service: Not on file    Active member of club or organization: Not on file    Attends meetings of clubs or organizations: Not on file    Relationship status: Not on file   Intimate partner violence    Fear of current or ex partner: Not on file    Emotionally abused: Not on file    Physically abused: Not on file    Forced sexual activity: Not  on file  Other Topics Concern   Not on file  Social History Narrative   Patient is married with 4 children.   Patient is right handed.   Patient has 8 th grade education.   Patient drinks 4-5 cups daily.     PHYSICAL EXAM  Vitals:   07/03/19 1450  BP: 122/67  Pulse: (!) 59  Temp: (!) 97.1 F (36.2 C)  Weight: 222 lb (100.7 kg)  Height: 5\' 5"  (1.651 m)   Body mass index is 36.94 kg/m.  MMSE - Mini Mental State Exam 07/03/2019 01/15/2019 10/22/2018  Not completed: - (No Data) (No Data)  Orientation to time 0 - 3  Orientation to Place 0 - 1  Registration 2 - 3  Attention/ Calculation 0 - 0  Recall 0 - 0  Language- name 2 objects 2 - 2  Language- repeat  1 - 1  Language- follow 3 step command 2 - 1  Language- read & follow direction 0 - 1  Write a sentence 0 - 0  Copy design 0 - 0  Total score 7 - 12     Generalized: Well developed, pleasant elderly obese male in no acute distress  Head: normocephalic and atraumatic,. Oropharynx benign  Neck: Supple, no carotid bruits  Cardiac: Regular rate rhythm, no murmur  Musculoskeletal: No deformity   Neurological examination   Mentation: Alert. Follows commands speech  Slow with occasional hesitancy and language fluent.  MMSE above. Cranial nerve II-XII: Pupils were equal round reactive to light extraocular movements were full, visual field were full on confrontational test. Facial sensation and strength were normal. hearing was intact to finger rubbing bilaterally. Uvula tongue midline. head turning and shoulder shrug were normal and symmetric.Tongue protrusion into cheek strength was normal. Motor: normal bulk and tone, full strength in the BUE with mild BLE hip flexor weakness Sensory: normal and symmetric to light touch,   Coordination: finger-nose-finger performed with mild dysmetria unable to perform heel to shin, Reflexes: Symmetric upper and lower plantar responses were flexor bilaterally. Gait and Station: Currently in wheelchair therefore gait assessment deferred   DIAGNOSTIC DATA (LABS, IMAGING, TESTING) - I reviewed patient records, labs, notes, testing and imaging myself where available.  Lab Results  Component Value Date   WBC 6.3 12/25/2017   HGB 14.0 12/25/2017   HCT 41.9 12/25/2017   MCV 90.3 12/25/2017   PLT 171.0 12/25/2017      Component Value Date/Time   NA 141 01/14/2019 1134   K 4.0 01/14/2019 1134   CL 104 01/14/2019 1134   CO2 27 01/14/2019 1134   GLUCOSE 89 01/14/2019 1134   BUN 16 01/14/2019 1134   CREATININE 0.77 01/14/2019 1134   CALCIUM 9.0 01/14/2019 1134   PROT 6.7 01/14/2019 1134   ALBUMIN 4.1 01/14/2019 1134   AST 25 01/14/2019 1134   ALT 25  01/14/2019 1134   ALKPHOS 113 01/14/2019 1134   BILITOT 1.1 01/14/2019 1134   GFRNONAA >60 02/19/2017 0415   GFRAA >60 02/19/2017 0415   Lab Results  Component Value Date   CHOL 126 01/14/2019   HDL 57.60 01/14/2019   LDLCALC 51 01/14/2019   LDLDIRECT 114.0 12/25/2017   TRIG 90.0 01/14/2019   CHOLHDL 2 01/14/2019   Lab Results  Component Value Date   HGBA1C 5.8 01/14/2019   Lab Results  Component Value Date   VITAMINB12 1,441 (H) 01/14/2019   Lab Results  Component Value Date   TSH 1.01 12/25/2017  ASSESSMENT AND PLAN 73 y.o. year old male has a past medical history of Obesity; Dysmetabolic syndrome X; Unspecified sleep apnea; Mixed hyperlipidemia; Type II or unspecified type diabetes mellitus without mention of complication, uncontrolled; History of pulmonary embolism; History of DVT (deep vein thrombosis); History of thrombophlebitis; HTN (hypertension); History of stroke (2009); PFO (patent foramen ovale); Chronic lower back pain; DDD (degenerative disc disease), lumbar; and Primary hypercoagulable state (05/14/2008). here to follow-up for memory loss which has been declining steadily throughout follow-up visits.  PLAN: Return to Namenda 10 mg daily as requested per wife as no benefit with increased dose 28 mg daily and cost Due to worsening memory, initiate Exelon 4.6 mg per 24-hour patch -additional information provided Continue Depakote 125 mg nightly -refill provided Continue warfarin and atorvastatin for secondary stroke prevention  Strict control of hypertension with blood pressure goal below 130/90, lipids with LDL cholesterol goal below 70 mg percent  Follow-up in 6 months or call earlier if needed  I spent 25 min in total face to face time with the patient more than 50% of which was spent counseling and coordination of care,  reviewing medications and discussing and reviewing the diagnosis of dementia with ongoing decline and trial of additional medications.   Answered all questions to patient and husband satisfaction.  Frann Rider [facility]), AGNP-BC  [facility] [address] [place], [place] [address]  Phone [phone] Fax [phone] Note: This document was prepared with digital dictation and possible smart phrase technology. Any transcriptional errors that result from this process are unintentional.

## 2019-07-04 NOTE — Telephone Encounter (Signed)
Please review for Dr Darnell Level. LOV 06/30/2019 for ulcers. Future appointment on 10/10/2019. Last filled on 12/20/2018 for #[id number] with 0 refills.

## 2019-07-04 NOTE — Telephone Encounter (Signed)
Ref once in pcp absence

## 2019-07-04 NOTE — Telephone Encounter (Signed)
Patient is compliant with coumadin management will refill X 6 months.   

## 2019-07-05 ENCOUNTER — Encounter: Payer: Self-pay | Admitting: Adult Health

## 2019-07-07 NOTE — Progress Notes (Signed)
I agree with the above plan 

## 2019-07-11 NOTE — Telephone Encounter (Signed)
Spoke with pt's wife, [name] (on dpr) relaying Dr. Synthia Innocent message.  Verbalizes understanding.

## 2019-07-17 ENCOUNTER — Ambulatory Visit: Payer: PPO

## 2019-08-05 ENCOUNTER — Telehealth: Payer: Self-pay

## 2019-08-05 DIAGNOSIS — T50905A Adverse effect of unspecified drugs, medicaments and biological substances, initial encounter: Secondary | ICD-10-CM | POA: Diagnosis not present

## 2019-08-05 DIAGNOSIS — F01518 Vascular dementia, unspecified severity, with other behavioral disturbance: Secondary | ICD-10-CM

## 2019-08-05 DIAGNOSIS — F0151 Vascular dementia with behavioral disturbance: Secondary | ICD-10-CM

## 2019-08-05 NOTE — Telephone Encounter (Signed)
[facility] - Client TELEPHONE ADVICE RECORD [facility] Patient Name: Logan Valdez Gender: Male DOB: [birth year] Age: 73 Y 11 M 2 D Return Phone Number: [phone] (Primary), [phone] (Secondary) Address[place] [address] Client [facility] - Client Client Site [place] Physician Ria Bush - MD Contact Type Call Who Is Calling Patient / Member / Family / Caregiver Call Type Triage / Clinical Caller Name Wofford Stratton Relationship To Patient Spouse Return Phone Number [phone] (Primary) Chief Complaint Stool - Unusual Color Reason for Call Symptomatic / Request for Health Information Initial Comment Caller reports her husband is having a white discharge when trying to use the bathroom; states he couldn't make it to the bathroom and white stuff was running down his legs. Caller states her husband does have dementia and has had a stroke. Translation No Nurse Assessment Nurse: Jerene Bears, RN, Will Date/Time Eilene Ghazi Time): 08/04/2019 5:31:01 PM Confirm and document reason for call. If symptomatic, describe symptoms. ---Caller reports that her husband has been passing significant amount of white discharge (that she believes) to be coming from his anus. No enemas or suppositories given. Recent treatment for constipation with mirilax, now having loose stools. Increased bowel sounds. Has the patient had close contact with a person known or suspected to have the novel coronavirus illness OR traveled / lives in area with major community spread (including international travel) in the last 14 days from the onset of symptoms? * If Asymptomatic, screen for exposure and travel within the last 14 days. ---No Does the patient have any new or worsening symptoms? ---Yes Will a triage be completed? ---Yes Related visit to physician within the last 2 weeks?  ---Yes Does the PT have any chronic conditions? (i.e. diabetes, asthma, this includes High risk factors for pregnancy, etc.) ---Yes List chronic conditions. ---dementia, hx of stroke, Parkinson's disease, Is this a behavioral health or substance abuse call? ---No PLEASE NOTE: All timestamps contained within this report are represented as [place] Standard Time. CONFIDENTIALTY NOTICE: This fax transmission is intended only for the addressee. It contains information that is legally privileged, confidential or otherwise protected from use or disclosure. If you are not the intended recipient, you are strictly prohibited from reviewing, disclosing, copying using or disseminating any of this information or taking any action in reliance on or regarding this information. If you have received this fax in error, please notify us immediately by telephone so that we can arrange for its return to [place]. Phone: [phone], Toll-Free: [phone], Fax: [phone] Page: 2 of 2 Call Id: [id number] Guidelines Guideline Title Affirmed Question Affirmed Notes Nurse Date/Time Eilene Ghazi Time) Stools - Unusual Color [1] Stool is light gray or whitish AND [2] unexplained Jerene Bears, RN, Will 08/04/2019 5:38:10 PM Disp. Time Eilene Ghazi Time) Disposition Final User 08/04/2019 5:44:33 PM See PCP within 24 Hours Yes Jerene Bears, RN, Will Caller Disagree/Comply Comply Caller Understands Yes PreDisposition Call Doctor Care Advice Given Per Guideline SEE PCP WITHIN 24 HOURS: * IF OFFICE WILL BE OPEN: You need to be seen within the next 24 hours. Call your doctor (or NP/PA) when the office opens and make an appointment. TREATMENT: No treatment is needed at this time. CALL BACK IF: * You become worse. CARE ADVICE given per Stools - Unusual Color (Adult) guideline Comments User: Janeece Agee, RN Date/Time Eilene Ghazi Time): 08/04/2019 5:43:20 PM Current oral temperature of 98.2. Referrals REFERRED TO PCP OFFICE

## 2019-08-05 NOTE — Telephone Encounter (Signed)
I spoke with Mrs Koelling (DPR signed) pt started with white discharge that is gel like and mucus thinks from rectum; had diarrhea past weekend; pt has constipation issues. Last had this yesterday afternoon. Pt also has difficulty in starting to urinate; yesterday took pt 10 mins to start to urinate. Pt has dry cough on and off for one month.Pt has no covid symptoms except diarrhea and cough;  no travel and no known exposure to + covid. Pt said for 2 days stomach was "growling", not sure if any abd pain; pt has back pain all the time because cannot have back surgery.No N&V. pts wife cannot do a virtual visit and daughter cannot help her right now. Mrs Powell will take pt to [facility] in [place]. FYI to Dr Darnell Level.

## 2019-08-05 NOTE — Telephone Encounter (Signed)
Noted  

## 2019-08-05 NOTE — Telephone Encounter (Signed)
Noted. Will await [facility] eval. plz call tomorrow for an update.

## 2019-08-06 ENCOUNTER — Telehealth: Payer: Self-pay | Admitting: Adult Health

## 2019-08-06 NOTE — Telephone Encounter (Signed)
Spoke with pt's wife, [name] (on dpr), asking how pt is doing.  Says pt was having a reaction to rivastigmine patch.  Pt has stopped patch.  Says pt is doing a lot better.

## 2019-08-06 NOTE — Telephone Encounter (Signed)
Pt's wife called needing to speak to the provider about the pt's rivastigmine (EXELON) 4.6 mg/24hr. Pt was having symptoms. Please advise.

## 2019-08-07 NOTE — Telephone Encounter (Signed)
Allergy list has been updated.

## 2019-08-07 NOTE — Telephone Encounter (Signed)
Noted  

## 2019-08-07 NOTE — Telephone Encounter (Signed)
Spoke to wife, after using exelon patch for about week, pt had skin reaction, but also mucousy watery diarrhea.  Stopped after going to [facility] [place] urgent care and he is better now.  Continues with memantine.

## 2019-08-07 NOTE — Telephone Encounter (Signed)
Thank you for update.  Please list Exelon patch under intolerance/allergies.  Thank you.

## 2019-08-07 NOTE — Telephone Encounter (Signed)
Done

## 2019-08-12 DIAGNOSIS — H04123 Dry eye syndrome of bilateral lacrimal glands: Secondary | ICD-10-CM | POA: Diagnosis not present

## 2019-08-12 DIAGNOSIS — H0102B Squamous blepharitis left eye, upper and lower eyelids: Secondary | ICD-10-CM | POA: Diagnosis not present

## 2019-08-12 DIAGNOSIS — H0102A Squamous blepharitis right eye, upper and lower eyelids: Secondary | ICD-10-CM | POA: Diagnosis not present

## 2019-08-12 DIAGNOSIS — H2513 Age-related nuclear cataract, bilateral: Secondary | ICD-10-CM | POA: Diagnosis not present

## 2019-08-20 ENCOUNTER — Other Ambulatory Visit: Payer: Self-pay

## 2019-08-20 ENCOUNTER — Encounter: Payer: Self-pay | Admitting: Family Medicine

## 2019-08-20 ENCOUNTER — Ambulatory Visit (INDEPENDENT_AMBULATORY_CARE_PROVIDER_SITE_OTHER): Payer: PPO | Admitting: Family Medicine

## 2019-08-20 VITALS — BP 124/80 | HR 60 | Temp 97.8°F | Ht 65.0 in | Wt 219.6 lb

## 2019-08-20 DIAGNOSIS — Z23 Encounter for immunization: Secondary | ICD-10-CM

## 2019-08-20 DIAGNOSIS — L89321 Pressure ulcer of left buttock, stage 1: Secondary | ICD-10-CM

## 2019-08-20 NOTE — Assessment & Plan Note (Signed)
Actually seems some improved, however with thickened skin surrounding initial sore noted to left buttock. rec stop hydrocolloid, restart barrier cream (desitin or boudreaux) + OTC hydrocodone cream BID, red flags to seek further care reviewed.

## 2019-08-20 NOTE — Progress Notes (Signed)
This visit was conducted in person.  BP 124/80 (BP Location: Left Arm, Patient Position: Sitting, Cuff Size: Large)    Pulse 60    Temp 97.8 F (36.6 C) (Temporal)    Ht 5\' 5"  (1.651 m)    Wt 219 lb 9 oz (99.6 kg)    SpO2 100%    BMI 36.54 kg/m    CC: check buttock sore Subjective:    Patient ID: ., male    DOB: 02-26-1946, 73 y.o.   MRN: [id number]  HPI: Logan Valdez. is a 73 y.o. male presenting on 08/20/2019 for Cyst (C/o sore on buttock has not improved.  Per pt's wife, pt sits a lot and c/o tenderness or stinging in the area. Pt accompanied by wife, 08/22/2019. )   See prior note for details.  Seen here 06/2019 with concern for sacral sore - treated with desitin cream, then hydrocolloid dressing weekly applications without significant improvement. Has also used calmoseptine ointment (menthol and zinc).  Wife has had trouble applying hydrocolloid patches - states they don't adhere well.  Using lift chair to sleep due to sore.  Ongoing skin change since 04/2019.        Relevant past medical, surgical, family and social history reviewed and updated as indicated. Interim medical history since our last visit reviewed. Allergies and medications reviewed and updated. Outpatient Medications Prior to Visit  Medication Sig Dispense Refill   acetaminophen (TYLENOL) 500 MG tablet Take 500 mg by mouth 3 (three) times daily.     amLODipine (NORVASC) 2.5 MG tablet Take 1 tablet by mouth once daily 90 tablet 1   atorvastatin (LIPITOR) 40 MG tablet Take 1 tablet (40 mg total) by mouth daily. 90 tablet 1   clotrimazole (LOTRIMIN) 1 % cream Apply 1 application topically 2 (two) times daily. 30 g 0   cyanocobalamin 500 MCG tablet Take 500 mcg by mouth daily. Vitamin B12     dextromethorphan (DELSYM) 30 MG/5ML liquid Take 15-60 mg by mouth 2 (two) times daily as needed for cough.     divalproex (DEPAKOTE SPRINKLE) 125 MG capsule Take 1 capsule (125 mg total) by mouth  at bedtime. 30 capsule 3   docusate sodium (COLACE) 100 MG capsule Take 100 mg by mouth daily as needed for mild constipation.     finasteride (PROSCAR) 5 MG tablet Take 1 tablet by mouth once daily 90 tablet 1   FOLIC ACID PO Take 3 tablets by mouth 2 (two) times daily.     gabapentin (NEURONTIN) 300 MG capsule TAKE 1 CAPSULE BY MOUTH THREE TIMES DAILY 270 capsule 0   guaiFENesin (MUCINEX) 600 MG 12 hr tablet Take 600-1,200 mg by mouth 2 (two) times daily as needed for cough or to loosen phlegm.     Hydroactive Dressings (TEGADERM HYDROCOLLOID) MISC Apply 1 each topically once a week. 5 each 1   HYDROcodone-acetaminophen (NORCO) 10-325 MG per tablet Take 1 tablet by mouth every 6 (six) hours as needed for moderate pain.      ipratropium-albuterol (DUONEB) 0.5-2.5 (3) MG/3ML SOLN Take 3 mLs by nebulization every 2 (two) hours as needed. 360 mL 1   LORazepam (ATIVAN) 0.5 MG tablet Take 0.5-1 tablets (0.25-0.5 mg total) by mouth daily as needed for anxiety. 20 tablet 0   memantine (NAMENDA) 10 MG tablet Take 1 tablet (10 mg total) by mouth 2 (two) times daily. 60 tablet 3   metoprolol tartrate (LOPRESSOR) 25 MG tablet Take 1/2 (one-half) tablet  by mouth twice daily 90 tablet 0   polyethylene glycol powder (GLYCOLAX/MIRALAX) powder DISSOLVE 1 CAPFUL (17GM) INTO 4 TO 8 OUNCES OF FLUID AND TAKE BY MOUTH ONCE DAILY AS DIRECTED 255 g 0   Pyridoxine HCl (VITAMIN B-6 PO) Take 1 tablet by mouth 2 (two) times daily.     triamcinolone cream (KENALOG) 0.1 % Apply 1 application topically 2 (two) times daily. Apply to AA. (Patient taking differently: Apply 1 application topically 2 (two) times daily as needed (rash). Apply to AA.) 30 g 0   warfarin (COUMADIN) 5 MG tablet TAKE AS DIRECTED BY  COUMADIN  CLINIC 90 tablet 1   No facility-administered medications prior to visit.      Per HPI unless specifically indicated in ROS section below Review of Systems Objective:    BP 124/80 (BP Location:  Left Arm, Patient Position: Sitting, Cuff Size: Large)    Pulse 60    Temp 97.8 F (36.6 C) (Temporal)    Ht 5\' 5"  (1.651 m)    Wt 219 lb 9 oz (99.6 kg)    SpO2 100%    BMI 36.54 kg/m   Wt Readings from Last 3 Encounters:  08/20/19 219 lb 9 oz (99.6 kg)  07/03/19 222 lb (100.7 kg)  06/30/19 222 lb 6 oz (100.9 kg)    Physical Exam Vitals signs and nursing note reviewed.  Constitutional:      General: He is not in acute distress.    Appearance: Normal appearance. He is obese. He is ill-appearing (chronic).  Skin:         Comments: Bilateral medial lower buttock with mild erythema and skin peeling, L buttock with thickened hyperkeratotic skin with central small dry erosion  Neurological:     Mental Status: He is alert.       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 rec stop hydrocolloid, restart barrier cream (desitin or boudreaux) + OTC hydrocodone cream BID, red flags to seek further care reviewed.        Other Visit Diagnoses    Need for influenza vaccination       Relevant Orders   Flu Vaccine QUAD High Dose(Fluad) (Completed)       No orders of the defined types were placed in this encounter.  Orders Placed This Encounter  Procedures   Flu Vaccine QUAD High Dose(Fluad)    Patient Instructions  Flu shot today I think sore is healing ok- just one small spot that remains open. Stop hydrocolloid dressings, restart barrier cream (desitin or boudreaux butt paste). May add a little cortisone-10 to dry irritated skin areas.  Still watch for spreading redness or enlarging sore.    Follow up plan: No follow-ups on file.  Ria Bush, MD

## 2019-08-20 NOTE — Patient Instructions (Addendum)
Flu shot today I think sore is healing ok- just one small spot that remains open. Stop hydrocolloid dressings, restart barrier cream (desitin or boudreaux butt paste). May add a little cortisone-10 to dry irritated skin areas.  Still watch for spreading redness or enlarging sore.

## 2019-09-08 ENCOUNTER — Other Ambulatory Visit: Payer: Self-pay | Admitting: Family Medicine

## 2019-09-08 NOTE — Telephone Encounter (Signed)
To soon to fill not due until the end of Nov

## 2019-09-18 ENCOUNTER — Ambulatory Visit: Payer: PPO

## 2019-09-25 ENCOUNTER — Other Ambulatory Visit: Payer: Self-pay

## 2019-09-25 ENCOUNTER — Ambulatory Visit (INDEPENDENT_AMBULATORY_CARE_PROVIDER_SITE_OTHER): Payer: PPO | Admitting: General Practice

## 2019-09-25 DIAGNOSIS — Z7901 Long term (current) use of anticoagulants: Secondary | ICD-10-CM

## 2019-09-25 DIAGNOSIS — Z7902 Long term (current) use of antithrombotics/antiplatelets: Secondary | ICD-10-CM

## 2019-09-25 LAB — POCT INR: INR: 3.2 — AB (ref 2.0–3.0)

## 2019-09-25 NOTE — Patient Instructions (Addendum)
Pre visit review using our clinic review tool, if applicable. No additional management support is needed unless otherwise documented below in the visit note.  Hold coumadin today (11/19) and then please continue to take 1 tablet daily except 1/2 tablet on Monday and Fridays.  Re-check in 4 weeks.  Dosing instructions given to patient' wife and she verbalized understanding.

## 2019-10-06 ENCOUNTER — Ambulatory Visit (INDEPENDENT_AMBULATORY_CARE_PROVIDER_SITE_OTHER): Payer: PPO | Admitting: Family Medicine

## 2019-10-06 ENCOUNTER — Other Ambulatory Visit: Payer: Self-pay

## 2019-10-06 ENCOUNTER — Encounter: Payer: Self-pay | Admitting: Family Medicine

## 2019-10-06 DIAGNOSIS — L853 Xerosis cutis: Secondary | ICD-10-CM | POA: Insufficient documentation

## 2019-10-06 MED ORDER — TRIAMCINOLONE ACETONIDE 0.1 % EX CREA: 1.0000 "application " | TOPICAL_CREAM | Freq: Two times a day (BID) | CUTANEOUS | 0 refills | Status: DC | PRN

## 2019-10-06 NOTE — Patient Instructions (Signed)
You have dry skin dermatitis or irritation from too dry skin.  Caution with too hot showers, limit showers especially during winter.  Treat with regular moisturizing throughout the day with good moisturizing cream like aveeno or eucerin or cerave.  May use triamcinolone steroid cream as needed to itchy spots, once daily for no more than 2 weeks straight.

## 2019-10-06 NOTE — Progress Notes (Signed)
This visit was conducted in person.  BP 118/66 (BP Location: Left Arm, Patient Position: Sitting, Cuff Size: Large)   Pulse 62   Temp 97.6 F (36.4 C) (Temporal)   Ht 5\' 5"  (1.651 m)   Wt 212 lb (96.2 kg)   SpO2 98%   BMI 35.28 kg/m    CC: rash Subjective:    Patient ID: ., male    DOB: Mar 18, 1946, 73 y.o.   MRN: [id number]  HPI: Yisrael Obryan. is a 73 y.o. male presenting on 10/06/2019 for Rash (C/o rash on bilateral hips.  Says areas itch.  Pt accompanied by wife, 10/08/2019, 98.1].)   L buttock pressure ulcer healing well.   Itchy raised rash on bilateral lower back present for the past month.   No fevers. No new medicines vitamins, supplements.  No new joint pains.   Previous rash also present. Currently treating with desitin and triple abx ointment with benefit. Has triamcinolone cream but hasn't used recently.   Wife gives history due to dementia. Uses rollator to ambulate but didn't bring today.      Relevant past medical, surgical, family and social history reviewed and updated as indicated. Interim medical history since our last visit reviewed. Allergies and medications reviewed and updated. Outpatient Medications Prior to Visit  Medication Sig Dispense Refill  . acetaminophen (TYLENOL) 500 MG tablet Take 500 mg by mouth 3 (three) times daily.    Earlean Shawl amLODipine (NORVASC) 2.5 MG tablet Take 1 tablet by mouth once daily 90 tablet 1  . atorvastatin (LIPITOR) 40 MG tablet Take 1 tablet (40 mg total) by mouth daily. 90 tablet 1  . clotrimazole (LOTRIMIN) 1 % cream Apply 1 application topically 2 (two) times daily. 30 g 0  . cyanocobalamin 500 MCG tablet Take 500 mcg by mouth daily. Vitamin B12    . dextromethorphan (DELSYM) 30 MG/5ML liquid Take 15-60 mg by mouth 2 (two) times daily as needed for cough.    . divalproex (DEPAKOTE SPRINKLE) 125 MG capsule Take 1 capsule (125 mg total) by mouth at bedtime. 30 capsule 3  . docusate sodium  (COLACE) 100 MG capsule Take 100 mg by mouth daily as needed for mild constipation.    . finasteride (PROSCAR) 5 MG tablet Take 1 tablet by mouth once daily 90 tablet 1  . FOLIC ACID PO Take 3 tablets by mouth 2 (two) times daily.    Marland Kitchen gabapentin (NEURONTIN) 300 MG capsule TAKE 1 CAPSULE BY MOUTH THREE TIMES DAILY 270 capsule 0  . guaiFENesin (MUCINEX) 600 MG 12 hr tablet Take 600-1,200 mg by mouth 2 (two) times daily as needed for cough or to loosen phlegm.    . Hydroactive Dressings (TEGADERM HYDROCOLLOID) MISC Apply 1 each topically once a week. 5 each 1  . HYDROcodone-acetaminophen (NORCO) 10-325 MG per tablet Take 1 tablet by mouth every 6 (six) hours as needed for moderate pain.     Marland Kitchen ipratropium-albuterol (DUONEB) 0.5-2.5 (3) MG/3ML SOLN Take 3 mLs by nebulization every 2 (two) hours as needed. 360 mL 1  . LORazepam (ATIVAN) 0.5 MG tablet Take 0.5-1 tablets (0.25-0.5 mg total) by mouth daily as needed for anxiety. 20 tablet 0  . memantine (NAMENDA) 10 MG tablet Take 1 tablet (10 mg total) by mouth 2 (two) times daily. 60 tablet 3  . metoprolol tartrate (LOPRESSOR) 25 MG tablet Take 1/2 (one-half) tablet by mouth twice daily 90 tablet 0  . polyethylene glycol powder (GLYCOLAX/MIRALAX) powder DISSOLVE  1 CAPFUL (17GM) INTO 4 TO 8 OUNCES OF FLUID AND TAKE BY MOUTH ONCE DAILY AS DIRECTED 255 g 0  . Pyridoxine HCl (VITAMIN B-6 PO) Take 1 tablet by mouth 2 (two) times daily.    Marland Kitchen warfarin (COUMADIN) 5 MG tablet TAKE AS DIRECTED BY  COUMADIN  CLINIC 90 tablet 1  . triamcinolone cream (KENALOG) 0.1 % Apply 1 application topically 2 (two) times daily. Apply to AA. (Patient taking differently: Apply 1 application topically 2 (two) times daily as needed (rash). Apply to AA.) 30 g 0   No facility-administered medications prior to visit.      Per HPI unless specifically indicated in ROS section below Review of Systems Objective:    BP 118/66 (BP Location: Left Arm, Patient Position: Sitting, Cuff  Size: Large)   Pulse 62   Temp 97.6 F (36.4 C) (Temporal)   Ht 5\' 5"  (1.651 m)   Wt 212 lb (96.2 kg)   SpO2 98%   BMI 35.28 kg/m   Wt Readings from Last 3 Encounters:  10/06/19 212 lb (96.2 kg)  08/20/19 219 lb 9 oz (99.6 kg)  07/03/19 222 lb (100.7 kg)    Physical Exam Vitals signs and nursing note reviewed.  Constitutional:      Appearance: Normal appearance. He is obese. He is not ill-appearing.  Skin:    General: Skin is warm and dry.     Findings: Rash present. No erythema.     Comments: Faint scaly dry skin to R lower back without papules or erythema  Neurological:     Mental Status: He is alert.       Results for orders placed or performed in visit on 09/25/19  POCT INR  Result Value Ref Range   INR 3.2 (A) 2.0 - 3.0   Assessment & Plan:  This visit occurred during the SARS-CoV-2 public health emergency.  Safety protocols were in place, including screening questions prior to the visit, additional usage of staff PPE, and extensive cleaning of exam room while observing appropriate contact time as indicated for disinfecting solutions.   Problem List Items Addressed This Visit    Xerosis of skin    To lower back. Home care reviewed. rec moisturizing throughout day, limit hot showers, Rx trimacinolone PRN with indications how to use topical steroids. Wife agrees with treatment plan.           Meds ordered this encounter  Medications  . triamcinolone cream (KENALOG) 0.1 %    Sig: Apply 1 application topically 2 (two) times daily as needed (dry skin with rash). Apply to AA.    Dispense:  453.6 g    Refill:  0   No orders of the defined types were placed in this encounter.  Patient Instructions  You have dry skin dermatitis or irritation from too dry skin.  Caution with too hot showers, limit showers especially during winter.  Treat with regular moisturizing throughout the day with good moisturizing cream like aveeno or eucerin or cerave.  May use triamcinolone  steroid cream as needed to itchy spots, once daily for no more than 2 weeks straight.     Follow up plan: No follow-ups on file.  Ria Bush, MD

## 2019-10-06 NOTE — Assessment & Plan Note (Signed)
To lower back. Home care reviewed. rec moisturizing throughout day, limit hot showers, Rx trimacinolone PRN with indications how to use topical steroids. Wife agrees with treatment plan.

## 2019-10-10 ENCOUNTER — Encounter: Payer: PPO | Admitting: Family Medicine

## 2019-10-23 ENCOUNTER — Ambulatory Visit (INDEPENDENT_AMBULATORY_CARE_PROVIDER_SITE_OTHER): Payer: PPO | Admitting: General Practice

## 2019-10-23 ENCOUNTER — Other Ambulatory Visit: Payer: Self-pay

## 2019-10-23 DIAGNOSIS — Z7901 Long term (current) use of anticoagulants: Secondary | ICD-10-CM | POA: Diagnosis not present

## 2019-10-23 DIAGNOSIS — Z7902 Long term (current) use of antithrombotics/antiplatelets: Secondary | ICD-10-CM

## 2019-10-23 LAB — POCT INR: INR: 3.6 — AB (ref 2.0–3.0)

## 2019-10-23 NOTE — Patient Instructions (Addendum)
Pre visit review using our clinic review tool, if applicable. No additional management support is needed unless otherwise documented below in the visit note.  Hold coumadin today (12/17) and then change dosage and take 1 tablet daily except 1/2 tablet on Monday Wed and Fridays.  Re-check in 3 weeks.  Dosing instructions given to patient' wife and she verbalized understanding.

## 2019-10-24 ENCOUNTER — Ambulatory Visit (INDEPENDENT_AMBULATORY_CARE_PROVIDER_SITE_OTHER): Payer: PPO | Admitting: Family Medicine

## 2019-10-24 ENCOUNTER — Encounter: Payer: Self-pay | Admitting: Family Medicine

## 2019-10-24 ENCOUNTER — Telehealth: Payer: Self-pay | Admitting: Family Medicine

## 2019-10-24 VITALS — BP 124/66 | HR 55 | Temp 97.8°F | Ht 63.0 in | Wt 213.6 lb

## 2019-10-24 DIAGNOSIS — Z7902 Long term (current) use of antithrombotics/antiplatelets: Secondary | ICD-10-CM | POA: Diagnosis not present

## 2019-10-24 DIAGNOSIS — I63219 Cerebral infarction due to unspecified occlusion or stenosis of unspecified vertebral arteries: Secondary | ICD-10-CM

## 2019-10-24 DIAGNOSIS — G8929 Other chronic pain: Secondary | ICD-10-CM

## 2019-10-24 DIAGNOSIS — M1A9XX0 Chronic gout, unspecified, without tophus (tophi): Secondary | ICD-10-CM | POA: Diagnosis not present

## 2019-10-24 DIAGNOSIS — G4733 Obstructive sleep apnea (adult) (pediatric): Secondary | ICD-10-CM | POA: Diagnosis not present

## 2019-10-24 DIAGNOSIS — I10 Essential (primary) hypertension: Secondary | ICD-10-CM

## 2019-10-24 DIAGNOSIS — E538 Deficiency of other specified B group vitamins: Secondary | ICD-10-CM

## 2019-10-24 DIAGNOSIS — L853 Xerosis cutis: Secondary | ICD-10-CM

## 2019-10-24 DIAGNOSIS — K5903 Drug induced constipation: Secondary | ICD-10-CM

## 2019-10-24 DIAGNOSIS — E118 Type 2 diabetes mellitus with unspecified complications: Secondary | ICD-10-CM

## 2019-10-24 DIAGNOSIS — Z7189 Other specified counseling: Secondary | ICD-10-CM

## 2019-10-24 DIAGNOSIS — D6859 Other primary thrombophilia: Secondary | ICD-10-CM

## 2019-10-24 DIAGNOSIS — Z0000 Encounter for general adult medical examination without abnormal findings: Secondary | ICD-10-CM

## 2019-10-24 DIAGNOSIS — M545 Low back pain: Secondary | ICD-10-CM

## 2019-10-24 DIAGNOSIS — E782 Mixed hyperlipidemia: Secondary | ICD-10-CM | POA: Diagnosis not present

## 2019-10-24 DIAGNOSIS — J449 Chronic obstructive pulmonary disease, unspecified: Secondary | ICD-10-CM

## 2019-10-24 DIAGNOSIS — Z86718 Personal history of other venous thrombosis and embolism: Secondary | ICD-10-CM

## 2019-10-24 DIAGNOSIS — F01518 Vascular dementia, unspecified severity, with other behavioral disturbance: Secondary | ICD-10-CM

## 2019-10-24 DIAGNOSIS — F0151 Vascular dementia with behavioral disturbance: Secondary | ICD-10-CM

## 2019-10-24 NOTE — Assessment & Plan Note (Signed)
Chronic, stable. Continue current regimen. 

## 2019-10-24 NOTE — Assessment & Plan Note (Signed)
Chronic, stable off medication. No recent gout flares.

## 2019-10-24 NOTE — Assessment & Plan Note (Signed)
This has improved with regular moisturizing and PRN triamcinolone cream.

## 2019-10-24 NOTE — Assessment & Plan Note (Signed)
Advanced directive discussion - has at home, will bring [place] copy. HCPOA is wife [name].

## 2019-10-24 NOTE — Assessment & Plan Note (Signed)
Carries diagnosis of vascular dementia on namenda followed by neurology. ?parkinsonisms noted on exam today.

## 2019-10-24 NOTE — Patient Instructions (Addendum)
If interested, check with pharmacy about new 2 shot shingles series (shingrix).  Bring [place] copy of your living will to update your chart.  Good to see you today You are doing well today Return as needed or in 6 months for follow visit.   Health Maintenance After Age 74 After age 10, you are at a higher risk for certain long-term diseases and infections as well as injuries from falls. Falls are a major cause of broken bones and head injuries in people who are older than age 91. Getting regular preventive care can help to keep you healthy and well. Preventive care includes getting regular testing and making lifestyle changes as recommended by your health care provider. Talk with your health care provider about:  Which screenings and tests you should have. A screening is a test that checks for a disease when you have no symptoms.  A diet and exercise plan that is right for you. What should I know about screenings and tests to prevent falls? Screening and testing are the best ways to find a health problem early. Early diagnosis and treatment give you the best chance of managing medical conditions that are common after age 37. Certain conditions and lifestyle choices may make you more likely to have a fall. Your health care provider may recommend:  Regular vision checks. Poor vision and conditions such as cataracts can make you more likely to have a fall. If you wear glasses, make sure to get your prescription updated if your vision changes.  Medicine review. Work with your health care provider to regularly review all of the medicines you are taking, including over-the-counter medicines. Ask your health care provider about any side effects that may make you more likely to have a fall. Tell your health care provider if any medicines that you take make you feel dizzy or sleepy.  Osteoporosis screening. Osteoporosis is a condition that causes the bones to get weaker. This can make the bones weak and cause  them to break more easily.  Blood pressure screening. Blood pressure changes and medicines to control blood pressure can make you feel dizzy.  Strength and balance checks. Your health care provider may recommend certain tests to check your strength and balance while standing, walking, or changing positions.  Foot health exam. Foot pain and numbness, as well as not wearing proper footwear, can make you more likely to have a fall.  Depression screening. You may be more likely to have a fall if you have a fear of falling, feel emotionally low, or feel unable to do activities that you used to do.  Alcohol use screening. Using too much alcohol can affect your balance and may make you more likely to have a fall. What actions can I take to lower my risk of falls? General instructions  Talk with your health care provider about your risks for falling. Tell your health care provider if: ? You fall. Be sure to tell your health care provider about all falls, even ones that seem minor. ? You feel dizzy, sleepy, or off-balance.  Take over-the-counter and prescription medicines only as told by your health care provider. These include any supplements.  Eat a healthy diet and maintain a healthy weight. A healthy diet includes low-fat dairy products, low-fat (lean) meats, and fiber from whole grains, beans, and lots of fruits and vegetables. Home safety  Remove any tripping hazards, such as rugs, cords, and clutter.  Install safety equipment such as grab bars in bathrooms and safety  rails on stairs.  Keep rooms and walkways well-lit. Activity   Follow a regular exercise program to stay fit. This will help you maintain your balance. Ask your health care provider what types of exercise are appropriate for you.  If you need a cane or walker, use it as recommended by your health care provider.  Wear supportive shoes that have nonskid soles. Lifestyle  Do not drink alcohol if your health care provider  tells you not to drink.  If you drink alcohol, limit how much you have: ? 0-1 drink a day for women. ? 0-2 drinks a day for men.  Be aware of how much alcohol is in your drink. In the [place] one drink equals one typical bottle of beer (12 oz), one-half glass of wine (5 oz), or one shot of hard liquor (1 oz).  Do not use any products that contain nicotine or tobacco, such as cigarettes and e-cigarettes. If you need help quitting, ask your health care provider. Summary  Having a healthy lifestyle and getting preventive care can help to protect your health and wellness after age 59.  Screening and testing are the best way to find a health problem early and help you avoid having a fall. Early diagnosis and treatment give you the best chance for managing medical conditions that are more common for people who are older than age 47.  Falls are a major cause of broken bones and head injuries in people who are older than age 41. Take precautions to prevent a fall at home.  Work with your health care provider to learn what changes you can make to improve your health and wellness and to prevent falls. This information is not intended to replace advice given to you by your health care provider. Make sure you discuss any questions you have with your health care provider. Document Released: 09/05/2017 Document Revised: 02/13/2019 Document Reviewed: 09/05/2017 Elsevier Patient Education  2020 [employer].

## 2019-10-24 NOTE — Telephone Encounter (Signed)
Spoke with pt's wife scheduling lab visit on 11/13/19 at 12:15.

## 2019-10-24 NOTE — Assessment & Plan Note (Signed)
Preventative protocols reviewed and updated unless pt declined. Discussed healthy diet and lifestyle.  

## 2019-10-24 NOTE — Telephone Encounter (Signed)
Forgot to check blood work while he was here. plz schedule him for lab visit when he returns for next coumadin check. Labs ordered.

## 2019-10-24 NOTE — Assessment & Plan Note (Signed)
Stable period on scheduled tylenol 500mg  TID.

## 2019-10-24 NOTE — Progress Notes (Signed)
This visit was conducted in person.  BP 124/66 (BP Location: Right Arm, Patient Position: Sitting, Cuff Size: Large)   Pulse (!) 55   Temp 97.8 F (36.6 C) (Temporal)   Ht 5\' 3"  (1.6 m)   Wt 213 lb 9 oz (96.9 kg)   SpO2 99%   BMI 37.83 kg/m    CC: CPE/AMW Subjective:    Patient ID: [name]., male    DOB: 06-28-1946, 73 y.o.   MRN: [id number]  HPI: [name]. is a 73 y.o. male presenting on 10/24/2019 for Annual Exam (Prt 2.  Pt accompaniend by wife, [name], 97.9].)   Saw health advisor 01/2019 for medicare wellness visit. Note reviewed.   Not using walker today.   No exam data present    Clinical Support from 01/14/2019 in [place] at [facility] Total Score  0      Fall Risk  10/24/2019 01/14/2019 01/02/2018 05/10/2017 06/08/2016  Falls in the past year? 1 0 No No No  Number falls in past yr: 0 - - - -  Injury with Fall? 0 - - - -  Risk for fall due to : - - - - -  Risk for fall due to: Comment - - - - -  had fall at 1am this morning - woke up wanting a sandwich. Fortunately no injury. Firemen came out to pick him back up.   Lab Results  Component Value Date   INR 3.6 (A) 10/23/2019   INR 3.2 (A) 09/25/2019   INR 2.5 06/12/2019    Skin doing better since starting TCI cream PRN for xerosis.  Resting tremor, stiffness, drooling endorsed by wife.   Daughter helps at home. Personal sitter also helps but less since pandemic.   Preventative: COLONOSCOPY Date: [facility]. iFOB positive 12/2014. After discussion has decided to defer. No blood in stool or bowel changes recently.  Prostate cancer screening - discussed with pt and wife, decide to postpone. Lung cancer screening - not eligible Flu shot -yearly prevnar 12/2014. Pneumovax 2017 Tetanus shot - unsure shingrix - discussed  Advanced directive discussion - has at home, will bring [place] copy. HCPOA is wife [name]. Seat belt use discussed.  Sunscreen use discussed. No  changing moles on skin.  Non smoker  Alcohol - none Dentist yearly Eye exam yearly  Bowel - constipation managed with bowel regimen  (miralax and colace) Bladder - no incontinence - does not wear depens or pads  Patient is married with 4 children. Patient is right handed.  Patient has 8 th grade education. Lives with wife [name] Dementia.  2-3 cups of black coffee      Relevant past medical, surgical, family and social history reviewed and updated as indicated. Interim medical history since our last visit reviewed. Allergies and medications reviewed and updated. Outpatient Medications Prior to Visit  Medication Sig Dispense Refill  . acetaminophen (TYLENOL) 500 MG tablet Take 500 mg by mouth 3 (three) times daily.    Marland Kitchen amLODipine (NORVASC) 2.5 MG tablet Take 1 tablet by mouth once daily 90 tablet 1  . atorvastatin (LIPITOR) 40 MG tablet Take 1 tablet (40 mg total) by mouth daily. 90 tablet 1  . clotrimazole (LOTRIMIN) 1 % cream Apply 1 application topically 2 (two) times daily. 30 g 0  . cyanocobalamin 500 MCG tablet Take 500 mcg by mouth daily. Vitamin B12    . dextromethorphan (DELSYM) 30 MG/5ML liquid Take 15-60 mg by  mouth 2 (two) times daily as needed for cough.    . divalproex (DEPAKOTE SPRINKLE) 125 MG capsule Take 1 capsule (125 mg total) by mouth at bedtime. 30 capsule 3  . docusate sodium (COLACE) 100 MG capsule Take 100 mg by mouth daily as needed for mild constipation.    . finasteride (PROSCAR) 5 MG tablet Take 1 tablet by mouth once daily 90 tablet 1  . FOLIC ACID PO Take 3 tablets by mouth 2 (two) times daily.    Marland Kitchen gabapentin (NEURONTIN) 300 MG capsule TAKE 1 CAPSULE BY MOUTH THREE TIMES DAILY 270 capsule 0  . guaiFENesin (MUCINEX) 600 MG 12 hr tablet Take 600-1,200 mg by mouth 2 (two) times daily as needed for cough or to loosen phlegm.    . Hydroactive Dressings (TEGADERM HYDROCOLLOID) MISC Apply 1 each topically once a week. 5 each 1  . ipratropium-albuterol (DUONEB)  0.5-2.5 (3) MG/3ML SOLN Take 3 mLs by nebulization every 2 (two) hours as needed. 360 mL 1  . LORazepam (ATIVAN) 0.5 MG tablet Take 0.5-1 tablets (0.25-0.5 mg total) by mouth daily as needed for anxiety. 20 tablet 0  . memantine (NAMENDA) 10 MG tablet Take 1 tablet (10 mg total) by mouth 2 (two) times daily. 60 tablet 3  . metoprolol tartrate (LOPRESSOR) 25 MG tablet Take 1/2 (one-half) tablet by mouth twice daily 90 tablet 0  . polyethylene glycol powder (GLYCOLAX/MIRALAX) powder DISSOLVE 1 CAPFUL (17GM) INTO 4 TO 8 OUNCES OF FLUID AND TAKE BY MOUTH ONCE DAILY AS DIRECTED 255 g 0  . Pyridoxine HCl (VITAMIN B-6 PO) Take 1 tablet by mouth 2 (two) times daily.    Marland Kitchen triamcinolone cream (KENALOG) 0.1 % Apply 1 application topically 2 (two) times daily as needed (dry skin with rash). Apply to AA. 453.6 g 0  . warfarin (COUMADIN) 5 MG tablet TAKE AS DIRECTED BY  COUMADIN  CLINIC 90 tablet 1  . HYDROcodone-acetaminophen (NORCO) 10-325 MG per tablet Take 1 tablet by mouth every 6 (six) hours as needed for moderate pain.      No facility-administered medications prior to visit.     Per HPI unless specifically indicated in ROS section below Review of Systems  Constitutional: Negative for activity change, appetite change, chills, fatigue, fever and unexpected weight change.  HENT: Negative for hearing loss.   Eyes: Positive for visual disturbance.  Respiratory: Positive for cough (occasional). Negative for chest tightness, shortness of breath and wheezing.   Cardiovascular: Negative for chest pain, palpitations and leg swelling.  Gastrointestinal: Negative for abdominal distention, abdominal pain, blood in stool, constipation, diarrhea, nausea and vomiting.  Genitourinary: Negative for difficulty urinating and hematuria.  Musculoskeletal: Negative for arthralgias, myalgias and neck pain.  Skin: Negative for rash.  Neurological: Negative for dizziness, seizures, syncope and headaches.  Hematological:  Negative for adenopathy. Bruises/bleeds easily.  Psychiatric/Behavioral: Negative for dysphoric mood. The patient is not nervous/anxious.    Objective:    BP 124/66 (BP Location: Right Arm, Patient Position: Sitting, Cuff Size: Large)   Pulse (!) 55   Temp 97.8 F (36.6 C) (Temporal)   Ht 5\' 3"  (1.6 m)   Wt 213 lb 9 oz (96.9 kg)   SpO2 99%   BMI 37.83 kg/m   Wt Readings from Last 3 Encounters:  10/24/19 213 lb 9 oz (96.9 kg)  10/06/19 212 lb (96.2 kg)  08/20/19 219 lb 9 oz (99.6 kg)    Physical Exam Vitals and nursing note reviewed.  Constitutional:  General: He is not in acute distress.    Appearance: Normal appearance. He is well-developed. He is obese. He is not ill-appearing.  HENT:     Head: Normocephalic and atraumatic.     Right Ear: Hearing, tympanic membrane, ear canal and external ear normal.     Left Ear: Hearing, tympanic membrane, ear canal and external ear normal.     Nose: Nose normal.     Mouth/Throat:     Pharynx: Uvula midline.  Eyes:     General: No scleral icterus.    Extraocular Movements: Extraocular movements intact.     Conjunctiva/sclera: Conjunctivae normal.     Pupils: Pupils are equal, round, and reactive to light.  Cardiovascular:     Rate and Rhythm: Normal rate and regular rhythm.     Pulses: Normal pulses.          Radial pulses are 2+ on the right side and 2+ on the left side.     Heart sounds: Murmur (3/6 systolic) present.  Pulmonary:     Effort: Pulmonary effort is normal. No respiratory distress.     Breath sounds: Normal breath sounds. No wheezing, rhonchi or rales.  Abdominal:     General: Bowel sounds are normal. There is no distension.     Palpations: Abdomen is soft. There is no mass.     Tenderness: There is no abdominal tenderness. There is no guarding or rebound.  Musculoskeletal:        General: Normal range of motion.     Cervical back: Normal range of motion and neck supple.     Right lower leg: No edema.     Left  lower leg: No edema.  Lymphadenopathy:     Cervical: No cervical adenopathy.  Skin:    General: Skin is warm and dry.     Findings: No rash.  Neurological:     Mental Status: He is alert.     Comments: Slowed movements, slowed cognition, masked fascies, resting tremor present  Psychiatric:        Mood and Affect: Mood normal.       Results for orders placed or performed in visit on 10/23/19  POCT INR  Result Value Ref Range   INR 3.6 (A) 2.0 - 3.0   Lab Results  Component Value Date   HGBA1C 5.8 01/14/2019    Assessment & Plan:  This visit occurred during the SARS-CoV-2 public health emergency.  Safety protocols were in place, including screening questions prior to the visit, additional usage of staff PPE, and extensive cleaning of exam room while observing appropriate contact time as indicated for disinfecting solutions.   Problem List Items Addressed This Visit    Xerosis of skin    This has improved with regular moisturizing and PRN triamcinolone cream.       Vascular dementia (HCC)    Carries diagnosis of vascular dementia on namenda followed by neurology. ?parkinsonisms noted on exam today.       Severe obesity (BMI 35.0-39.9) with comorbidity (HCC)    Weight loss noted over the past year.       PULMONARY EMBOLISM, HX OF   Primary hypercoagulable state (HCC)    Continue coumadin.       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       HYPERLIPIDEMIA    Chronic, stable on  statin. Update FLP when returns fasting.  The ASCVD Risk score Denman George(Goff DC Jr., et al., 2013) failed to calculate for the following reasons:   The patient has a prior MI or stroke diagnosis       Relevant Orders   Lipid panel   Comprehensive metabolic panel   TSH   HTN (hypertension)    Chronic, stable. Continue current regimen.       Relevant Orders    Microalbumin / creatinine urine ratio   Health maintenance examination - Primary    Preventative protocols reviewed and updated unless pt declined. Discussed healthy diet and lifestyle.       Gout    Chronic, stable off medication. No recent gout flares.       Encounter for long-term (current) use of antiplatelets/antithrombotics    Recent supratherapeutic INR managed by coumadin clinic.       Drug-induced constipation with proper administration    Now off hydrocodone. Constipation managed with miralax, colace.       Diabetes mellitus type 2, controlled, with complications (HCC)    Update A1c when he returns. Currently diet controlled.       Relevant Orders   Hemoglobin A1c   COPD (chronic obstructive pulmonary disease) (HCC)    Managed with PRN duonebs.       Chronic back pain    Stable period on scheduled tylenol 500mg  TID.       B12 deficiency    Update levels on lower b12 dose.       Relevant Orders   Vitamin B12   Advanced care planning/counseling discussion    Advanced directive discussion - has at home, will bring us copy. HCPOA is wife [name].          No orders of the defined types were placed in this encounter.  Orders Placed This Encounter  Procedures  . Vitamin B12    Standing Status:   Future    Standing Expiration Date:   10/23/2020  . Lipid panel    Standing Status:   Future    Standing Expiration Date:   10/23/2020  . Comprehensive metabolic panel    Standing Status:   Future    Standing Expiration Date:   10/23/2020  . TSH    Standing Status:   Future    Standing Expiration Date:   10/23/2020  . CBC with Differential    Standing Status:   Future    Standing Expiration Date:   10/23/2020  . Microalbumin / creatinine urine ratio    Standing Status:   Future    Standing Expiration Date:   10/23/2020  . Hemoglobin A1c    Standing Status:   Future    Standing Expiration Date:   10/23/2020    Follow up plan: Return in about 6 months  (around 04/23/2020) for follow up visit.  Eustaquio BoydenJavier Ilyse Tremain, MD

## 2019-10-24 NOTE — Assessment & Plan Note (Signed)
Managed with PRN duonebs.

## 2019-10-24 NOTE — Assessment & Plan Note (Signed)
Recent supratherapeutic INR managed by coumadin clinic.

## 2019-10-24 NOTE — Assessment & Plan Note (Signed)
Now off hydrocodone. Constipation managed with miralax, colace.

## 2019-10-24 NOTE — Assessment & Plan Note (Signed)
Weight loss noted over the past year.  

## 2019-10-24 NOTE — Assessment & Plan Note (Signed)
Continue statin. 

## 2019-10-24 NOTE — Assessment & Plan Note (Signed)
Update levels on lower b12 dose.

## 2019-10-24 NOTE — Assessment & Plan Note (Signed)
Continue coumadin.  

## 2019-10-24 NOTE — Assessment & Plan Note (Signed)
Update A1c when he returns. Currently diet controlled.

## 2019-10-24 NOTE — Assessment & Plan Note (Addendum)
Chronic, stable on statin. Update FLP when returns fasting.  The ASCVD Risk score Mikey Bussing DC Jr., et al., 2013) failed to calculate for the following reasons:   The patient has a prior MI or stroke diagnosis

## 2019-11-13 ENCOUNTER — Telehealth: Payer: Self-pay | Admitting: Family Medicine

## 2019-11-13 ENCOUNTER — Other Ambulatory Visit: Payer: Self-pay

## 2019-11-13 ENCOUNTER — Other Ambulatory Visit: Payer: PPO

## 2019-11-13 ENCOUNTER — Ambulatory Visit (INDEPENDENT_AMBULATORY_CARE_PROVIDER_SITE_OTHER): Payer: PPO

## 2019-11-13 DIAGNOSIS — Z7902 Long term (current) use of antithrombotics/antiplatelets: Secondary | ICD-10-CM

## 2019-11-13 LAB — POCT INR: INR: 3.3 — AB (ref 2.0–3.0)

## 2019-11-13 NOTE — Telephone Encounter (Signed)
Pt dropped off disability parking placard form On cart to be delivered to dr g

## 2019-11-13 NOTE — Patient Instructions (Addendum)
Pre visit review using our clinic review tool, if applicable. No additional management support is needed unless otherwise documented below in the visit note.   Hold coumadin today (1/7) and then change dosage and take 1 tablet daily except 1/2 tablet on Monday Wed and Fridays.  Re-check in 3 weeks.  Dosing instructions given to patient' wife and she verbalized understanding.

## 2019-11-14 ENCOUNTER — Other Ambulatory Visit: Payer: Self-pay | Admitting: Family Medicine

## 2019-11-14 NOTE — Telephone Encounter (Signed)
Spouse aware paperwork is ready for pick up

## 2019-11-14 NOTE — Telephone Encounter (Signed)
Filled and in my outbox

## 2019-11-24 ENCOUNTER — Emergency Department (HOSPITAL_COMMUNITY): Payer: PPO

## 2019-11-24 ENCOUNTER — Telehealth: Payer: Self-pay

## 2019-11-24 ENCOUNTER — Other Ambulatory Visit: Payer: Self-pay

## 2019-11-24 ENCOUNTER — Emergency Department (HOSPITAL_COMMUNITY)
Admission: EM | Admit: 2019-11-24 | Discharge: 2019-11-25 | Disposition: A | Discharge status: Home / Self Care | Payer: PPO | Attending: Emergency Medicine | Admitting: Emergency Medicine

## 2019-11-24 DIAGNOSIS — S51011A Laceration without foreign body of right elbow, initial encounter: Secondary | ICD-10-CM | POA: Diagnosis not present

## 2019-11-24 DIAGNOSIS — Z86718 Personal history of other venous thrombosis and embolism: Secondary | ICD-10-CM | POA: Insufficient documentation

## 2019-11-24 DIAGNOSIS — Y929 Unspecified place or not applicable: Secondary | ICD-10-CM | POA: Diagnosis not present

## 2019-11-24 DIAGNOSIS — S0990XA Unspecified injury of head, initial encounter: Secondary | ICD-10-CM | POA: Diagnosis not present

## 2019-11-24 DIAGNOSIS — W010XXA Fall on same level from slipping, tripping and stumbling without subsequent striking against object, initial encounter: Secondary | ICD-10-CM | POA: Diagnosis not present

## 2019-11-24 DIAGNOSIS — Z7901 Long term (current) use of anticoagulants: Secondary | ICD-10-CM | POA: Diagnosis not present

## 2019-11-24 DIAGNOSIS — T148XXA Other injury of unspecified body region, initial encounter: Secondary | ICD-10-CM

## 2019-11-24 DIAGNOSIS — Y9389 Activity, other specified: Secondary | ICD-10-CM | POA: Insufficient documentation

## 2019-11-24 DIAGNOSIS — R519 Headache, unspecified: Secondary | ICD-10-CM | POA: Diagnosis not present

## 2019-11-24 DIAGNOSIS — S51811A Laceration without foreign body of right forearm, initial encounter: Secondary | ICD-10-CM | POA: Insufficient documentation

## 2019-11-24 DIAGNOSIS — R2689 Other abnormalities of gait and mobility: Secondary | ICD-10-CM | POA: Diagnosis not present

## 2019-11-24 DIAGNOSIS — Z7984 Long term (current) use of oral hypoglycemic drugs: Secondary | ICD-10-CM | POA: Diagnosis not present

## 2019-11-24 DIAGNOSIS — I10 Essential (primary) hypertension: Secondary | ICD-10-CM | POA: Insufficient documentation

## 2019-11-24 DIAGNOSIS — E119 Type 2 diabetes mellitus without complications: Secondary | ICD-10-CM | POA: Insufficient documentation

## 2019-11-24 DIAGNOSIS — Y999 Unspecified external cause status: Secondary | ICD-10-CM | POA: Insufficient documentation

## 2019-11-24 DIAGNOSIS — Z8673 Personal history of transient ischemic attack (TIA), and cerebral infarction without residual deficits: Secondary | ICD-10-CM | POA: Insufficient documentation

## 2019-11-24 DIAGNOSIS — S50311A Abrasion of right elbow, initial encounter: Secondary | ICD-10-CM | POA: Diagnosis not present

## 2019-11-24 DIAGNOSIS — W19XXXA Unspecified fall, initial encounter: Secondary | ICD-10-CM

## 2019-11-24 MED ORDER — BACITRACIN ZINC 500 UNIT/GM EX OINT
TOPICAL_OINTMENT | Freq: Two times a day (BID) | CUTANEOUS | Status: DC
  Filled 2019-11-24: qty 0.9

## 2019-11-24 MED ORDER — ACETAMINOPHEN 325 MG PO TABS
650.0000 mg | ORAL_TABLET | Freq: Once | ORAL | Status: AC
  Administered 2019-11-25: 650 mg via ORAL
  Filled 2019-11-24: qty 2

## 2019-11-24 NOTE — Telephone Encounter (Signed)
[facility] Day - Client TELEPHONE ADVICE RECORD [facility] Patient Name: Logan Valdez Gender: Male DOB: 08-Jul-1946 Age: 74 Y 2 M 23 D Return Phone Number: [phone] (Primary), [phone] (Secondary) Address: [place] [address] Client [facility] Day - Client Client Site [facility] - Day Physician AA - PHYSICIAN, Crissie Figures- MD Contact Type Call Who Is Calling Patient / Member / Family / Caregiver Call Type Triage / Clinical Caller Name Armen Waring Relationship To Patient Spouse Return Phone Number [phone] (Primary) Chief Complaint Headache Reason for Call Symptomatic / Request for Health Information Initial Comment Caller states that pt is on blood thinners and had a fall yesterday and hit his head and had headache last night but went away. Translation No Nurse Assessment Nurse: Fransisco Hertz, RN, Elnita Maxwell Date/Time Lamount Cohen Time): 11/24/2019 12:35:40 PM Confirm and document reason for call. If symptomatic, describe symptoms. ---Caller states that her husband is on blood thinners and had a fall yesterday and hit his head and had headache last night but had before he fell but went away. Has the patient had close contact with a person known or suspected to have the novel coronavirus illness OR traveled / lives in area with major community spread (including international travel) in the last 14 days from the onset of symptoms? * If Asymptomatic, screen for exposure and travel within the last 14 days. ---No Does the patient have any new or worsening symptoms? ---Yes Will a triage be completed? ---Yes Related visit to physician within the last 2 weeks? ---Yes Does the PT have any chronic conditions? (i.e. diabetes, asthma, this includes High risk factors for pregnancy, etc.) ---Yes List chronic conditions. ---dementia, CVA 10 years ago Is this a behavioral health or substance abuse  call? ---No Guidelines Guideline Title Affirmed Question Affirmed Notes Nurse Date/Time (Eastern Time) Head Injury Taking Coumadin (warfarin) or other strong blood thinner, or known bleeding disorder (e.g., thrombocytopenia) Fransisco Hertz, RN, Elnita Maxwell 11/24/2019 12:38:30 PM PLEASE NOTE: All timestamps contained within this report are represented as [place] Standard Time. CONFIDENTIALTY NOTICE: This fax transmission is intended only for the addressee. It contains information that is legally privileged, confidential or otherwise protected from use or disclosure. If you are not the intended recipient, you are strictly prohibited from reviewing, disclosing, copying using or disseminating any of this information or taking any action in reliance on or regarding this information. If you have received this fax in error, please notify us immediately by telephone so that we can arrange for its return to [place]. Phone: [phone], Toll-Free: [phone], Fax: [phone] Page: 2 of 2 Call Id: [id number] Disp. Time Lamount Cohen Time) Disposition Final User 11/24/2019 12:52:53 PM Go to ED Now (or PCP triage) Yes Fransisco Hertz, RN, Elizabeth Sauer Disagree/Comply Disagree Caller Understands Yes PreDisposition Call Doctor Care Advice Given Per Guideline GO TO ED NOW (OR PCP TRIAGE): CARE ADVICE given per Head Injury (Adult) guideline. Comments User: Caryn Bee, RN Date/Time Lamount Cohen Time): 11/24/2019 12:39:43 PM Parkinson's User: Caryn Bee, RN Date/Time Lamount Cohen Time): 11/24/2019 12:58:28 PM Attempted to call office per her request but no answer due to [facility] holiday Referrals GO TO FACILITY UNDECIDED

## 2019-11-24 NOTE — ED Notes (Signed)
Clean and flushed wound on right arm. Applied bacitracin and wrapped with sterile gauze and coban

## 2019-11-24 NOTE — ED Triage Notes (Signed)
Pt here after fall last night. His daughter was helping him when he lost his balance and fell forward, hitting his head and R elbow. Pt on Warfarin. Skin tear to right elbow, bandage applied by family. No LOC. Hx dementia. No change in mentation per family. Declining mobility over recent months.

## 2019-11-24 NOTE — Telephone Encounter (Signed)
Mrs [facility] signed) said  last night pt could not pick his rt foot up and fell and hit head on door frame. Pt did not lose consciousness but pt has bruise on elbow. Today pt continues to have trouble with walking and using rt leg. Mrs Debes will take pt to [facility] ED (911 declined). FYI to Dr.Gutierrez.

## 2019-11-24 NOTE — Telephone Encounter (Signed)
Will await ER eval

## 2019-11-24 NOTE — ED Provider Notes (Signed)
[name] EMERGENCY DEPARTMENT Provider Note   CSN: [id number] Arrival date & time: 11/24/19  1546     History Chief Complaint  Patient presents with  . Fall    Logan Valdez. is a 74 y.o. male.  HPI     74 year old comes in a chief complaint of fall.  Patient has history of DVT, stroke, diabetes and is on Coumadin.  According to the wife patient had a fall yesterday.  He has history of stroke and has poor balance.  She could not hold him and he had a fall onto carpet.  Patient started bleeding from his forearm.  He has had history of supratherapeutic INR, and they called the PCP today, and were advised to come to the ER.  Patient is complaining of headache.  Wife wants to check the INR as it has been supratherapeutic in the past.  Past Medical History:  Diagnosis Date  . Chronic lower back pain    [place]  . DDD (degenerative disc disease), lumbar   . Dysmetabolic syndrome X   . Gout, unspecified   . History of DVT (deep vein thrombosis)   . History of pulmonary embolism   . History of stroke 2009   [place]  . History of thrombophlebitis   . HTN (hypertension)   . Memory loss   . Mixed hyperlipidemia   . Obesity   . PFO (patent foramen ovale)   . Primary hypercoagulable state ([place]) 05/14/2008   Personal h/o DVT, PE, PFO, and lupus AC +   . Stage I pressure ulcer of left buttock 04/25/2019  . Stroke ([place])   . Type II or unspecified type diabetes mellitus without mention of complication, uncontrolled   . Unspecified sleep apnea     Patient Active Problem List   Diagnosis Date Noted  . Xerosis of skin 10/06/2019  . Balanitis 09/19/2018  . Scrotal swelling 09/19/2018  . Daytime somnolence 09/19/2018  . Agitation 05/31/2018  . Nocturia 05/01/2018  . Health maintenance examination 01/07/2018  . Long term (current) use of anticoagulants 11/15/2017  . Encounter for long-term (current) use of antiplatelets/antithrombotics 10/18/2017  . COPD  (chronic obstructive pulmonary disease) ([place]) 03/05/2017  . Poor urinary stream 05/06/2016  . Urge incontinence 05/06/2016  . History of stroke 01/12/2016  . Chronic back pain 09/07/2015  . Leg cramps 06/22/2015  . Drug-induced constipation with proper administration 04/12/2015  . 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 Heme positive stool 01/13/2015  . Medicare annual wellness visit, subsequent 12/22/2014  . Advanced care planning/counseling discussion 12/22/2014  . B12 deficiency 12/13/2014  . Vascular dementia ([place]) 12/09/2014  . Encounter for therapeutic drug monitoring 12/17/2013  . 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 HTN (hypertension) 09/18/2012  . PFO (patent foramen ovale) 04/08/2009  . OSA (obstructive sleep apnea) 04/08/2009  . COMPRESSION FRACTURE, L4 VERTEBRA 11/04/2008  . Diabetes mellitus type 2, controlled, with complications ([place]) 15/40/0867  . Primary hypercoagulable state ([place]) 05/14/2008  . Lumbar radiculopathy 05/14/2008  . Gout 12/09/2007  . METABOLIC SYNDROME X 61/95/0932  . Severe obesity (BMI 35.0-39.9) with comorbidity ([place]) 12/09/2007  . PULMONARY EMBOLISM, HX OF 12/09/2007  . DEEP VENOUS THROMBOPHLEBITIS, HX OF 12/09/2007  . HYPERLIPIDEMIA 12/05/2007    Past Surgical History:  Procedure Laterality Date  . COLONOSCOPY  2000   [facility]  . WRIST SURGERY     Right wrist repair post trauma       Family  History  Problem Relation Age of Onset  . Emphysema Father   . Diabetes Brother   . Hypertension Brother   . Heart attack Brother 43  . Skin cancer Brother   . Lung cancer Brother   . Cancer Maternal Aunt        ? Type   . Stroke Neg Hx     Social History   Tobacco Use  . Smoking status: Never Smoker  . Smokeless tobacco: Never Used  Substance Use Topics  . Alcohol use: No    Alcohol/week: 0.0 standard drinks  . Drug use: No    Home Medications Prior to Admission  medications   Medication Sig Start Date End Date Taking? Authorizing Provider  acetaminophen (TYLENOL) 500 MG tablet Take 500 mg by mouth 3 (three) times daily.    [provider]  amLODipine (NORVASC) 2.5 MG tablet Take 1 tablet by mouth once daily 07/04/19   Eustaquio Boyden, MD  atorvastatin (LIPITOR) 40 MG tablet Take 1 tablet (40 mg total) by mouth daily. 06/19/19   Eustaquio Boyden, MD  bacitracin ointment Apply 1 application topically 2 (two) times daily. 11/25/19   Derwood Kaplan, MD  clotrimazole (LOTRIMIN) 1 % cream Apply 1 application topically 2 (two) times daily. 09/19/18   Eustaquio Boyden, MD  cyanocobalamin 500 MCG tablet Take 500 mcg by mouth daily. Vitamin B12    [provider]  dextromethorphan (DELSYM) 30 MG/5ML liquid Take 15-60 mg by mouth 2 (two) times daily as needed for cough.    [provider]  divalproex (DEPAKOTE SPRINKLE) 125 MG capsule Take 1 capsule (125 mg total) by mouth at bedtime. 07/03/19   Ihor Austin, NP  docusate sodium (COLACE) 100 MG capsule Take 100 mg by mouth daily as needed for mild constipation.    [provider]  finasteride (PROSCAR) 5 MG tablet Take 1 tablet by mouth once daily 07/04/19   Eustaquio Boyden, MD  FOLIC ACID PO Take 3 tablets by mouth 2 (two) times daily.    [provider]  gabapentin (NEURONTIN) 300 MG capsule TAKE 1 CAPSULE BY MOUTH THREE TIMES DAILY 07/04/19   Tower, Audrie Gallus, MD  guaiFENesin (MUCINEX) 600 MG 12 hr tablet Take 600-1,200 mg by mouth 2 (two) times daily as needed for cough or to loosen phlegm.    [provider]  Hydroactive Dressings (TEGADERM HYDROCOLLOID) MISC Apply 1 each topically once a week. 06/30/19   Eustaquio Boyden, MD  ipratropium-albuterol (DUONEB) 0.5-2.5 (3) MG/3ML SOLN Take 3 mLs by nebulization every 2 (two) hours as needed. 02/19/17   Dorothea Ogle, MD  LORazepam (ATIVAN) 0.5 MG tablet Take 0.5-1 tablets (0.25-0.5 mg total) by mouth daily as  needed for anxiety. 09/19/18   Eustaquio Boyden, MD  memantine (NAMENDA) 10 MG tablet Take 1 tablet (10 mg total) by mouth 2 (two) times daily. 07/03/19   Ihor Austin, NP  metoprolol tartrate (LOPRESSOR) 25 MG tablet Take 1/2 (one-half) tablet by mouth twice daily 11/14/19   Eustaquio Boyden, MD  polyethylene glycol powder (GLYCOLAX/MIRALAX) powder DISSOLVE 1 CAPFUL (17GM) INTO 4 TO 8 OUNCES OF FLUID AND TAKE BY MOUTH ONCE DAILY AS DIRECTED 05/01/18   Eustaquio Boyden, MD  Pyridoxine HCl (VITAMIN B-6 PO) Take 1 tablet by mouth 2 (two) times daily.    [provider]  triamcinolone cream (KENALOG) 0.1 % Apply 1 application topically 2 (two) times daily as needed (dry skin with rash). Apply to AA. 10/06/19   Eustaquio Boyden, MD  warfarin (COUMADIN) 5 MG tablet TAKE AS DIRECTED BY  COUMADIN  CLINIC 07/04/19   Eustaquio Boyden, MD  metoprolol succinate (TOPROL-XL) 100 MG 24 hr tablet 1/2 by mouth daily 12/12/12 02/12/13  Pecola Lawless, MD    Allergies    Hydrochlorothiazide, Indomethacin, Pravastatin sodium, Exelon [rivastigmine tartrate], Lisinopril, and Micardis [telmisartan]  Review of Systems   Review of Systems  Constitutional: Positive for activity change.  Cardiovascular: Negative for chest pain.  Gastrointestinal: Negative for nausea and vomiting.  Skin: Positive for rash and wound.  Allergic/Immunologic: Negative for immunocompromised state.  Neurological: Positive for headaches.  Hematological: Bruises/bleeds easily.    Physical Exam Updated Vital Signs BP 115/61 (BP Location: Left Arm)   Pulse (!) 50   Temp 98.6 F (37 C) (Oral)   Resp 16   SpO2 97%   Physical Exam Vitals and nursing note reviewed.  Constitutional:      Appearance: He is well-developed.  HENT:     Head: Normocephalic and atraumatic.  Eyes:     Conjunctiva/sclera: Conjunctivae normal.     Pupils: Pupils are equal, round, and reactive to light.  Cardiovascular:     Rate and Rhythm: Normal  rate and regular rhythm.     Heart sounds: Normal heart sounds.  Pulmonary:     Effort: Pulmonary effort is normal.     Breath sounds: Normal breath sounds.  Abdominal:     General: Bowel sounds are normal.     Palpations: Abdomen is soft.     Tenderness: There is no abdominal tenderness.  Musculoskeletal:        General: No tenderness or deformity.     Cervical back: Normal range of motion and neck supple.  Skin:    General: Skin is warm.     Findings: Bruising present.     Comments: Skin tear over the right forearm  Neurological:     Mental Status: He is alert.     ED Results / Procedures / Treatments   Labs (all labs ordered are listed, but only abnormal results are displayed) Labs Reviewed  PROTIME-INR - Abnormal; Notable for the following components:      Result Value   Prothrombin Time 31.7 (*)    INR 3.1 (*)    All other components within normal limits  CBC WITH DIFFERENTIAL/PLATELET - Abnormal; Notable for the following components:   Platelets 126 (*)    All other components within normal limits    EKG None  Radiology CT Head Wo Contrast  Result Date: 11/24/2019 CLINICAL DATA:  Larey Seat last night and hit the back of his head. EXAM: CT HEAD WITHOUT CONTRAST TECHNIQUE: Contiguous axial images were obtained from the base of the skull through the vertex without intravenous contrast. COMPARISON:  02/15/2017. FINDINGS: Brain: Stable moderately enlarged ventricles and subarachnoid spaces. Stable mild patchy white matter low density in both cerebral hemispheres. Stable old left occipital lobe infarct. No intracranial hemorrhage, mass lesion or CT evidence of acute infarction. Vascular: No hyperdense vessel or unexpected calcification. Skull: Normal. Negative for fracture or focal lesion. Sinuses/Orbits: Unremarkable. Other: None. IMPRESSION: 1. No acute abnormality. 2. Stable atrophy, chronic small vessel white matter ischemic changes and old left occipital lobe infarct.  Electronically Signed   By: Beckie Salts M.D.   On: 11/24/2019 17:11    Procedures Procedures (including critical care time)  Medications Ordered in ED Medications  bacitracin ointment ( Topical Given 11/24/19 2353)  acetaminophen (TYLENOL) tablet 650 mg (has no administration in time  range)    ED Course  I have reviewed the triage vital signs and the nursing notes.  Pertinent labs & imaging results that were available during my care of the patient were reviewed by me and considered in my medical decision making (see chart for details).    MDM Rules/Calculators/A&P                      Patient comes into the ER with chief complaint of fall.  DDx includes: - Mechanical falls - ICH - Fractures - Contusions - Soft tissue injury  CT scan of the brain ordered to rule out ICH as he is having headache and is on Coumadin. CT scan is not showing any acute findings.  He also is noted to have skin tear over his right forearm.  Appropriate dressing will be applied.  Final Clinical Impression(s) / ED Diagnoses Final diagnoses:  Fall, initial encounter  Abrasion  Skin tear of right elbow without complication, initial encounter    Rx / DC Orders ED Discharge Orders         Ordered    bacitracin ointment  2 times daily     11/25/19 0003           Derwood Kaplan, MD 11/25/19 0004

## 2019-11-25 LAB — CBC WITH DIFFERENTIAL/PLATELET
Abs Immature Granulocytes: 0.02 10*3/uL (ref 0.00–0.07)
Basophils Absolute: 0 10*3/uL (ref 0.0–0.1)
Basophils Relative: 0 %
Eosinophils Absolute: 0.1 10*3/uL (ref 0.0–0.5)
Eosinophils Relative: 2 %
HCT: 42.3 % (ref 39.0–52.0)
Hemoglobin: 13.5 g/dL (ref 13.0–17.0)
Immature Granulocytes: 0 %
Lymphocytes Relative: 24 %
Lymphs Abs: 1.5 10*3/uL (ref 0.7–4.0)
MCH: 30.8 pg (ref 26.0–34.0)
MCHC: 31.9 g/dL (ref 30.0–36.0)
MCV: 96.4 fL (ref 80.0–100.0)
Monocytes Absolute: 0.6 10*3/uL (ref 0.1–1.0)
Monocytes Relative: 10 %
Neutro Abs: 4.1 10*3/uL (ref 1.7–7.7)
Neutrophils Relative %: 64 %
Platelets: 126 10*3/uL — ABNORMAL LOW (ref 150–400)
RBC: 4.39 MIL/uL (ref 4.22–5.81)
RDW: 13.2 % (ref 11.5–15.5)
WBC: 6.4 10*3/uL (ref 4.0–10.5)
nRBC: 0 % (ref 0.0–0.2)

## 2019-11-25 LAB — PROTIME-INR
INR: 3.1 — ABNORMAL HIGH (ref 0.8–1.2)
Prothrombin Time: 31.7 seconds — ABNORMAL HIGH (ref 11.4–15.2)

## 2019-11-25 MED ORDER — BACITRACIN ZINC 500 UNIT/GM EX OINT: 1.0000 "application " | TOPICAL_OINTMENT | Freq: Two times a day (BID) | CUTANEOUS | 0 refills | Status: DC

## 2019-11-25 NOTE — ED Notes (Signed)
Wife verbalized understanding of d/c instructions, follow up care, prescription, and s/s requiring return to ED. No additional questions at this time.

## 2019-11-25 NOTE — Discharge Instructions (Addendum)
We saw you in the ER for fall and resultant WOUND. Please read the instructions provided on wound care. Keep the area clean and dry, apply bacitracin ointment daily and take the medications provided. RETURN TO THE ER IF THERE IS INCREASED PAIN, REDNESS, PUS COMING OUT from the wound site.  The CT scan of your brain did not reveal any brain bleed. Your INR is 3.1.  It is best that you skip a dose of Coumadin and call your PCP to optimize your INR.

## 2019-11-26 ENCOUNTER — Telehealth: Payer: Self-pay | Admitting: Family Medicine

## 2019-11-26 NOTE — Telephone Encounter (Signed)
It would be optimal to get INR sooner -any chance he could have one tomorrow?  Let us know if any complications with the wound  Will cc to pcp

## 2019-11-26 NOTE — Telephone Encounter (Signed)
Patient's wife called She stated that Mr Logan Valdez was in the ER recently. She said she is having a hard time with the patient trying to get him to take the medications he was prescribed. Patient's wife requested a call back to give more information. Advised Dr Sharen Hones is out of office until Friday and wife stated she is not sure what she should do

## 2019-11-26 NOTE — Telephone Encounter (Signed)
Spoke with pt's wife, [name] (on dpr), to get details on pt.  States pt was seen at ER on 11/24/19 due to a fall at home.  Says he has a large gash on right arm.  She is mainly concerned b/c pt is on Coumadin and missed his dose on 1/18 and 1/19.  He finally took a dose today.  [name] is concerned about his INR.  States pt has appt for Coumadin Clinic on 12/04/19 at 12:00.  She asks if pt can wait until then or does he need to be checked before then.  I offered to schedule ER f/u but states pt seems irritable right now so she wants to find out about Coumadin first. Pls advise in Dr. Timoteo Expose absence.

## 2019-11-26 NOTE — Telephone Encounter (Signed)
Contacted pt's wife, [name], and she reports she thinks pt missed 2 doses and she would like him to come in this week for INR clinic instead of next week. Changed apt from 1/28 to 1/21. She reports pt has not been taking his regular medicine as directed since the ER visit either. Changed apt date and advised to contact office if anything is needed. [name] verbalized understanding.

## 2019-11-26 NOTE — Telephone Encounter (Signed)
Noted  

## 2019-11-27 ENCOUNTER — Other Ambulatory Visit: Payer: Self-pay

## 2019-11-27 ENCOUNTER — Ambulatory Visit (INDEPENDENT_AMBULATORY_CARE_PROVIDER_SITE_OTHER): Payer: PPO

## 2019-11-27 DIAGNOSIS — Z7901 Long term (current) use of anticoagulants: Secondary | ICD-10-CM

## 2019-11-27 DIAGNOSIS — Z7902 Long term (current) use of antithrombotics/antiplatelets: Secondary | ICD-10-CM

## 2019-11-27 LAB — POCT INR: INR: 4.5 — AB (ref 2.0–3.0)

## 2019-11-27 NOTE — Telephone Encounter (Signed)
Noted; agree.  Thank you!

## 2019-11-27 NOTE — Progress Notes (Signed)
Looking into home monitoring for the pt. Awaiting PCP signature.

## 2019-11-27 NOTE — Patient Instructions (Addendum)
Pre visit review using our clinic review tool, if applicable. No additional management support is needed unless otherwise documented below in the visit note.  Hold coumadin today and tomorrow. Change dosing to 2.5mg  M, W, F, Sat and 5mg  Sun, Tues, Thursday. Recheck in 2 wks

## 2019-11-28 ENCOUNTER — Telehealth: Payer: Self-pay

## 2019-11-28 NOTE — Telephone Encounter (Signed)
When pt was in office for coumadin clinic his wife reported she may need help with pt and is wondering if pt would qualify for home health. Advised pt may need apt for this evaluation. Pt's wife, [name], verbalized understanding.

## 2019-11-29 NOTE — Telephone Encounter (Signed)
Noted. Unfortunately, I saw pt more than 30 days ago - will need OV for face to face HH eval. plz schedule at their convenience.

## 2019-12-01 NOTE — Telephone Encounter (Signed)
Spoke with pt's wife, [name] (on dpr), relaying Dr. Timoteo Expose message.  Schedule OV on 12/03/19 at 1:00.

## 2019-12-03 ENCOUNTER — Ambulatory Visit: Payer: PPO | Admitting: Family Medicine

## 2019-12-04 ENCOUNTER — Ambulatory Visit: Payer: PPO

## 2019-12-06 ENCOUNTER — Other Ambulatory Visit: Payer: Self-pay | Admitting: Adult Health

## 2019-12-06 ENCOUNTER — Other Ambulatory Visit: Payer: Self-pay | Admitting: Family Medicine

## 2019-12-08 NOTE — Telephone Encounter (Signed)
Pt missed apt on 1/27 for discussion about HH. Contacted pt's wife, [name], to see if the home monitoring INR company contacted them. She said right now she does not want home INR monitoring or the [facility] help due to COVID. Advised if she changed her mind to contact the office and ask for this nurse. Pt's wife verbalized understanding.

## 2019-12-08 NOTE — Telephone Encounter (Signed)
Last refilled 07/04/19 for #270 with 0 refills. Patient was last seen on 10/24/19 for a CPE. Ok to refill?

## 2019-12-10 NOTE — Telephone Encounter (Addendum)
ERx I would like to see if he would do just as well with gabapentin 300mg  twice daily.  Would see if wife would try lower frequency during day and monitor effect on back pain.  Let know how he does.

## 2019-12-11 ENCOUNTER — Other Ambulatory Visit: Payer: Self-pay

## 2019-12-11 ENCOUNTER — Telehealth: Payer: Self-pay

## 2019-12-11 ENCOUNTER — Ambulatory Visit (INDEPENDENT_AMBULATORY_CARE_PROVIDER_SITE_OTHER): Payer: PPO

## 2019-12-11 DIAGNOSIS — Z7901 Long term (current) use of anticoagulants: Secondary | ICD-10-CM

## 2019-12-11 LAB — POCT INR: INR: 1.8 — AB (ref 2.0–3.0)

## 2019-12-11 NOTE — Patient Instructions (Addendum)
Pre visit review using our clinic review tool, if applicable. No additional management support is needed unless otherwise documented below in the visit note.  Increase dose today to 7.5mg  then continue 2.5mg  M, W, F, Sat and 5mg  Sun, Tues, Thursday. Recheck in 3 wks

## 2019-12-11 NOTE — Telephone Encounter (Signed)
Spoke with pt's wife, [name] (on dpr), relaying Dr. Timoteo Expose message.  She states pt has been taking med BID and it works well for him.  FYI to Dr. Reece Agar.

## 2019-12-11 NOTE — Telephone Encounter (Signed)
Pt in clinic today for INR and coumadin management. At last visit an order was faxed to [employer]. Pt's wife reports she has not heard anything from them yet.  Contacted [employer] and spoke with Greggory Stallion. He reports they are currently working on a PA and will contact the pt after it is approved.

## 2019-12-11 NOTE — Telephone Encounter (Signed)
Contacted pt's wife and advised. Advised this nurse would check again in a couple of weeks. [name] appreciative.

## 2019-12-12 ENCOUNTER — Telehealth: Payer: Self-pay

## 2019-12-12 MED ORDER — GABAPENTIN 300 MG PO CAPS: 300.0000 mg | ORAL_CAPSULE | Freq: Two times a day (BID) | ORAL | 1 refills | Status: DC

## 2019-12-12 NOTE — Telephone Encounter (Signed)
Spoke with pt's wife, [name] (on dpr), asking about refill requests.  States she called our office by accident and confirms neuro prescribes med.  Disregard message.

## 2019-12-12 NOTE — Addendum Note (Signed)
Addended by: Eustaquio Boyden on: 12/12/2019 09:19 AM   Modules accepted: Orders

## 2019-12-12 NOTE — Telephone Encounter (Signed)
Med list updated

## 2019-12-12 NOTE — Telephone Encounter (Signed)
Received message from Ms Ransome that patient needed refill, the phoen was breaking up but sounded like she said Namenda. Looking in the chart this medication was filled last by patient's neurologist. Left detailed message asking to call back to confirm the medication and if it was this one per our record neurologist filled it last time.

## 2019-12-14 ENCOUNTER — Ambulatory Visit: Payer: PPO | Attending: Internal Medicine

## 2019-12-14 DIAGNOSIS — Z23 Encounter for immunization: Secondary | ICD-10-CM | POA: Insufficient documentation

## 2019-12-14 NOTE — Progress Notes (Signed)
   [id number]  Name:  Aadith Raudenbush.    MRN: [id number] DOB: 1946/01/28  12/14/2019  Mr. Lorenson was observed post Covid-19 immunization for 15 minutes without incidence. He was provided with Vaccine Information Sheet and instruction to access the V-Safe system.   Mr. Cappelletti was instructed to call 911 with any severe reactions post vaccine: Marland Kitchen Difficulty breathing  . Swelling of your face and throat  . A fast heartbeat  . A bad rash all over your body  . Dizziness and weakness    Immunizations Administered    Name Date Dose VIS Date Route   Pfizer COVID-19 Vaccine 12/14/2019 10:20 AM 0.3 mL 10/17/2019 Intramuscular   Manufacturer: [employer], [employer]   Lot: [id number]   NDC: [address]-6

## 2019-12-15 ENCOUNTER — Telehealth: Payer: Self-pay | Admitting: Adult Health

## 2019-12-15 MED ORDER — MEMANTINE HCL 10 MG PO TABS: 10.0000 mg | ORAL_TABLET | Freq: Two times a day (BID) | ORAL | 3 refills | Status: DC

## 2019-12-15 NOTE — Telephone Encounter (Signed)
Refill has been sent to the patient's pharmacy.  

## 2019-12-15 NOTE — Telephone Encounter (Signed)
Pt is requesting a refill of memantine (NAMENDA) 10 MG tablet, to be sent to     [facility] [address] [place] - [address]

## 2019-12-17 NOTE — Telephone Encounter (Signed)
Contacted Mark, [employer] rep, for home testing monitor. He reports all has passed with insurance. The unit will be mailed out this week and then they will set up the education.

## 2019-12-24 DIAGNOSIS — Z7901 Long term (current) use of anticoagulants: Secondary | ICD-10-CM | POA: Diagnosis not present

## 2019-12-24 DIAGNOSIS — Z86718 Personal history of other venous thrombosis and embolism: Secondary | ICD-10-CM | POA: Diagnosis not present

## 2019-12-24 LAB — POCT INR
INR: 3 (ref 2.0–3.0)
INR: 3 (ref 2.0–3.0)

## 2019-12-26 ENCOUNTER — Ambulatory Visit (INDEPENDENT_AMBULATORY_CARE_PROVIDER_SITE_OTHER): Payer: PPO

## 2019-12-26 DIAGNOSIS — Z7901 Long term (current) use of anticoagulants: Secondary | ICD-10-CM

## 2019-12-26 NOTE — Patient Instructions (Signed)
Pre visit review using our clinic review tool, if applicable. No additional management support is needed unless otherwise documented below in the visit note. 

## 2019-12-26 NOTE — Progress Notes (Signed)
Received fax from [employer] with test result of 3.0. Office was closed yesterday due to weather and that is why INR was not addressed until now.

## 2019-12-30 ENCOUNTER — Ambulatory Visit: Payer: PPO

## 2020-01-04 DIAGNOSIS — Z7901 Long term (current) use of anticoagulants: Secondary | ICD-10-CM | POA: Diagnosis not present

## 2020-01-04 DIAGNOSIS — Z86718 Personal history of other venous thrombosis and embolism: Secondary | ICD-10-CM | POA: Diagnosis not present

## 2020-01-04 LAB — POCT INR: INR: 3.5 — AB (ref 2.0–3.0)

## 2020-01-05 ENCOUNTER — Other Ambulatory Visit: Payer: Self-pay

## 2020-01-05 ENCOUNTER — Ambulatory Visit (INDEPENDENT_AMBULATORY_CARE_PROVIDER_SITE_OTHER): Payer: PPO

## 2020-01-05 ENCOUNTER — Emergency Department (HOSPITAL_COMMUNITY): Payer: PPO

## 2020-01-05 ENCOUNTER — Inpatient Hospital Stay (HOSPITAL_COMMUNITY)
Admission: EM | Admit: 2020-01-05 | Discharge: 2020-01-09 | DRG: 097 | Disposition: A | Discharge status: Hospice | Payer: PPO | Attending: Internal Medicine | Admitting: Internal Medicine

## 2020-01-05 ENCOUNTER — Telehealth: Payer: Self-pay | Admitting: Adult Health

## 2020-01-05 DIAGNOSIS — R791 Abnormal coagulation profile: Secondary | ICD-10-CM | POA: Diagnosis present

## 2020-01-05 DIAGNOSIS — R531 Weakness: Secondary | ICD-10-CM | POA: Diagnosis not present

## 2020-01-05 DIAGNOSIS — Z8672 Personal history of thrombophlebitis: Secondary | ICD-10-CM | POA: Diagnosis not present

## 2020-01-05 DIAGNOSIS — G20 Parkinson's disease: Secondary | ICD-10-CM | POA: Diagnosis present

## 2020-01-05 DIAGNOSIS — E669 Obesity, unspecified: Secondary | ICD-10-CM | POA: Diagnosis not present

## 2020-01-05 DIAGNOSIS — L89159 Pressure ulcer of sacral region, unspecified stage: Secondary | ICD-10-CM | POA: Diagnosis present

## 2020-01-05 DIAGNOSIS — F05 Delirium due to known physiological condition: Secondary | ICD-10-CM | POA: Diagnosis present

## 2020-01-05 DIAGNOSIS — R410 Disorientation, unspecified: Secondary | ICD-10-CM | POA: Diagnosis not present

## 2020-01-05 DIAGNOSIS — F0150 Vascular dementia without behavioral disturbance: Secondary | ICD-10-CM | POA: Diagnosis not present

## 2020-01-05 DIAGNOSIS — R296 Repeated falls: Secondary | ICD-10-CM | POA: Diagnosis present

## 2020-01-05 DIAGNOSIS — D6859 Other primary thrombophilia: Secondary | ICD-10-CM | POA: Diagnosis present

## 2020-01-05 DIAGNOSIS — F0280 Dementia in other diseases classified elsewhere without behavioral disturbance: Secondary | ICD-10-CM | POA: Diagnosis present

## 2020-01-05 DIAGNOSIS — Z20822 Contact with and (suspected) exposure to covid-19: Secondary | ICD-10-CM | POA: Diagnosis not present

## 2020-01-05 DIAGNOSIS — Z833 Family history of diabetes mellitus: Secondary | ICD-10-CM

## 2020-01-05 DIAGNOSIS — R0902 Hypoxemia: Secondary | ICD-10-CM | POA: Diagnosis not present

## 2020-01-05 DIAGNOSIS — R1310 Dysphagia, unspecified: Secondary | ICD-10-CM | POA: Diagnosis present

## 2020-01-05 DIAGNOSIS — G8929 Other chronic pain: Secondary | ICD-10-CM | POA: Diagnosis present

## 2020-01-05 DIAGNOSIS — G4730 Sleep apnea, unspecified: Secondary | ICD-10-CM | POA: Diagnosis present

## 2020-01-05 DIAGNOSIS — Z86718 Personal history of other venous thrombosis and embolism: Secondary | ICD-10-CM | POA: Diagnosis not present

## 2020-01-05 DIAGNOSIS — R509 Fever, unspecified: Secondary | ICD-10-CM | POA: Diagnosis present

## 2020-01-05 DIAGNOSIS — G039 Meningitis, unspecified: Principal | ICD-10-CM | POA: Diagnosis present

## 2020-01-05 DIAGNOSIS — E782 Mixed hyperlipidemia: Secondary | ICD-10-CM | POA: Diagnosis not present

## 2020-01-05 DIAGNOSIS — Z825 Family history of asthma and other chronic lower respiratory diseases: Secondary | ICD-10-CM

## 2020-01-05 DIAGNOSIS — R627 Adult failure to thrive: Secondary | ICD-10-CM | POA: Diagnosis present

## 2020-01-05 DIAGNOSIS — R32 Unspecified urinary incontinence: Secondary | ICD-10-CM | POA: Diagnosis present

## 2020-01-05 DIAGNOSIS — E66811 Obesity, class 1: Secondary | ICD-10-CM | POA: Diagnosis present

## 2020-01-05 DIAGNOSIS — Z8249 Family history of ischemic heart disease and other diseases of the circulatory system: Secondary | ICD-10-CM

## 2020-01-05 DIAGNOSIS — Z6833 Body mass index (BMI) 33.0-33.9, adult: Secondary | ICD-10-CM | POA: Diagnosis not present

## 2020-01-05 DIAGNOSIS — M549 Dorsalgia, unspecified: Secondary | ICD-10-CM | POA: Diagnosis not present

## 2020-01-05 DIAGNOSIS — Z8673 Personal history of transient ischemic attack (TIA), and cerebral infarction without residual deficits: Secondary | ICD-10-CM | POA: Diagnosis not present

## 2020-01-05 DIAGNOSIS — M47817 Spondylosis without myelopathy or radiculopathy, lumbosacral region: Secondary | ICD-10-CM | POA: Diagnosis present

## 2020-01-05 DIAGNOSIS — G9340 Encephalopathy, unspecified: Secondary | ICD-10-CM | POA: Diagnosis not present

## 2020-01-05 DIAGNOSIS — I10 Essential (primary) hypertension: Secondary | ICD-10-CM | POA: Diagnosis present

## 2020-01-05 DIAGNOSIS — R3981 Functional urinary incontinence: Secondary | ICD-10-CM | POA: Diagnosis not present

## 2020-01-05 DIAGNOSIS — N400 Enlarged prostate without lower urinary tract symptoms: Secondary | ICD-10-CM | POA: Diagnosis present

## 2020-01-05 DIAGNOSIS — I959 Hypotension, unspecified: Secondary | ICD-10-CM | POA: Diagnosis not present

## 2020-01-05 DIAGNOSIS — G9341 Metabolic encephalopathy: Secondary | ICD-10-CM | POA: Diagnosis present

## 2020-01-05 DIAGNOSIS — Z888 Allergy status to other drugs, medicaments and biological substances status: Secondary | ICD-10-CM | POA: Diagnosis not present

## 2020-01-05 DIAGNOSIS — Z9181 History of falling: Secondary | ICD-10-CM

## 2020-01-05 DIAGNOSIS — Z7189 Other specified counseling: Secondary | ICD-10-CM | POA: Diagnosis not present

## 2020-01-05 DIAGNOSIS — D696 Thrombocytopenia, unspecified: Secondary | ICD-10-CM | POA: Diagnosis present

## 2020-01-05 DIAGNOSIS — E119 Type 2 diabetes mellitus without complications: Secondary | ICD-10-CM | POA: Diagnosis present

## 2020-01-05 DIAGNOSIS — Z801 Family history of malignant neoplasm of trachea, bronchus and lung: Secondary | ICD-10-CM

## 2020-01-05 DIAGNOSIS — R4182 Altered mental status, unspecified: Secondary | ICD-10-CM | POA: Diagnosis not present

## 2020-01-05 DIAGNOSIS — R52 Pain, unspecified: Secondary | ICD-10-CM | POA: Diagnosis not present

## 2020-01-05 DIAGNOSIS — S0990XA Unspecified injury of head, initial encounter: Secondary | ICD-10-CM | POA: Diagnosis not present

## 2020-01-05 DIAGNOSIS — Z7901 Long term (current) use of anticoagulants: Secondary | ICD-10-CM

## 2020-01-05 DIAGNOSIS — Z515 Encounter for palliative care: Secondary | ICD-10-CM

## 2020-01-05 DIAGNOSIS — M4726 Other spondylosis with radiculopathy, lumbar region: Secondary | ICD-10-CM | POA: Diagnosis not present

## 2020-01-05 DIAGNOSIS — M5416 Radiculopathy, lumbar region: Secondary | ICD-10-CM | POA: Diagnosis not present

## 2020-01-05 DIAGNOSIS — Z808 Family history of malignant neoplasm of other organs or systems: Secondary | ICD-10-CM

## 2020-01-05 DIAGNOSIS — Z79899 Other long term (current) drug therapy: Secondary | ICD-10-CM

## 2020-01-05 DIAGNOSIS — M109 Gout, unspecified: Secondary | ICD-10-CM | POA: Diagnosis not present

## 2020-01-05 DIAGNOSIS — N3000 Acute cystitis without hematuria: Secondary | ICD-10-CM | POA: Diagnosis not present

## 2020-01-05 DIAGNOSIS — F0151 Vascular dementia with behavioral disturbance: Secondary | ICD-10-CM | POA: Diagnosis not present

## 2020-01-05 DIAGNOSIS — Z66 Do not resuscitate: Secondary | ICD-10-CM | POA: Diagnosis not present

## 2020-01-05 DIAGNOSIS — Z86711 Personal history of pulmonary embolism: Secondary | ICD-10-CM | POA: Diagnosis not present

## 2020-01-05 DIAGNOSIS — N3090 Cystitis, unspecified without hematuria: Secondary | ICD-10-CM

## 2020-01-05 DIAGNOSIS — M545 Low back pain: Secondary | ICD-10-CM | POA: Diagnosis not present

## 2020-01-05 DIAGNOSIS — R519 Headache, unspecified: Secondary | ICD-10-CM | POA: Diagnosis not present

## 2020-01-05 LAB — URINALYSIS, ROUTINE W REFLEX MICROSCOPIC
Bacteria, UA: NONE SEEN
Bilirubin Urine: NEGATIVE
Glucose, UA: NEGATIVE mg/dL
Ketones, ur: 20 mg/dL — AB
Leukocytes,Ua: NEGATIVE
Nitrite: NEGATIVE
Protein, ur: NEGATIVE mg/dL
Specific Gravity, Urine: 1.016 (ref 1.005–1.030)
pH: 5 (ref 5.0–8.0)

## 2020-01-05 LAB — CBC WITH DIFFERENTIAL/PLATELET
Abs Immature Granulocytes: 0.02 10*3/uL (ref 0.00–0.07)
Basophils Absolute: 0 10*3/uL (ref 0.0–0.1)
Basophils Relative: 0 %
Eosinophils Absolute: 0.1 10*3/uL (ref 0.0–0.5)
Eosinophils Relative: 1 %
HCT: 41.3 % (ref 39.0–52.0)
Hemoglobin: 13.2 g/dL (ref 13.0–17.0)
Immature Granulocytes: 0 %
Lymphocytes Relative: 22 %
Lymphs Abs: 1.6 10*3/uL (ref 0.7–4.0)
MCH: 30.8 pg (ref 26.0–34.0)
MCHC: 32 g/dL (ref 30.0–36.0)
MCV: 96.5 fL (ref 80.0–100.0)
Monocytes Absolute: 0.8 10*3/uL (ref 0.1–1.0)
Monocytes Relative: 11 %
Neutro Abs: 4.9 10*3/uL (ref 1.7–7.7)
Neutrophils Relative %: 66 %
Platelets: 116 10*3/uL — ABNORMAL LOW (ref 150–400)
RBC: 4.28 MIL/uL (ref 4.22–5.81)
RDW: 13 % (ref 11.5–15.5)
WBC: 7.5 10*3/uL (ref 4.0–10.5)
nRBC: 0 % (ref 0.0–0.2)

## 2020-01-05 LAB — COMPREHENSIVE METABOLIC PANEL
ALT: 25 U/L (ref 0–44)
AST: 38 U/L (ref 15–41)
Albumin: 3.3 g/dL — ABNORMAL LOW (ref 3.5–5.0)
Alkaline Phosphatase: 49 U/L (ref 38–126)
Anion gap: 11 (ref 5–15)
BUN: 12 mg/dL (ref 8–23)
CO2: 22 mmol/L (ref 22–32)
Calcium: 8.6 mg/dL — ABNORMAL LOW (ref 8.9–10.3)
Chloride: 104 mmol/L (ref 98–111)
Creatinine, Ser: 1.05 mg/dL (ref 0.61–1.24)
GFR calc Af Amer: >60 mL/min (ref >60)
GFR calc non Af Amer: >60 mL/min (ref >60)
Glucose, Bld: 87 mg/dL (ref 70–99)
Potassium: 4.6 mmol/L (ref 3.5–5.1)
Sodium: 137 mmol/L (ref 135–145)
Total Bilirubin: 2.4 mg/dL — ABNORMAL HIGH (ref 0.3–1.2)
Total Protein: 5.9 g/dL — ABNORMAL LOW (ref 6.5–8.1)

## 2020-01-05 LAB — POCT I-STAT EG7
Acid-Base Excess: 3 mmol/L — ABNORMAL HIGH (ref 0.0–2.0)
Bicarbonate: 27.5 mmol/L (ref 20.0–28.0)
Calcium, Ion: 1.13 mmol/L — ABNORMAL LOW (ref 1.15–1.40)
HCT: 38 % — ABNORMAL LOW (ref 39.0–52.0)
Hemoglobin: 12.9 g/dL — ABNORMAL LOW (ref 13.0–17.0)
O2 Saturation: 63 %
Potassium: 4.2 mmol/L (ref 3.5–5.1)
Sodium: 139 mmol/L (ref 135–145)
TCO2: 29 mmol/L (ref 22–32)
pCO2, Ven: 40.8 mmHg — ABNORMAL LOW (ref 44.0–60.0)
pH, Ven: 7.436 — ABNORMAL HIGH (ref 7.250–7.430)
pO2, Ven: 31 mmHg — CL (ref 32.0–45.0)

## 2020-01-05 LAB — AMMONIA: Ammonia: 17 umol/L (ref 9–35)

## 2020-01-05 LAB — CBG MONITORING, ED: Glucose-Capillary: 75 mg/dL (ref 70–99)

## 2020-01-05 LAB — VALPROIC ACID LEVEL: Valproic Acid Lvl: 12 ug/mL — ABNORMAL LOW (ref 50.0–100.0)

## 2020-01-05 MED ORDER — LORAZEPAM 2 MG/ML IJ SOLN
0.5000 mg | INTRAMUSCULAR | Status: DC | PRN
  Administered 2020-01-05: 0.5 mg via INTRAVENOUS
  Filled 2020-01-05: qty 1

## 2020-01-05 NOTE — ED Notes (Signed)
Patient transported to CT 

## 2020-01-05 NOTE — ED Triage Notes (Signed)
Patient arrived from home; EMS reported wife c/o decreasing intake along w/ increasing weakness. Reported hx of dementia and parkinson's. Wife stated increasing altered mentation over the last few days. Reported a decubitus ulcer on buttocks as well and EMS medicated with fentanyl 100 mcg.

## 2020-01-05 NOTE — Patient Instructions (Signed)
Pre visit review using our clinic review tool, if applicable. No additional management support is needed unless otherwise documented below in the visit note. 

## 2020-01-05 NOTE — ED Provider Notes (Signed)
[facility] EMERGENCY DEPARTMENT Provider Note   CSN: [id number] Arrival date & time: 01/05/20  1157     History Chief Complaint  Patient presents with  . Weakness    Logan Valdez. is a 74 y.o. male.  74 y.o male with a PMH of HTN, DVT, DDD, Stroke presents to the ED via EMS for decline in mental status. According to wife at the bedside patient has refused to eat the last couple of days, he has been have increasing time sleeping.  He also reports he has not been wanting to take his medication.  She currently does not have any home health aide therapy that comes in to check on them.  She does report getting some help from her daughter.  According to wife patient seems to be somewhat more altered along with increasing confusion.  He is currently on Coumadin, according to her he had 2 falls one on February 8 the second 1 in February 11, unsure whether he struck his head.  She denies any recent fevers, sick contacts, patient complaints.  The history is provided by the patient and medical records.  Weakness Associated symptoms: no chest pain, no fever and no shortness of breath        Past Medical History:  Diagnosis Date  . Chronic lower back pain    Ramos  . DDD (degenerative disc disease), lumbar   . Dysmetabolic syndrome X   . Gout, unspecified   . History of DVT (deep vein thrombosis)   . History of pulmonary embolism   . History of stroke 2009   Sethi  . History of thrombophlebitis   . HTN (hypertension)   . Memory loss   . Mixed hyperlipidemia   . Obesity   . PFO (patent foramen ovale)   . Primary hypercoagulable state (HCC) 05/14/2008   Personal h/o DVT, PE, PFO, and lupus AC +   . Stage I pressure ulcer of left buttock 04/25/2019  . Stroke (HCC)   . Type II or unspecified type diabetes mellitus without mention of complication, uncontrolled   . Unspecified sleep apnea     Patient Active Problem List   Diagnosis Date Noted  . Xerosis of  skin 10/06/2019  . Balanitis 09/19/2018  . Scrotal swelling 09/19/2018  . Daytime somnolence 09/19/2018  . Agitation 05/31/2018  . Nocturia 05/01/2018  . Health maintenance examination 01/07/2018  . Long term (current) use of anticoagulants 11/15/2017  . Encounter for long-term (current) use of antiplatelets/antithrombotics 10/18/2017  . COPD (chronic obstructive pulmonary disease) (HCC) 03/05/2017  . Poor urinary stream 05/06/2016  . Urge incontinence 05/06/2016  . History of stroke 01/12/2016  . Chronic back pain 09/07/2015  . Leg cramps 06/22/2015  . Drug-induced constipation with proper administration 04/12/2015  . 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 Heme positive stool 01/13/2015  . Medicare annual wellness visit, subsequent 12/22/2014  . Advanced care planning/counseling discussion 12/22/2014  . B12 deficiency 12/13/2014  . Vascular dementia (HCC) 12/09/2014  . Encounter for therapeutic drug monitoring 12/17/2013  . 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 HTN (hypertension) 09/18/2012  . PFO (patent foramen ovale) 04/08/2009  . OSA (obstructive sleep apnea) 04/08/2009  . COMPRESSION FRACTURE, L4 VERTEBRA 11/04/2008  . Diabetes mellitus type 2, controlled, with complications (HCC) 05/14/2008  . Primary hypercoagulable state (HCC) 05/14/2008  . Lumbar radiculopathy 05/14/2008  . Gout 12/09/2007  . METABOLIC SYNDROME X 12/09/2007  . Severe obesity (BMI  35.0-39.9) with comorbidity (HCC) 12/09/2007  . PULMONARY EMBOLISM, HX OF 12/09/2007  . DEEP VENOUS THROMBOPHLEBITIS, HX OF 12/09/2007  . HYPERLIPIDEMIA 12/05/2007    Past Surgical History:  Procedure Laterality Date  . COLONOSCOPY  2000   [facility]  . WRIST SURGERY     Right wrist repair post trauma       Family History  Problem Relation Age of Onset  . Emphysema Father   . Diabetes Brother   . Hypertension Brother   . Heart attack Brother 43  .  Skin cancer Brother   . Lung cancer Brother   . Cancer Maternal Aunt        ? Type   . Stroke Neg Hx     Social History   Tobacco Use  . Smoking status: Never Smoker  . Smokeless tobacco: Never Used  Substance Use Topics  . Alcohol use: No    Alcohol/week: 0.0 standard drinks  . Drug use: No    Home Medications Prior to Admission medications   Medication Sig Start Date End Date Taking? Authorizing Provider  acetaminophen (TYLENOL) 500 MG tablet Take 500 mg by mouth 3 (three) times daily.    [provider]  amLODipine (NORVASC) 2.5 MG tablet Take 1 tablet by mouth once daily 07/04/19   Eustaquio Boyden, MD  atorvastatin (LIPITOR) 40 MG tablet Take 1 tablet (40 mg total) by mouth daily. 06/19/19   Eustaquio Boyden, MD  bacitracin ointment Apply 1 application topically 2 (two) times daily. 11/25/19   Derwood Kaplan, MD  clotrimazole (LOTRIMIN) 1 % cream Apply 1 application topically 2 (two) times daily. 09/19/18   Eustaquio Boyden, MD  cyanocobalamin 500 MCG tablet Take 500 mcg by mouth daily. Vitamin B12    [provider]  dextromethorphan (DELSYM) 30 MG/5ML liquid Take 15-60 mg by mouth 2 (two) times daily as needed for cough.    [provider]  divalproex (DEPAKOTE SPRINKLE) 125 MG capsule Take 1 capsule by mouth at bedtime 12/08/19   Ihor Austin, NP  docusate sodium (COLACE) 100 MG capsule Take 100 mg by mouth daily as needed for mild constipation.    [provider]  finasteride (PROSCAR) 5 MG tablet Take 1 tablet by mouth once daily 07/04/19   Eustaquio Boyden, MD  FOLIC ACID PO Take 3 tablets by mouth 2 (two) times daily.    [provider]  gabapentin (NEURONTIN) 300 MG capsule Take 1 capsule (300 mg total) by mouth 2 (two) times daily. 12/12/19   Eustaquio Boyden, MD  guaiFENesin (MUCINEX) 600 MG 12 hr tablet Take 600-1,200 mg by mouth 2 (two) times daily as needed for cough or to loosen phlegm.    [provider]    Hydroactive Dressings (TEGADERM HYDROCOLLOID) MISC Apply 1 each topically once a week. 06/30/19   Eustaquio Boyden, MD  ipratropium-albuterol (DUONEB) 0.5-2.5 (3) MG/3ML SOLN Take 3 mLs by nebulization every 2 (two) hours as needed. 02/19/17   Dorothea Ogle, MD  LORazepam (ATIVAN) 0.5 MG tablet Take 0.5-1 tablets (0.25-0.5 mg total) by mouth daily as needed for anxiety. 09/19/18   Eustaquio Boyden, MD  memantine (NAMENDA) 10 MG tablet Take 1 tablet (10 mg total) by mouth 2 (two) times daily. 12/15/19   Ihor Austin, NP  metoprolol tartrate (LOPRESSOR) 25 MG tablet Take 1/2 (one-half) tablet by mouth twice daily 11/14/19   Eustaquio Boyden, MD  polyethylene glycol powder (GLYCOLAX/MIRALAX) powder DISSOLVE 1 CAPFUL (17GM) INTO 4 TO 8 OUNCES OF FLUID AND  TAKE BY MOUTH ONCE DAILY AS DIRECTED 05/01/18   Ria Bush, MD  Pyridoxine HCl (VITAMIN B-6 PO) Take 1 tablet by mouth 2 (two) times daily.    [provider]  triamcinolone cream (KENALOG) 0.1 % Apply 1 application topically 2 (two) times daily as needed (dry skin with rash). Apply to [place]. 10/06/19   Ria Bush, MD  warfarin (COUMADIN) 5 MG tablet TAKE AS DIRECTED BY  COUMADIN  CLINIC 07/04/19   Ria Bush, MD  metoprolol succinate (TOPROL-XL) 100 MG 24 hr tablet 1/2 by mouth daily 12/12/12 02/12/13  Hendricks Limes, MD    Allergies    Hydrochlorothiazide, Indomethacin, Pravastatin sodium, Exelon [rivastigmine tartrate], Lisinopril, and Micardis [telmisartan]  Review of Systems   Review of Systems  Unable to perform ROS: Dementia  Constitutional: Negative for fever.  Respiratory: Negative for shortness of breath.   Cardiovascular: Negative for chest pain.  Neurological: Positive for weakness.    Physical Exam Updated Vital Signs BP (!) 160/100   Pulse 68   Temp 99.3 F (37.4 C) (Rectal)   Resp (!) 24   SpO2 97%   Physical Exam Vitals and nursing note reviewed.  Constitutional:      Appearance: Normal  appearance. He is ill-appearing.  HENT:     Head: Normocephalic and atraumatic.     Nose: Nose normal.     Mouth/Throat:     Mouth: Mucous membranes are moist.  Eyes:     Pupils: Pupils are equal, round, and reactive to light.  Cardiovascular:     Rate and Rhythm: Normal rate.  Pulmonary:     Effort: Pulmonary effort is normal.  Abdominal:     General: Abdomen is flat.  Musculoskeletal:     Cervical back: Normal range of motion and neck supple.  Skin:    General: Skin is warm and dry.          Comments: Decubitus ulcer noted to the area, no drainage non erythematous.   Neurological:     Mental Status: He is alert. Mental status is at baseline.     ED Results / Procedures / Treatments   Labs (all labs ordered are listed, but only abnormal results are displayed) Labs Reviewed  CBC WITH DIFFERENTIAL/PLATELET - Abnormal; Notable for the following components:      Result Value   Platelets 116 (*)    All other components within normal limits  COMPREHENSIVE METABOLIC PANEL - Abnormal; Notable for the following components:   Calcium 8.6 (*)    Total Protein 5.9 (*)    Albumin 3.3 (*)    Total Bilirubin 2.4 (*)    All other components within normal limits  URINALYSIS, ROUTINE W REFLEX MICROSCOPIC  AMMONIA  CBG MONITORING, ED  I-STAT VENOUS BLOOD GAS, ED    EKG EKG Interpretation  Date/Time:  Monday January 05 2020 12:03:29 EST Ventricular Rate:  67 PR Interval:    QRS Duration: 200 QT Interval:  419 QTC Calculation: 443 R Axis:   39 Text Interpretation: Sinus rhythm Short PR interval Nonspecific intraventricular conduction delay artifact but no apparent change from old tracing Confirmed by Charlesetta Shanks [phone]) on 01/05/2020 12:21:38 PM   Radiology DG Chest 2 View  Result Date: 01/05/2020 CLINICAL DATA:  Altered mental status, weakness EXAM: CHEST - 2 VIEW COMPARISON:  02/16/2017 FINDINGS: The heart size and mediastinal contours are within normal limits. Both lungs  are clear. Disc degenerative disease of the thoracic spine. IMPRESSION: No acute abnormality of the lungs.  Electronically Signed   By: Lauralyn Primes M.D.   On: 01/05/2020 12:33   CT Head Wo Contrast  Result Date: 01/05/2020 CLINICAL DATA:  74 year old male with history of head trauma presenting with headache. EXAM: CT HEAD WITHOUT CONTRAST TECHNIQUE: Contiguous axial images were obtained from the base of the skull through the vertex without intravenous contrast. COMPARISON:  Head CT 11/24/2019. FINDINGS: Brain: Mild cerebral atrophy. Patchy and confluent areas of decreased attenuation are noted throughout the deep and periventricular white matter of the cerebral hemispheres bilaterally, compatible with chronic microvascular ischemic disease. More well-defined area of low attenuation in the occipital lobe white matter compatible with an area of encephalomalacia/gliosis related to old left occipital infarct. No evidence of acute infarction, hemorrhage, hydrocephalus, extra-axial collection or mass lesion/mass effect. Vascular: No hyperdense vessel or unexpected calcification. Skull: Normal. Negative for fracture or focal lesion. Sinuses/Orbits: No acute finding. Other: None. IMPRESSION: 1. No evidence of significant acute traumatic injury to the skull or brain. 2. Mild cerebral atrophy with chronic microvascular ischemic changes in the cerebral white matter and old left occipital lobe infarct, similar to the prior study, as above. Electronically Signed   By: Trudie Reed M.D.   On: 01/05/2020 14:47    Procedures Procedures (including critical care time)  Medications Ordered in ED Medications - No data to display  ED Course  I have reviewed the triage vital signs and the nursing notes.  Pertinent labs & imaging results that were available during my care of the patient were reviewed by me and considered in my medical decision making (see chart for details).  Clinical Course as of Jan 05 1600  Mon  Jan 05, 2020  1559 Patient with generalized weakness and wife inability to care for him at home x3 days. 2 recent falls in February. +decubitus ulcer. CM Wanda to see. PT/OT   [KM]    Clinical Course User Index [KM] Jeral Pinch   MDM Rules/Calculators/A&P                       Patient with a PMH of Dementia, CVA, Parkinson's presents to the ED with a chief complaint of decrease in activity per wife. She reports patient has been refusing his medication, has been decreasing his food intake, along with has been less active.  During my evaluation patient is acute on chronic ill-appearing.  Lungs are diminished to auscultation but patient does not take a deep breath.  He is able to follow commands but then gets easily distracted playing with blanket on today's visit.  Interpretation of his labs revealed a CBC without any leukocytosis, hemoglobin is within normal limits.  Platelets are decreased at 116.  CMP without any electrolyte abnormality, creatinine level is within normal limits, LFTs are unremarkable.  CBG was within normal limits on arrival.  UA is currently pending.  CT  Head showed: 1. No evidence of significant acute traumatic injury to the skull or  brain.  2. Mild cerebral atrophy with chronic microvascular ischemic changes  in the cerebral white matter and old left occipital lobe infarct,  similar to the prior study, as above.     X-ray without any abnormality.  Attempting to get UA at this time per wife and nursing staff.  And is currently on warfarin 5 mg, according to report at the bedside last INR was 1.8, was told to skip his warfarin.  I have discussed patient with Dr. Donnald Garre who has evaluated patient,  Patient care to signed out to incoming team pending UA, blood gas, Ammonia.     Portions of this note were generated with Scientist, clinical (histocompatibility and immunogenetics). Dictation errors may occur despite best attempts at proofreading.  Final Clinical Impression(s) / ED  Diagnoses Final diagnoses:  Generalized weakness    Rx / DC Orders ED Discharge Orders    None       Claude Manges, PA-C 01/05/20 1601    Arby Barrette, MD 01/14/20 [phone]

## 2020-01-05 NOTE — Care Management (Signed)
ED CM met with patient 's spouse at bedside, she reports in the last week he has not been able to walk, and she has not been able to get him up out of bed.  Family goals are for patient to be able to walk  and return to baseline.. PT evaluation has been ordered. TOC team  will continue to follow

## 2020-01-05 NOTE — Progress Notes (Signed)
Contacted pt's wife, Logan Valdez, who reports pt is not able to get up any longer on his own and she and pt's grandson had to lift him and it is too difficult for her and him. Pt is currently in a chair but she was upset that she can no longer take care of him and she thinks it would be best to call EMS. Advised to call EMS and this nurse would f/u later today or tomorrow. Logan Valdez verbalized understanding.

## 2020-01-05 NOTE — ED Provider Notes (Signed)
Medical screening examination/treatment/procedure(s) were conducted as a shared visit with non-physician practitioner(s) and myself.  I personally evaluated the patient during the encounter.  EKG Interpretation  Date/Time:  Monday January 05 2020 12:03:29 EST Ventricular Rate:  67 PR Interval:    QRS Duration: 200 QT Interval:  419 QTC Calculation: 443 R Axis:   39 Text Interpretation: Sinus rhythm Short PR interval Nonspecific intraventricular conduction delay artifact but no apparent change from old tracing Confirmed by Arby Barrette [phone] on 01/05/2020 12:21:38 PM Patient has Parkinson's and dementia.  He has had worsening confusion and significant difficulty with any assistance with ADLs.  He is now unable to assist with walking or transfers.  His wife reports symptoms have been gradually worsening but have gotten to a point that she cannot even get him to eat for the past few days.  He has been refusing medications.  Patient is awake and alert but confused.  No respiratory distress at rest.  Heart regular.  Abdomen soft without guarding.  Patient is very resistant to straightening out his legs.  He does have a sacral decubitus.  Patient shows confusion and failing ADL.  We will add additional diagnostic studies of ammonia and venous gas.  Patient will need reassessment.  At this time patient may need admission or SNF placement.   Arby Barrette, MD 01/06/20 [phone]

## 2020-01-05 NOTE — Telephone Encounter (Signed)
[employer] on [address] and niece Ms. Odis Luster who is not on DPR called stating that the pt is in the ER right now and they are having trouble getting the pt to the bathroom and in the car and it seems that the pt is having trouble walking. Please advise.

## 2020-01-06 ENCOUNTER — Ambulatory Visit: Payer: Self-pay | Admitting: Adult Health

## 2020-01-06 ENCOUNTER — Emergency Department (HOSPITAL_COMMUNITY): Payer: PPO

## 2020-01-06 ENCOUNTER — Telehealth: Payer: Self-pay

## 2020-01-06 ENCOUNTER — Inpatient Hospital Stay (HOSPITAL_COMMUNITY): Payer: PPO

## 2020-01-06 ENCOUNTER — Encounter (HOSPITAL_COMMUNITY): Payer: Self-pay | Admitting: Radiology

## 2020-01-06 ENCOUNTER — Other Ambulatory Visit: Payer: Self-pay

## 2020-01-06 DIAGNOSIS — E669 Obesity, unspecified: Secondary | ICD-10-CM

## 2020-01-06 DIAGNOSIS — G9340 Encephalopathy, unspecified: Secondary | ICD-10-CM | POA: Diagnosis not present

## 2020-01-06 DIAGNOSIS — F0151 Vascular dementia with behavioral disturbance: Secondary | ICD-10-CM | POA: Diagnosis not present

## 2020-01-06 DIAGNOSIS — G9341 Metabolic encephalopathy: Secondary | ICD-10-CM | POA: Diagnosis present

## 2020-01-06 DIAGNOSIS — Z6833 Body mass index (BMI) 33.0-33.9, adult: Secondary | ICD-10-CM | POA: Diagnosis not present

## 2020-01-06 DIAGNOSIS — D6859 Other primary thrombophilia: Secondary | ICD-10-CM | POA: Diagnosis present

## 2020-01-06 DIAGNOSIS — D696 Thrombocytopenia, unspecified: Secondary | ICD-10-CM | POA: Diagnosis present

## 2020-01-06 DIAGNOSIS — Z7901 Long term (current) use of anticoagulants: Secondary | ICD-10-CM | POA: Diagnosis not present

## 2020-01-06 DIAGNOSIS — Z86711 Personal history of pulmonary embolism: Secondary | ICD-10-CM | POA: Diagnosis not present

## 2020-01-06 DIAGNOSIS — Z20822 Contact with and (suspected) exposure to covid-19: Secondary | ICD-10-CM | POA: Diagnosis present

## 2020-01-06 DIAGNOSIS — I10 Essential (primary) hypertension: Secondary | ICD-10-CM | POA: Diagnosis present

## 2020-01-06 DIAGNOSIS — M5416 Radiculopathy, lumbar region: Secondary | ICD-10-CM | POA: Diagnosis not present

## 2020-01-06 DIAGNOSIS — G8929 Other chronic pain: Secondary | ICD-10-CM | POA: Diagnosis present

## 2020-01-06 DIAGNOSIS — F05 Delirium due to known physiological condition: Secondary | ICD-10-CM | POA: Diagnosis present

## 2020-01-06 DIAGNOSIS — F0150 Vascular dementia without behavioral disturbance: Secondary | ICD-10-CM | POA: Diagnosis present

## 2020-01-06 DIAGNOSIS — Z8673 Personal history of transient ischemic attack (TIA), and cerebral infarction without residual deficits: Secondary | ICD-10-CM | POA: Diagnosis not present

## 2020-01-06 DIAGNOSIS — R791 Abnormal coagulation profile: Secondary | ICD-10-CM | POA: Diagnosis present

## 2020-01-06 DIAGNOSIS — R531 Weakness: Secondary | ICD-10-CM | POA: Diagnosis not present

## 2020-01-06 DIAGNOSIS — Z8672 Personal history of thrombophlebitis: Secondary | ICD-10-CM | POA: Diagnosis not present

## 2020-01-06 DIAGNOSIS — Z888 Allergy status to other drugs, medicaments and biological substances status: Secondary | ICD-10-CM | POA: Diagnosis not present

## 2020-01-06 DIAGNOSIS — E782 Mixed hyperlipidemia: Secondary | ICD-10-CM | POA: Diagnosis present

## 2020-01-06 DIAGNOSIS — Z86718 Personal history of other venous thrombosis and embolism: Secondary | ICD-10-CM | POA: Diagnosis not present

## 2020-01-06 DIAGNOSIS — Z515 Encounter for palliative care: Secondary | ICD-10-CM | POA: Diagnosis not present

## 2020-01-06 DIAGNOSIS — Z9181 History of falling: Secondary | ICD-10-CM | POA: Diagnosis not present

## 2020-01-06 DIAGNOSIS — Z66 Do not resuscitate: Secondary | ICD-10-CM | POA: Diagnosis not present

## 2020-01-06 DIAGNOSIS — M109 Gout, unspecified: Secondary | ICD-10-CM | POA: Diagnosis present

## 2020-01-06 DIAGNOSIS — G039 Meningitis, unspecified: Secondary | ICD-10-CM | POA: Diagnosis present

## 2020-01-06 DIAGNOSIS — M47817 Spondylosis without myelopathy or radiculopathy, lumbosacral region: Secondary | ICD-10-CM | POA: Diagnosis present

## 2020-01-06 DIAGNOSIS — Z7189 Other specified counseling: Secondary | ICD-10-CM | POA: Diagnosis not present

## 2020-01-06 DIAGNOSIS — R509 Fever, unspecified: Secondary | ICD-10-CM | POA: Diagnosis not present

## 2020-01-06 DIAGNOSIS — M4726 Other spondylosis with radiculopathy, lumbar region: Secondary | ICD-10-CM | POA: Diagnosis present

## 2020-01-06 DIAGNOSIS — M549 Dorsalgia, unspecified: Secondary | ICD-10-CM | POA: Diagnosis present

## 2020-01-06 LAB — LACTIC ACID, PLASMA: Lactic Acid, Venous: 1.3 mmol/L (ref 0.5–1.9)

## 2020-01-06 LAB — RESPIRATORY PANEL BY PCR

## 2020-01-06 LAB — CBC
HCT: 42 % (ref 39.0–52.0)
Hemoglobin: 13.9 g/dL (ref 13.0–17.0)
MCH: 31.2 pg (ref 26.0–34.0)
MCHC: 33.1 g/dL (ref 30.0–36.0)
MCV: 94.4 fL (ref 80.0–100.0)
Platelets: 132 10*3/uL — ABNORMAL LOW (ref 150–400)
RBC: 4.45 MIL/uL (ref 4.22–5.81)
RDW: 12.6 % (ref 11.5–15.5)
WBC: 9.6 10*3/uL (ref 4.0–10.5)
nRBC: 0 % (ref 0.0–0.2)

## 2020-01-06 LAB — PROTIME-INR
INR: 3.4 — ABNORMAL HIGH (ref 0.8–1.2)
Prothrombin Time: 33.9 seconds — ABNORMAL HIGH (ref 11.4–15.2)

## 2020-01-06 LAB — TSH: TSH: 1.535 u[IU]/mL (ref 0.350–4.500)

## 2020-01-06 LAB — PROCALCITONIN: Procalcitonin: <0.1 ng/mL

## 2020-01-06 LAB — MAGNESIUM: Magnesium: 1.7 mg/dL (ref 1.7–2.4)

## 2020-01-06 LAB — SEDIMENTATION RATE: Sed Rate: 25 mm/hr — ABNORMAL HIGH (ref 0–16)

## 2020-01-06 LAB — C-REACTIVE PROTEIN: CRP: 2.8 mg/dL — ABNORMAL HIGH (ref <1.0)

## 2020-01-06 LAB — POC SARS CORONAVIRUS 2 AG -  ED: SARS Coronavirus 2 Ag: NEGATIVE

## 2020-01-06 LAB — SARS CORONAVIRUS 2 (TAT 6-24 HRS): SARS Coronavirus 2: NEGATIVE

## 2020-01-06 MED ORDER — SODIUM CHLORIDE 0.9 % IV SOLN
2.0000 g | INTRAVENOUS | Status: AC
  Administered 2020-01-06: 2 g via INTRAVENOUS
  Filled 2020-01-06: qty 2000

## 2020-01-06 MED ORDER — SODIUM CHLORIDE 0.9 % IV SOLN
1.0000 g | INTRAVENOUS | Status: DC
  Filled 2020-01-06: qty 10

## 2020-01-06 MED ORDER — IOHEXOL 300 MG/ML  SOLN
100.0000 mL | Freq: Once | INTRAMUSCULAR | Status: AC | PRN
  Administered 2020-01-06: 100 mL via INTRAVENOUS

## 2020-01-06 MED ORDER — SODIUM CHLORIDE 0.9 % IV SOLN
2.0000 g | INTRAVENOUS | Status: DC
  Administered 2020-01-06 – 2020-01-08 (×9): 2 g via INTRAVENOUS
  Filled 2020-01-06: qty 2
  Filled 2020-01-06: qty 2000
  Filled 2020-01-06: qty 2
  Filled 2020-01-06 (×3): qty 2000
  Filled 2020-01-06: qty 2
  Filled 2020-01-06: qty 2000
  Filled 2020-01-06 (×2): qty 2
  Filled 2020-01-06 (×2): qty 2000
  Filled 2020-01-06: qty 2

## 2020-01-06 MED ORDER — ACETAMINOPHEN 650 MG RE SUPP
650.0000 mg | Freq: Once | RECTAL | Status: AC
  Administered 2020-01-06: 650 mg via RECTAL
  Filled 2020-01-06: qty 1

## 2020-01-06 MED ORDER — SODIUM CHLORIDE 0.9% FLUSH
3.0000 mL | Freq: Two times a day (BID) | INTRAVENOUS | Status: DC
  Administered 2020-01-06 – 2020-01-07 (×2): 3 mL via INTRAVENOUS

## 2020-01-06 MED ORDER — SODIUM CHLORIDE 0.9 % IV SOLN
2.0000 g | Freq: Two times a day (BID) | INTRAVENOUS | Status: DC
  Administered 2020-01-06 – 2020-01-08 (×4): 2 g via INTRAVENOUS
  Filled 2020-01-06: qty 2
  Filled 2020-01-06: qty 20
  Filled 2020-01-06: qty 2
  Filled 2020-01-06 (×2): qty 20

## 2020-01-06 MED ORDER — DOCUSATE SODIUM 100 MG PO CAPS: 100.0000 mg | ORAL_CAPSULE | Freq: Every day | ORAL | Status: DC | PRN

## 2020-01-06 MED ORDER — MEMANTINE HCL 10 MG PO TABS: 10.0000 mg | ORAL_TABLET | Freq: Two times a day (BID) | ORAL | Status: DC

## 2020-01-06 MED ORDER — TRIAMCINOLONE ACETONIDE 0.1 % EX CREA
1.0000 "application " | TOPICAL_CREAM | Freq: Two times a day (BID) | CUTANEOUS | Status: DC | PRN
  Filled 2020-01-06: qty 15

## 2020-01-06 MED ORDER — FINASTERIDE 5 MG PO TABS: 5.0000 mg | ORAL_TABLET | Freq: Every day | ORAL | Status: DC

## 2020-01-06 MED ORDER — ATORVASTATIN CALCIUM 40 MG PO TABS: 40.0000 mg | ORAL_TABLET | Freq: Every day | ORAL | Status: DC

## 2020-01-06 MED ORDER — SODIUM CHLORIDE 0.9 % IV SOLN
1.0000 g | Freq: Once | INTRAVENOUS | Status: AC
  Administered 2020-01-06: 1 g via INTRAVENOUS
  Filled 2020-01-06: qty 10

## 2020-01-06 MED ORDER — VANCOMYCIN HCL 2000 MG/400ML IV SOLN
2000.0000 mg | Freq: Once | INTRAVENOUS | Status: AC
  Administered 2020-01-06: 2000 mg via INTRAVENOUS
  Filled 2020-01-06: qty 400

## 2020-01-06 MED ORDER — AMLODIPINE BESYLATE 2.5 MG PO TABS: 2.5000 mg | ORAL_TABLET | Freq: Every day | ORAL | Status: DC

## 2020-01-06 MED ORDER — METOPROLOL TARTRATE 25 MG PO TABS: 12.5000 mg | ORAL_TABLET | Freq: Two times a day (BID) | ORAL | Status: DC

## 2020-01-06 MED ORDER — DEXAMETHASONE SODIUM PHOSPHATE 10 MG/ML IJ SOLN
10.0000 mg | Freq: Once | INTRAMUSCULAR | Status: DC
  Filled 2020-01-06: qty 1

## 2020-01-06 MED ORDER — SODIUM CHLORIDE 0.9 % IV SOLN: INTRAVENOUS | Status: DC

## 2020-01-06 MED ORDER — TEGADERM HYDROCOLLOID EX MISC: 1.0000 | CUTANEOUS | Status: DC

## 2020-01-06 MED ORDER — DIVALPROEX SODIUM 125 MG PO CSDR
125.0000 mg | DELAYED_RELEASE_CAPSULE | Freq: Every day | ORAL | Status: DC
  Filled 2020-01-06: qty 1

## 2020-01-06 MED ORDER — ACETAMINOPHEN 650 MG RE SUPP: 650.0000 mg | Freq: Four times a day (QID) | RECTAL | Status: DC | PRN

## 2020-01-06 MED ORDER — DEXTROSE 5 % IV SOLN
10.0000 mg/kg | Freq: Three times a day (TID) | INTRAVENOUS | Status: DC
  Administered 2020-01-06 – 2020-01-08 (×5): 780 mg via INTRAVENOUS
  Filled 2020-01-06 (×7): qty 15.6

## 2020-01-06 MED ORDER — CALCIUM GLUCONATE-NACL 1-0.675 GM/50ML-% IV SOLN
1.0000 g | Freq: Once | INTRAVENOUS | Status: AC
  Administered 2020-01-06: 1000 mg via INTRAVENOUS
  Filled 2020-01-06: qty 50

## 2020-01-06 MED ORDER — LABETALOL HCL 5 MG/ML IV SOLN: 5.0000 mg | INTRAVENOUS | Status: DC | PRN

## 2020-01-06 MED ORDER — IPRATROPIUM-ALBUTEROL 0.5-2.5 (3) MG/3ML IN SOLN: 3.0000 mL | RESPIRATORY_TRACT | Status: DC | PRN

## 2020-01-06 MED ORDER — MORPHINE SULFATE (PF) 2 MG/ML IV SOLN
2.0000 mg | INTRAVENOUS | Status: DC | PRN
  Administered 2020-01-06 – 2020-01-07 (×2): 2 mg via INTRAVENOUS
  Filled 2020-01-06 (×2): qty 1

## 2020-01-06 MED ORDER — LORAZEPAM 2 MG/ML IJ SOLN: 0.5000 mg | Freq: Four times a day (QID) | INTRAMUSCULAR | Status: DC | PRN

## 2020-01-06 MED ORDER — VANCOMYCIN HCL 1250 MG/250ML IV SOLN: 1250.0000 mg | INTRAVENOUS | Status: DC

## 2020-01-06 MED ORDER — GUAIFENESIN ER 600 MG PO TB12: 600.0000 mg | ORAL_TABLET | Freq: Two times a day (BID) | ORAL | Status: DC | PRN

## 2020-01-06 MED ORDER — VANCOMYCIN HCL IN DEXTROSE 1-5 GM/200ML-% IV SOLN: 1000.0000 mg | Freq: Once | INTRAVENOUS | Status: DC

## 2020-01-06 MED ORDER — GABAPENTIN 300 MG PO CAPS: 300.0000 mg | ORAL_CAPSULE | Freq: Two times a day (BID) | ORAL | Status: DC

## 2020-01-06 NOTE — ED Notes (Signed)
Patient bedding changed.  PA in to see patient.  Patient status changed.  Wife updated on POC

## 2020-01-06 NOTE — Progress Notes (Signed)
EEG complete - results pending 

## 2020-01-06 NOTE — ED Notes (Signed)
PT at bedside.

## 2020-01-06 NOTE — ED Notes (Signed)
Pt to CT scan.

## 2020-01-06 NOTE — ED Notes (Signed)
Pt returned from CT °

## 2020-01-06 NOTE — Progress Notes (Signed)
NP called by neurology after consult about their concerns about this pt deteriorating and giving opinion that the pt should be a DNR and transferred to higher level of care. Apparently, pt is basically unresponsive for neuro. Neuro started empiric tx for meningitis. This NP not on campus, so called colleague at [facility] to see pt and make decisions about care. Discussed case with Katherina Right, NP. Hortencia Conradi, NP [facility]

## 2020-01-06 NOTE — Progress Notes (Signed)
ANTICOAGULATION CONSULT NOTE  Pharmacy Consult for warfarin Indication: history of DVT   Assessment: 68 yom with history of DVT on warfarin presenting acute encephalopathy, fever. Pharmacy consulted to dose warfarin inpatient. INR supratherapeutic at 3.4 on admit (previously 3.5 on 2/28). Appears doses were held 2/28 and 3/1. Hg wnl, plt improved to 132. Noted mild hematuria on presentation. Patient has reportedly had multiple falls recently. CT head negative for bleed.  PTA warfarin dose: 2.5mg  daily except 5mg  on Monday (last dose 2/27 PTA)  Goal of Therapy:  INR 2-3 Monitor platelets by anticoagulation protocol: Yes   Plan:  Hold warfarin tonight Daily INR Monitor CBC, s/sx bleeding  3/27, PharmD, BCPS Clinical Pharmacist 01/06/2020 9:47 AM

## 2020-01-06 NOTE — Progress Notes (Signed)
Order noted to place foley/coude catheter. Will order supplies for procedure. Wife at bedside. Update given. Verbalized understanding.

## 2020-01-06 NOTE — Social Work (Signed)
CSW stopped by pt room, at this time pt asleep did not arouse upon my entrance. CSW called pt wife [name] at [phone], she answered and is on her way home. Requested I call back within about an hr.  Octavio Graves, MSW, LCSW [facility] Clinical Social Work

## 2020-01-06 NOTE — Telephone Encounter (Signed)
I called Karenann Cai, daughter in law.  Pt admitted to [facility] this am for fever UTI, weakness. Wife cannot take care of him.  Pt not able to do anything for him.  Safety is factor for both pt and wife. SW may become involved.  She will call back as time is closer for discharge and need for f/u appt.  (preferably one that is VV).  Will address at that time.

## 2020-01-06 NOTE — Progress Notes (Signed)
Pt remains NPO pending SLP eval. MD made aware. Pt remains confused. Frequent coughing noted. MD to see patient and orders to follow.

## 2020-01-06 NOTE — Consult Note (Addendum)
Neurology Consultation  Reason for Consult: Altered mental status and possible meningitis Referring Physician: Dr. Eugenie Filler  CC: Altered mental status possible meningitis  History is obtained from: Chart  HPI: Logan Valdez. is a 74 y.o. male with history of diabetes, stage I pressure ulcer of left buttock, stroke, PFO, hypertension, memory loss, pulmonary embolism and DVT on chronic Coumadin.  Patient was brought to the hospital secondary to having increased difficulty walking, increased confusion and lack of eating and or taking medications.  During exam initially he was noted to have an elevated temperature along with stiff neck which brought up concern for possible meningitis.  For this reason neurology was consulted.  Currently patient is unable to give any history.  He is laying in bed with bilateral arm tremors, moaning with increased moaning when touched.   ED course On admission patient was noted to be febrile up to 101.1, respirations 15-25 and elevated blood pressure of 171/137.  Patient's INR was elevated at 3.5 and ammonia level was 17.  Patient's urinalysis showed no ketones leukocytes or bacteria.  Chest x-ray was clear.  CT of patient's head showed no evidence of significant acute traumatic injury to the skull or brain.  Mild cerebral atrophy with chronic microvascular ischemic changes in the white matter and old left occipital lobe infarct similar to prior study. Chart review-patient is followed [facility] for patient's previous left posterior cerebral artery infarct in the occipital lobe.  Last office visit was on 07/03/2019.  Per exam patient mentation at that time was alert.  Followed commands.  He was slow with occasional hesitancy.  MMSE score was 12.    Past Medical History:  Diagnosis Date  . Chronic lower back pain    Ramos  . DDD (degenerative disc disease), lumbar   . Dysmetabolic syndrome X   . Gout, unspecified   . History of DVT  (deep vein thrombosis)   . History of pulmonary embolism   . History of stroke 2009   Sethi  . History of thrombophlebitis   . HTN (hypertension)   . Memory loss   . Mixed hyperlipidemia   . Obesity   . PFO (patent foramen ovale)   . Primary hypercoagulable state (HCC) 05/14/2008   Personal h/o DVT, PE, PFO, and lupus AC +   . Stage I pressure ulcer of left buttock 04/25/2019  . Stroke (HCC)   . Type II or unspecified type diabetes mellitus without mention of complication, uncontrolled   . Unspecified sleep apnea      Family History  Problem Relation Age of Onset  . Emphysema Father   . Diabetes Brother   . Hypertension Brother   . Heart attack Brother 43  . Skin cancer Brother   . Lung cancer Brother   . Cancer Maternal Aunt        ? Type   . Stroke Neg Hx     Social History:   reports that he has never smoked. He has never used smokeless tobacco. He reports that he does not drink alcohol or use drugs.  Medications  Current Facility-Administered Medications:  .  0.9 %  sodium chloride infusion, , Intravenous, Continuous, Smith, Rondell A, MD, Last Rate: 75 mL/hr at 01/06/20 1650, New Bag at 01/06/20 1650 .  acetaminophen (TYLENOL) suppository 650 mg, 650 mg, Rectal, Q6H PRN, Smith, Rondell A, MD .  amLODipine (NORVASC) tablet 2.5 mg, 2.5 mg, Oral, Daily, Smith, Rondell A, MD .  ampicillin (OMNIPEN) 2  g in sodium chloride 0.9 % 100 mL IVPB, 2 g, Intravenous, STAT, Smith, Rondell A, MD .  ampicillin (OMNIPEN) 2 g in sodium chloride 0.9 % 100 mL IVPB, 2 g, Intravenous, Q4H, [place], Lynita Lombard, [place] .  atorvastatin (LIPITOR) tablet 40 mg, 40 mg, Oral, Daily, Smith, Rondell A, MD .  cefTRIAXone (ROCEPHIN) 2 g in sodium chloride 0.9 % 100 mL IVPB, 2 g, Intravenous, Q12H, Smith, Rondell A, MD .  dexamethasone (DECADRON) injection 10 mg, 10 mg, Intravenous, Once, Smith, Rondell A, MD .  divalproex (DEPAKOTE SPRINKLE) capsule 125 mg, 125 mg, Oral, QHS, Smith, Rondell A, MD .   docusate sodium (COLACE) capsule 100 mg, 100 mg, Oral, Daily PRN, Katrinka Blazing, Rondell A, MD .  finasteride (PROSCAR) tablet 5 mg, 5 mg, Oral, Daily, Clydie Braun, MD, Stopped at 01/06/20 1200 .  gabapentin (NEURONTIN) capsule 300 mg, 300 mg, Oral, BID, Smith, Rondell A, MD .  guaiFENesin (MUCINEX) 12 hr tablet 600-1,200 mg, 600-1,200 mg, Oral, BID PRN, Smith, Rondell A, MD .  ipratropium-albuterol (DUONEB) 0.5-2.5 (3) MG/3ML nebulizer solution 3 mL, 3 mL, Nebulization, Q2H PRN, Smith, Rondell A, MD .  labetalol (NORMODYNE) injection 5 mg, 5 mg, Intravenous, Q2H PRN, Smith, Rondell A, MD .  LORazepam (ATIVAN) injection 0.5 mg, 0.5 mg, Intravenous, Q6H PRN, Katrinka Blazing, Rondell A, MD .  memantine (NAMENDA) tablet 10 mg, 10 mg, Oral, BID, Smith, Rondell A, MD .  metoprolol tartrate (LOPRESSOR) tablet 12.5 mg, 12.5 mg, Oral, BID, Smith, Rondell A, MD .  morphine 2 MG/ML injection 2 mg, 2 mg, Intravenous, Q2H PRN, Smith, Rondell A, MD .  sodium chloride flush (NS) 0.9 % injection 3 mL, 3 mL, Intravenous, Q12H, Smith, Rondell A, MD, 3 mL at 01/06/20 1200 .  triamcinolone cream (KENALOG) 0.1 % 1 application, 1 application, Topical, BID PRN, Madelyn Flavors A, MD .  Melene Muller ON 01/07/2020] vancomycin (VANCOREADY) IVPB 1250 mg/250 mL, 1,250 mg, Intravenous, Q24H, Ilda Basset, [place] .  vancomycin (VANCOREADY) IVPB 2000 mg/400 mL, 2,000 mg, Intravenous, Once, Garvin, Bunker D, RPH  ROS: Unable to obtain secondary mental status   Exam: Current vital signs: BP (!) 150/71 (BP Location: Right Leg)   Pulse 83   Temp 99.2 F (37.3 C) (Axillary)   Resp 18   Ht 5\' 7"  (1.702 m)   Wt 96.2 kg   SpO2 94%   BMI 33.20 kg/m  Vital signs in last 24 hours: Temp:  [98.7 F (37.1 C)-101.1 F (38.4 C)] 99.2 F (37.3 C) (03/02 1147) Pulse Rate:  [71-90] 83 (03/02 1147) Resp:  [15-25] 18 (03/02 1147) BP: (105-150)/(32-94) 150/71 (03/02 1147) SpO2:  [94 %-100 %] 94 % (03/02 1147) Weight:  [96.2 kg] 96.2 kg (03/02  1147)   Constitutional: Appears well-developed and well-nourished.  Eyes: No scleral injection HENT: No OP obstrucion Head: Normocephalic.  Cardiovascular: Normal rate and regular rhythm.  Respiratory: Effort normal, non-labored breathing GI: Soft.    Positive distention Skin: WDI  Neuro: Mental Status: Patient is in bed, eyes closed, follows no verbal commands, moaning with bilateral arm tremor Cranial Nerves: II: Attempte to assess however patient would hold eyes clenched tight III,IV, VI: Unable to assess secondary to patient clenching eyes tight V: Unable to obtain VII: Face appears symmetric VIII: hearing is intact to voice X: Palat elevates symmetrically XII: tongue is midline without atrophy or fasciculations.  Motor: Patient can localize to sternal rub, as noted above bilateral upper extremity tremor which is worse when agitated, increased  tone throughout. Significant nuchal rigidity which causes pain to patient when trying to passively move neck Sensory: Moans with any and all movement of extremities Deep Tendon Reflexes: Depressed throughout secondary to increased tone Plantars: Right toe upgoing left toe downgoing Cerebellar: Unable to assess  Labs I have reviewed labs in epic and the results pertinent to this consultation are:  CBC    Component Value Date/Time   WBC 9.6 01/06/2020 0411   RBC 4.45 01/06/2020 0411   HGB 13.9 01/06/2020 0411   HCT 42.0 01/06/2020 0411   PLT 132 (L) 01/06/2020 0411   MCV 94.4 01/06/2020 0411   MCH 31.2 01/06/2020 0411   MCHC 33.1 01/06/2020 0411   RDW 12.6 01/06/2020 0411   LYMPHSABS 1.6 01/05/2020 1208   MONOABS 0.8 01/05/2020 1208   EOSABS 0.1 01/05/2020 1208   BASOSABS 0.0 01/05/2020 1208    CMP     Component Value Date/Time   NA 139 01/05/2020 1618   K 4.2 01/05/2020 1618   CL 104 01/05/2020 1208   CO2 22 01/05/2020 1208   GLUCOSE 87 01/05/2020 1208   BUN 12 01/05/2020 1208   CREATININE 1.05 01/05/2020 1208    CALCIUM 8.6 (L) 01/05/2020 1208   PROT 5.9 (L) 01/05/2020 1208   ALBUMIN 3.3 (L) 01/05/2020 1208   AST 38 01/05/2020 1208   ALT 25 01/05/2020 1208   ALKPHOS 49 01/05/2020 1208   BILITOT 2.4 (H) 01/05/2020 1208   GFRNONAA >60 01/05/2020 1208   GFRAA >60 01/05/2020 1208    Lipid Panel     Component Value Date/Time   CHOL 126 01/14/2019 1134   TRIG 90.0 01/14/2019 1134   TRIG 137 11/01/2006 1602   HDL 57.60 01/14/2019 1134   CHOLHDL 2 01/14/2019 1134   VLDL 18.0 01/14/2019 1134   LDLCALC 51 01/14/2019 1134   LDLDIRECT 114.0 12/25/2017 1150     Imaging I have reviewed the images obtained:  CT-scan of the brain-no evidence of significant acute traumatic injury to the skull or brain.  Mild cerebral atrophy with chronic microvascular ischemic changes in the white matter and old left occipital lobe infarct similar to prior study.  MRI examination of the brain-ordered  Etta Quill PA-C [facility] [phone]  M-F  (9:00 am- 5:00 PM)  01/06/2020, 5:12 PM     Assessment:  This is a 74 year old male with known memory decline, presented to hospital with increased memory decline, decreased oral intake and medication intake.  In ER found to have elevated temperature and significant altered mental status along with nuchal rigidity.  Unfortunately at this time patient's INR is 3.5 as he is on chronic Coumadin for DVT and PE.  Currently he is being treated prophylactically for meningitis.  Exam does show nuchal rigidity, altered mental status with no significant source of infection at this time.  I do believe there is a concern for meningitis.  However lumbar puncture would be contraindicated at this time secondary to INR of 3.5.  At this time pharmacy is holding his Coumadin.  Once INR is less than 1.5, and if patient is not improving with prophylactic treatment will need LP.   Impression: Acute metabolic encephalopathy Possible meningitis  Recommendations: -As  Coumadin is being held due to it being supra therapeutic -Continue to hold Coumadin -Start acyclovir per pharmacy -Continue to treat meningitis per pharmacy -Agree with MRI brain   NEUROHOSPITALIST ADDENDUM Performed a face to face diagnostic evaluation.   I have reviewed the contents of history and physical  exam as documented by PA/ARNP/Resident and agree with above documentation.  I have discussed and formulated the above plan as documented. Edits to the note have been made as needed.  Patient with dementia, right PCA stroke, history of PE and DVT on Coumadin presents with 2 days progressive lethargy, noted to be febrile and have neck stiffness-neurology consulted for possible meningitis evaluation.  No other source of fever identified.  On assessment patient's neck does appear stiff, although at times he does shake his horizontally.  Patient is nonverbal, moans to pain.  Forcibly closes eyes when attempting to open.  Withdraws in all 4 extremities.   Agree with empiric coverage for meningitis, also ordered acyclovir-although less likely to be viral encephalitis as there was progressive decline.  The patient appears to have poor baseline due to dementia, there is question that he may have cystitis which could be responsible for fever and cause worsening of his already poor baseline.Marland Kitchen  Unfortunately cannot obtain an LP at this INR 3.5.  We will also order EEG.  SARS-CoV-2 negative.   Spoke to NP Craige Cotta regarding the fact patient is full code, given severe encephalopathy - we should have GOC discussion especially regarding DNR/DNI.     Georgiana Spinner Elexius Minar MD [facility] [phone]   If 7pm to 7am, please call on call as listed on AMION.

## 2020-01-06 NOTE — ED Provider Notes (Signed)
  Physical Exam  BP 128/82   Pulse 80   Temp 99.3 F (37.4 C) (Rectal)   Resp 20   SpO2 100%   Physical Exam  ED Course/Procedures   Clinical Course as of Jan 06 3  Mon Jan 05, 2020  1559 Patient with generalized weakness and wife inability to care for him at home x3 days. 2 recent falls in February. +decubitus ulcer. CM Wanda to see. PT/OT consult.    [KM]  2036 Discussed with Burna Mortimer from CM. Patient will be boarder until PT eval tomorrow morning. Care passed to oncoming PA Elpidio Anis due to change of shift. ED obs   [KM]    Clinical Course User Index [KM] Arlyn Dunning, PA-C    Procedures  MDM       Arlyn Dunning, PA-C 01/06/20 0004    Melene Plan, DO 01/06/20 [phone]

## 2020-01-06 NOTE — Progress Notes (Addendum)
Pharmacy Antibiotic Note  Logan Valdez. is a 74 y.o. male admitted on 01/05/2020 with worsening weakness and confusion over the last 3-4 days.  Pharmacy has been consulted for vancomycin and ampicillin dosing for meningitis; also on ceftriaxone. Physician notes neck rigidity. Patient is febrile with Tm 101.1, PCT < 0.1, WBC are normal. Renal function is normal. MRI brain pending.  Plan: Vancomycin 2000 mg IV load then 1250 mg IV q24h    Goal AUC 400-550.    Expected AUC: 496.9    SCr used: 1.05 Ampicillin 2 g IV q4h Ceftriaxone 2 g IV q12h per MD dosing Monitor renal function, clinical progress, C/S Vancomycin levels as clinically indicated   Height: 5\' 7"  (170.2 cm) Weight: 212 lb (96.2 kg) IBW/kg (Calculated) : 66.1  Temp (24hrs), Avg:99.7 F (37.6 C), Min:98.7 F (37.1 C), Max:101.1 F (38.4 C)  Recent Labs  Lab 01/05/20 1208 01/06/20 0411  WBC 7.5 9.6  CREATININE 1.05  --   LATICACIDVEN  --  1.3    Estimated Creatinine Clearance: 68.2 mL/min (by C-G formula based on SCr of 1.05 mg/dL).    Allergies  Allergen Reactions  . Hydrochlorothiazide Other (See Comments)    Mental changes   . Indomethacin     syncope  . Pravastatin Sodium     REACTION: rash 11/09  . Exelon [Rivastigmine Tartrate] Other (See Comments)    Had watery mucousy diarrhea, and skin reaction.  . Lisinopril     REACTION: HA  . Micardis [Telmisartan]     REACTION: HA    Antimicrobials this admission: vancomycin 3/2 >>  ampicillin 3/2 >>  ceftriaxone 3/2 >>  Dose adjustments this admission:   Microbiology results: 3/2 BCx: ngtd 3/2 Resp PCR panel: neg 3/2 COVID neg 3/1 UCx: pending   Thank you for involving pharmacy in this patient's care.  13/09, PharmD, BCPS Clinical Pharmacist Clinical phone for 01/06/2020 until 9p  01/06/2020 4:36 PM  **Pharmacist phone directory can be found on [email or web address] listed under [facility]**  Addendum: Consulted to add acyclovir to  regimen. BMI >30 so will use ABW.  Acyclovir 780 mg IV q8h  [facility], PharmD, BCPS 8:01 PM

## 2020-01-06 NOTE — ED Provider Notes (Signed)
4:21 AM Notified by RN that patient is febrile.   Hx of Parkinson's.  Wife states slightly more confused than normal.  Patient complaining of back pain.    CT shows findings concerning for cystitis.  Patient has been incontinent of urine.  He is also febrile.  No other source for infection identified.  COVID pending.  Will consult medicine for admission.  Discussed with Dr. Nicanor Alcon, who agrees with the plan.   Roxy Horseman, PA-C 01/06/20 0086    Nicanor Alcon, April, MD 01/06/20 [phone]

## 2020-01-06 NOTE — Evaluation (Signed)
Physical Therapy Evaluation Patient Details Name: Logan Valdez. MRN: [id number] DOB: [birth year] Today's Date: 01/06/2020   History of Present Illness  74 yo male admitted to ED on 3/1 with decreased intake, weakness, recent falls. CT head negative for acute changes, but shows microvascular ischemic changes. PMH includes vascular dementia, PD, HTN, DVT/PE, DDD, CVA, dysmetabolic syndrome, PFO.  Clinical Impression   Pt presents with significant weakness, impaired cognition vs baseline (nonconversant and limited command following), difficulty performing bed mobility tasks, inability to transfer OOB, and decreased activity tolerance. Pt to benefit from acute PT to address deficits. Pt required total assist for attempted roll to L, could not tolerate further mobility due to pain vocalizations. Pt also unable to tolerate UE and LE ROM without significant pain vocalizations. PT recommending SNF level of care to maximize pt mobility and function. Pt's wife at bedside in full agreement, as she has been providing around the clock care for pt up to this point.  PT to progress mobility as tolerated, and will continue to follow acutely.      Follow Up Recommendations SNF;Supervision/Assistance - 24 hour    Equipment Recommendations  Other (comment)(defer)    Recommendations for Other Services       Precautions / Restrictions Precautions Precautions: Fall Restrictions Weight Bearing Restrictions: No      Mobility  Bed Mobility Overal bed mobility: Needs Assistance Bed Mobility: Rolling Rolling: Total assist         General bed mobility comments: Semi-roll to L with total assist and 0% pt engagement, pt limited by weakness and vocalizations of pain.  Transfers                 General transfer comment: unable  Ambulation/Gait             General Gait Details: unable  Stairs            Wheelchair Mobility    Modified Rankin (Stroke Patients Only)        Balance Overall balance assessment: Needs assistance;History of Falls(pt's wife and niece report 5 falls in the past month)     Sitting balance - Comments: unable to assess       Standing balance comment: unable to assess                             Pertinent Vitals/Pain Pain Assessment: Faces Faces Pain Scale: Hurts whole lot Pain Location: generalized, during ROM Pain Descriptors / Indicators: Discomfort;Grimacing;Moaning Pain Intervention(s): Limited activity within patient's tolerance;Monitored during session;Repositioned    Home Living Family/patient expects to be discharged to:: Private residence Living Arrangements: Spouse/significant other Available Help at Discharge: Family;Available 24 hours/day Type of Home: House Home Access: Stairs to enter   [employer] of Steps: 2 Home Layout: One level Home Equipment: Walker - 2 wheels;Walker - 4 wheels      Prior Function Level of Independence: Needs assistance   Gait / Transfers Assistance Needed: pt's wife states pt was using RW PTA, but not over the past couple of days. Pt has been using lift chair to sleep over the past 3 months. Pt's niece assists PRN, as do other family members.  ADL's / Homemaking Assistance Needed: Total assist for dressing, bathing, eating, toileting        Hand Dominance   Dominant Hand: Right    Extremity/Trunk Assessment   Upper Extremity Assessment Upper Extremity Assessment: Difficult to assess due to impaired cognition  Lower Extremity Assessment Lower Extremity Assessment: Difficult to assess due to impaired cognition;RLE deficits/detail;LLE deficits/detail RLE Deficits / Details: AAROM heel slide to ~50* knee flexion, empty end feel secondary to pain RLE Sensation: history of peripheral neuropathy LLE Deficits / Details: AAROM heel slide to ~45* knee flexion, empty end feel secondary to pain LLE Sensation: history of peripheral neuropathy     Cervical / Trunk Assessment Cervical / Trunk Assessment: Other exceptions Cervical / Trunk Exceptions: unable to assess, limited mobility  Communication   Communication: HOH  Cognition Arousal/Alertness: Lethargic Behavior During Therapy: Restless;Flat affect Overall Cognitive Status: History of cognitive impairments - at baseline                                 General Comments: History of dementia, follows 1 step commands with very increased time and inconsistently. Pt with decreased arousal at rest, crying out in pain with any movement.      General Comments General comments (skin integrity, edema, etc.): decubitus ulcer, unable to visualize and assess due to pt limited tolerance to rolling.    Exercises     Assessment/Plan    PT Assessment Patient needs continued PT services  PT Problem List Decreased strength;Decreased mobility;Decreased safety awareness;Decreased activity tolerance;Decreased balance;Decreased knowledge of use of DME;Pain;Decreased range of motion;Obesity       PT Treatment Interventions DME instruction;Therapeutic activities;Gait training;Therapeutic exercise;Patient/family education;Balance training;Neuromuscular re-education;Functional mobility training    PT Goals (Current goals can be found in the Care Plan section)  Acute Rehab PT Goals Patient Stated Goal: get to rehab PT Goal Formulation: With patient/family Time For Goal Achievement: 01/20/20 Potential to Achieve Goals: Good    Frequency Min 2X/week   Barriers to discharge        Co-evaluation               AM-PAC PT "6 Clicks" Mobility  Outcome Measure Help needed turning from your back to your side while in a flat bed without using bedrails?: Total Help needed moving from lying on your back to sitting on the side of a flat bed without using bedrails?: Total Help needed moving to and from a bed to a chair (including a wheelchair)?: Total Help needed standing up  from a chair using your arms (e.g., wheelchair or bedside chair)?: Total Help needed to walk in hospital room?: Total Help needed climbing 3-5 steps with a railing? : Total 6 Click Score: 6    End of Session   Activity Tolerance: Patient limited by fatigue;Patient limited by pain Patient left: in bed;with call bell/phone within reach;with nursing/sitter in room Nurse Communication: Mobility status PT Visit Diagnosis: Muscle weakness (generalized) (M62.81);Repeated falls (R29.6)    Time: 9379-0240 PT Time Calculation (min) (ACUTE ONLY): 19 min   Charges:   PT Evaluation $PT Eval Low Complexity: 1 Low         Kathalina Ostermann E, PT Acute Rehabilitation Services Pager [phone]  Office [phone]   Madison Direnzo D Despina Hidden 01/06/2020, 10:49 AM

## 2020-01-06 NOTE — Telephone Encounter (Signed)
Marcelino Duster register call to cancel todays apt due to patient currently being hospitalized and would like a CB to discuss whats going on with the patient   Please FU [phone]

## 2020-01-06 NOTE — TOC Initial Note (Signed)
Transition of Care (TOC) - Initial/Assessment Note    Patient Details  Name: Logan Valdez. MRN: [id number] Date of Birth: 1946/10/03  Transition of Care [facility]) CM/SW Contact:    Alexander Mt, LCSW Phone Number: 01/06/2020, 3:05 PM  Clinical Narrative:                 CSW spoke with pt spouse [name] via telephone, able to reach her at [phone]. Re-introduced self, role, reason for call. Pt wife confirms pt from home, confirmed home address and PCP Dr. Danise Mina. Pt has multiple pieces of DME at home including a rollator and lift chair. Pt has had a decline in function over the past few weeks where pt wife states she has had to provide more physical assistance than is his baseline. Pt wife states she wishes he could come straight home but does think that if he could get stronger that it would help her greatly as a caregiver.   Pt wife [facility]. Will send referrals and f/u in morning. Provided pt wife with my contact information. Pt wife let this writer know that she is getting her second COVID vaccine and pt was scheduled to get his tomorrow too, encouraged her to contact pt PCP about rescheduling it and/or asking at the vaccination clinic tomorrow.   Expected Discharge Plan: Skilled Nursing Facility Barriers to Discharge: Insurance Authorization, Continued Medical Work up   Patient Goals and CMS Choice Patient states their goals for this hospitalization and ongoing recovery are:: for him to get stronger so he can return home CMS Medicare.gov Compare Post Acute Care list provided to:: Patient Represenative (must comment)(pt wife [name]) Choice offered to / list presented to : Spouse  Expected Discharge Plan and Services Expected Discharge Plan: Skilled Nursing Facility In-house Referral: Clinical Social Work Discharge Planning Services: CM Consult Post Acute Care Choice: Museum/gallery conservator, [place] Living arrangements  for the past 2 months: Single Family Home  Prior Living Arrangements/Services Living arrangements for the past 2 months: Single Family Home Lives with:: Spouse Patient language and need for interpreter reviewed:: Yes(no needs) Do you feel safe going back to the place where you live?: Yes      Need for Family Participation in Patient Care: Yes (Comment)(assistance with daily cares) Care giver support system in place?: Yes (comment)(spouse; adult children) Current home services: DME Criminal Activity/Legal Involvement Pertinent to Current Situation/Hospitalization: No - Comment as needed  Activities of Daily Living Home Assistive Devices/Equipment: Environmental consultant (specify type) ADL Screening (condition at time of admission) Patient's cognitive ability adequate to safely complete daily activities?: No Is the patient deaf or have difficulty hearing?: No Does the patient have difficulty seeing, even when wearing glasses/contacts?: No Does the patient have difficulty concentrating, remembering, or making decisions?: Yes Patient able to express need for assistance with ADLs?: Yes Does the patient have difficulty dressing or bathing?: Yes Independently performs ADLs?: No Communication: Needs assistance Is this a change from baseline?: Pre-admission baseline Dressing (OT): Needs assistance Is this a change from baseline?: Pre-admission baseline Grooming: Needs assistance Is this a change from baseline?: Pre-admission baseline Feeding: Needs assistance Is this a change from baseline?: Pre-admission baseline Bathing: Needs assistance Is this a change from baseline?: Pre-admission baseline Toileting: Needs assistance Is this a change from baseline?: Pre-admission baseline In/Out Bed: Needs assistance Is this a change from baseline?: Pre-admission baseline Walks in Home: Needs assistance Is this a change from baseline?: Pre-admission baseline Does the patient have  difficulty walking or climbing  stairs?: Yes Weakness of Legs: Both Weakness of Arms/Hands: Both  Permission Sought/Granted Permission sought to share information with : Family Supports, Oceanographer granted to share information with : No(pt with fluctuating orientation)  Share Information with NAME: Mateus Rewerts  Permission granted to share info w AGENCY: SNFs  Permission granted to share info w Relationship: wife  Permission granted to share info w Contact Information: [phone] or [phone]  Emotional Assessment Appearance:: Appears stated age Attitude/Demeanor/Rapport: Other (comment)(assessment complete with pt spouse) Affect (typically observed): Other (comment)(assessment complete with pt spouse) Orientation: : Fluctuating Orientation (Suspected and/or reported Sundowners) Alcohol / Substance Use: Not Applicable Psych Involvement: (n/a at this time)  Admission diagnosis:  Back pain [M54.9] Acute encephalopathy [G93.40] Generalized weakness [R53.1] Cystitis [N30.90] Fever, unspecified fever cause [R50.9] Patient Active Problem List   Diagnosis Date Noted  . Acute encephalopathy 01/06/2020  . Xerosis of skin 10/06/2019  . Balanitis 09/19/2018  . Scrotal swelling 09/19/2018  . Daytime somnolence 09/19/2018  . Agitation 05/31/2018  . Nocturia 05/01/2018  . Health maintenance examination 01/07/2018  . Long term (current) use of anticoagulants 11/15/2017  . Encounter for long-term (current) use of antiplatelets/antithrombotics 10/18/2017  . COPD (chronic obstructive pulmonary disease) (HCC) 03/05/2017  . Poor urinary stream 05/06/2016  . Urge incontinence 05/06/2016  . History of stroke 01/12/2016  . Chronic back pain 09/07/2015  . Leg cramps 06/22/2015  . Drug-induced constipation with proper administration 04/12/2015  . 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 Heme positive stool 01/13/2015  . Medicare annual wellness visit, subsequent 12/22/2014  .  Advanced care planning/counseling discussion 12/22/2014  . B12 deficiency 12/13/2014  . Vascular dementia (HCC) 12/09/2014  . Encounter for therapeutic drug monitoring 12/17/2013  . 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 HTN (hypertension) 09/18/2012  . PFO (patent foramen ovale) 04/08/2009  . OSA (obstructive sleep apnea) 04/08/2009  . COMPRESSION FRACTURE, L4 VERTEBRA 11/04/2008  . Diabetes mellitus type 2, controlled, with complications (HCC) 05/14/2008  . Primary hypercoagulable state (HCC) 05/14/2008  . Lumbar radiculopathy 05/14/2008  . Gout 12/09/2007  . METABOLIC SYNDROME X 12/09/2007  . Severe obesity (BMI 35.0-39.9) with comorbidity (HCC) 12/09/2007  . PULMONARY EMBOLISM, HX OF 12/09/2007  . DEEP VENOUS THROMBOPHLEBITIS, HX OF 12/09/2007  . HYPERLIPIDEMIA 12/05/2007   PCP:  Eustaquio Boyden, MD Pharmacy:   [facility] [address], [place] - [address] [place] [address] Phone: [phone] Fax: [phone]    Readmission Risk Interventions Readmission Risk Prevention Plan 01/06/2020  Post Dischage Appt Complete  Medication Screening Complete  Transportation Screening Complete  Some recent data might be hidden

## 2020-01-06 NOTE — Telephone Encounter (Signed)
Was going to call after lunch in hopes to r/s. Seems someone already took him off the schedule.

## 2020-01-06 NOTE — Progress Notes (Addendum)
Went to see pt in response to call from Neuro stating patient remains obtunded and he recommends DNR.  Spoke to wife at length regarding DNR and palliative/comfort care options.  Placed stat palliative and chaplain consults.  PO meds discontinued.  Pt obtunded.  Pupils 43mm and sluggish, withdraws only to noxious stimuli, does not follow commands.   Neck stiff and tilted to the right.  Wife states he sits and sleeps in his chair in that position and that this is not a new finding.

## 2020-01-06 NOTE — Telephone Encounter (Signed)
Pt's wife, [name], called to let office know pt is currently in the hospital. Inquired if [name] needed anything and she said she has support from family. Advised for pt to f/u after d/c and if she needed anything to contact this office. [name] verbalized understanding.

## 2020-01-06 NOTE — NC FL2 (Signed)
[place] LEVEL OF CARE SCREENING TOOL     IDENTIFICATION  Patient Name: Logan Valdez. Birthdate: [birth year] Sex: male Admission Date (Current Location): 01/05/2020  [facility] and [place] Number:  Herbalist and Address:  The . [facility], [place] [address] [place], [place] [address]      Provider Number: [id number]  Attending Physician Name and Address:  Norval Morton, MD  Relative Name and Phone Number:       Current Level of Care: Hospital Recommended Level of Care: [place] Prior Approval Number:    Date Approved/Denied:   PASRR Number: [id number]  Discharge Plan: SNF    Current Diagnoses: Patient Active Problem List   Diagnosis Date Noted  . Acute encephalopathy 01/06/2020  . Xerosis of skin 10/06/2019  . Balanitis 09/19/2018  . Scrotal swelling 09/19/2018  . Daytime somnolence 09/19/2018  . Agitation 05/31/2018  . Nocturia 05/01/2018  . Health maintenance examination 01/07/2018  . Long term (current) use of anticoagulants 11/15/2017  . Encounter for long-term (current) use of antiplatelets/antithrombotics 10/18/2017  . COPD (chronic obstructive pulmonary disease) ([place]) 03/05/2017  . Poor urinary stream 05/06/2016  . Urge incontinence 05/06/2016  . History of stroke 01/12/2016  . Chronic back pain 09/07/2015  . Leg cramps 06/22/2015  . Drug-induced constipation with proper administration 04/12/2015  . 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 Heme positive stool 01/13/2015  . Medicare annual wellness visit, subsequent 12/22/2014  . Advanced care planning/counseling discussion 12/22/2014  . B12 deficiency 12/13/2014  . Vascular dementia ([place]) 12/09/2014  . Encounter for therapeutic drug monitoring 12/17/2013  . 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 HTN (hypertension) 09/18/2012  . PFO (patent foramen ovale) 04/08/2009  . OSA  (obstructive sleep apnea) 04/08/2009  . COMPRESSION FRACTURE, L4 VERTEBRA 11/04/2008  . Diabetes mellitus type 2, controlled, with complications ([place]) 24/58/0998  . Primary hypercoagulable state ([place]) 05/14/2008  . Lumbar radiculopathy 05/14/2008  . Gout 12/09/2007  . METABOLIC SYNDROME X 33/82/5053  . Severe obesity (BMI 35.0-39.9) with comorbidity ([place]) 12/09/2007  . PULMONARY EMBOLISM, HX OF 12/09/2007  . DEEP VENOUS THROMBOPHLEBITIS, HX OF 12/09/2007  . HYPERLIPIDEMIA 12/05/2007    Orientation RESPIRATION BLADDER Height & Weight     Self  Normal Incontinent, External catheter Weight: 212 lb (96.2 kg) Height:  5\' 7"  (170.2 cm)  BEHAVIORAL SYMPTOMS/MOOD NEUROLOGICAL BOWEL NUTRITION STATUS      Continent Diet(see discharge summary)  AMBULATORY STATUS COMMUNICATION OF NEEDS Skin   Extensive Assist Verbally Other (Comment)(generalized ecchymosis; MASD on buttocks)                       Personal Care Assistance Level of Assistance  Bathing, Feeding, Dressing Bathing Assistance: Maximum assistance Feeding assistance: Limited assistance Dressing Assistance: Maximum assistance     Functional Limitations Info  Sight, Speech, Hearing Sight Info: Adequate Hearing Info: Adequate Speech Info: Adequate    SPECIAL CARE FACTORS FREQUENCY  OT (By licensed OT), PT (By licensed PT)     PT Frequency: 5x week OT Frequency: 5x week            Contractures Contractures Info: Not present    Additional Factors Info  Code Status, Allergies, Psychotropic Code Status Info: Full Code Allergies Info: Hydrochlorothiazide, Indomethacin, Pravastatin Sodium, Exelon (Rivastigmine Tartrate), Lisinopril, Micardis (Telmisartan) Psychotropic Info: memantine (NAMENDA) tablet 10 mg 2x daily PO  Current Medications (01/06/2020):  This is the current hospital active medication list Current Facility-Administered Medications  Medication Dose Route Frequency Provider Last Rate Last Admin   . amLODipine (NORVASC) tablet 2.5 mg  2.5 mg Oral Daily Smith, Rondell A, MD      . atorvastatin (LIPITOR) tablet 40 mg  40 mg Oral Daily Katrinka Blazing, Rondell A, MD      . Melene Muller ON 01/07/2020] cefTRIAXone (ROCEPHIN) 1 g in sodium chloride 0.9 % 100 mL IVPB  1 g Intravenous Q24H Smith, Rondell A, MD      . divalproex (DEPAKOTE SPRINKLE) capsule 125 mg  125 mg Oral QHS Smith, Rondell A, MD      . docusate sodium (COLACE) capsule 100 mg  100 mg Oral Daily PRN Katrinka Blazing, Rondell A, MD      . finasteride (PROSCAR) tablet 5 mg  5 mg Oral Daily Smith, Rondell A, MD      . gabapentin (NEURONTIN) capsule 300 mg  300 mg Oral BID Smith, Rondell A, MD      . guaiFENesin (MUCINEX) 12 hr tablet 600-1,200 mg  600-1,200 mg Oral BID PRN Smith, Rondell A, MD      . ipratropium-albuterol (DUONEB) 0.5-2.5 (3) MG/3ML nebulizer solution 3 mL  3 mL Nebulization Q2H PRN Smith, Rondell A, MD      . LORazepam (ATIVAN) injection 0.5 mg  0.5 mg Intravenous Q2H PRN Madelyn Flavors A, MD   0.5 mg at 01/05/20 2308  . memantine (NAMENDA) tablet 10 mg  10 mg Oral BID Smith, Rondell A, MD      . metoprolol tartrate (LOPRESSOR) tablet 12.5 mg  12.5 mg Oral BID Smith, Rondell A, MD      . sodium chloride flush (NS) 0.9 % injection 3 mL  3 mL Intravenous Q12H Smith, Rondell A, MD      . triamcinolone cream (KENALOG) 0.1 % 1 application  1 application Topical BID PRN Clydie Braun, MD         Discharge Medications: Please see discharge summary for a list of discharge medications.  Relevant Imaging Results:  Relevant Lab Results:   Additional Information SS# [id number] [place], [place]

## 2020-01-06 NOTE — Progress Notes (Signed)
MD notified pt still has no urine output. Previous bladder scan results recorded. Awaiting orders.

## 2020-01-06 NOTE — H&P (Addendum)
History and Physical    Logan Logan. HBZ:[id number] DOB: October 27, 1946 DOA: 01/05/2020  Referring MD/NP/PA: Shela Leff, MD PCP: Ria Bush, MD  Patient coming from: home   Chief Complaint: Weakness and confusion  I have personally briefly reviewed patient's old medical records in [place]   HPI: Logan Logan. is a 74 y.o. 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  History is obtained from the patient's wife over the phone as the patient is unable to give any history at this point in time.  At baseline she reports that he had been able to walk, but had not been able to recently.  He complained of having back pain with a burning sensation running down his leg.  He had been more confused and reportedly talking out of his head.  She reports that he had had 2 falls in the earlier parts of February, but none since then.  Furthermore, the patient has been sleeping more and not eating or taking any of his meds.  No recent sick contacts or fever at home to her knowledge.  Due to worsening of  his symptoms he was brought to the emergency department.  In his current state she reports being unable to care for him at home.  ED Course: On admission into the emergency department patient was noted to be febrile up to 101.1 F, respiration 15-25, and pressure elevated up to 171/137, and all other vital signs maintained.  Labs significant for platelets 116->132, lactic acid 1.3, INR 3.5, total bilirubin 2.4, and ammonia level 17.  Urinalysis was positive for small hemoglobin, 20 ketones, 11-20 RBCs, no bacteria seen.  Chest x-ray was clear and CT scan of the head did not note any acute abnormalities.  CT scan of the lumbar spine anterolithiasis of L5 on S1 with chronic  bilateral pars defects, progression of anterior superior compression deformities of L1 and L4 vertebral bodies, and lumbar spine spondylosis L5-S1 with moderate neural foraminal narrowing.  Subsequently, CT scan of the abdomen and pelvis revealed bladder wall thickening concerning for possible cystitis.  Patient had initially been started on empiric antibiotics of Rocephin and given Tylenol for fever.  Review of Systems  Unable to perform ROS: Mental status change    Past Medical History:  Diagnosis Date  . Chronic lower back pain    [place]  . DDD (degenerative disc disease), lumbar   . Dysmetabolic syndrome X   . Gout, unspecified   . History of DVT (deep vein thrombosis)   . History of pulmonary embolism   . History of stroke 2009   [place]  . History of thrombophlebitis   . HTN (hypertension)   . Memory loss   . Mixed hyperlipidemia   . Obesity   . PFO (patent foramen ovale)   . Primary hypercoagulable state ([place]) 05/14/2008   Personal h/o DVT, PE, PFO, and lupus AC +   . Stage I pressure ulcer of left buttock 04/25/2019  . Stroke ([place])   . Type II or unspecified type diabetes mellitus without mention of complication, uncontrolled   . Unspecified sleep apnea     Past Surgical History:  Procedure Laterality Date  . COLONOSCOPY  2000   [facility]  . WRIST SURGERY     Right wrist repair post trauma  reports that he has never smoked. He has never used smokeless tobacco. He reports that he does not drink alcohol or use drugs.  Allergies  Allergen Reactions  . Hydrochlorothiazide Other (See Comments)    Mental changes   . Indomethacin     syncope  . Pravastatin Sodium     REACTION: rash 11/09  . Exelon [Rivastigmine Tartrate] Other (See Comments)    Had watery mucousy diarrhea, and skin reaction.  . Lisinopril     REACTION: HA  . Micardis [Telmisartan]     REACTION: HA    Family History  Problem Relation Age of Onset  . Emphysema Father   . Diabetes Brother   .  Hypertension Brother   . Heart attack Brother 37  . Skin cancer Brother   . Lung cancer Brother   . Cancer Maternal Aunt        ? Type   . Stroke Neg Hx     Prior to Admission medications   Medication Sig Start Date End Date Taking? Authorizing Provider  acetaminophen (TYLENOL) 500 MG tablet Take 500 mg by mouth 3 (three) times daily.   Yes [provider]  amLODipine (NORVASC) 2.5 MG tablet Take 1 tablet by mouth once daily Patient taking differently: Take 2.5 mg by mouth daily.  07/04/19  Yes Ria Bush, MD  atorvastatin (LIPITOR) 40 MG tablet Take 1 tablet (40 mg total) by mouth daily. 06/19/19  Yes Ria Bush, MD  warfarin (COUMADIN) 5 MG tablet TAKE AS DIRECTED BY  COUMADIN  CLINIC Patient taking differently: Take 2.5-5 mg by mouth See admin instructions. TAKE AS DIRECTED BY COUMADIN CLINIC; 5 mg (5 mg x 1) every Mon, Thu; 2.5 mg (5 mg x 0.5) all other days 07/04/19  Yes Ria Bush, MD  bacitracin ointment Apply 1 application topically 2 (two) times daily. 11/25/19   Varney Biles, MD  clotrimazole (LOTRIMIN) 1 % cream Apply 1 application topically 2 (two) times daily. 09/19/18   Ria Bush, MD  cyanocobalamin 500 MCG tablet Take 500 mcg by mouth daily. Vitamin B12    [provider]  dextromethorphan (DELSYM) 30 MG/5ML liquid Take 15-60 mg by mouth 2 (two) times daily as needed for cough.    [provider]  divalproex (DEPAKOTE SPRINKLE) 125 MG capsule Take 1 capsule by mouth at bedtime 12/08/19   Frann Rider, NP  docusate sodium (COLACE) 100 MG capsule Take 100 mg by mouth daily as needed for mild constipation.    [provider]  finasteride (PROSCAR) 5 MG tablet Take 1 tablet by mouth once daily 07/04/19   Ria Bush, MD  FOLIC ACID PO Take 3 tablets by mouth 2 (two) times daily.    [provider]  gabapentin (NEURONTIN) 300 MG capsule Take 1 capsule (300 mg total) by mouth 2 (two) times daily. 12/12/19    Ria Bush, MD  guaiFENesin (MUCINEX) 600 MG 12 hr tablet Take 600-1,200 mg by mouth 2 (two) times daily as needed for cough or to loosen phlegm.    [provider]  Hydroactive Dressings (TEGADERM HYDROCOLLOID) MISC Apply 1 each topically once a week. 06/30/19   Ria Bush, MD  ipratropium-albuterol (DUONEB) 0.5-2.5 (3) MG/3ML SOLN Take 3 mLs by nebulization every 2 (two) hours as needed. 02/19/17   Theodis Blaze, MD  LORazepam (ATIVAN) 0.5 MG tablet Take 0.5-1 tablets (0.25-0.5 mg total) by mouth daily as needed for anxiety. 09/19/18   Ria Bush, MD  memantine (NAMENDA) 10 MG tablet  Take 1 tablet (10 mg total) by mouth 2 (two) times daily. 12/15/19   Frann Rider, NP  metoprolol tartrate (LOPRESSOR) 25 MG tablet Take 1/2 (one-half) tablet by mouth twice daily 11/14/19   Ria Bush, MD  polyethylene glycol powder (GLYCOLAX/MIRALAX) powder DISSOLVE 1 CAPFUL (17GM) INTO 4 TO 8 OUNCES OF FLUID AND TAKE BY MOUTH ONCE DAILY AS DIRECTED 05/01/18   Ria Bush, MD  Pyridoxine HCl (VITAMIN B-6 PO) Take 1 tablet by mouth 2 (two) times daily.    [provider]  triamcinolone cream (KENALOG) 0.1 % Apply 1 application topically 2 (two) times daily as needed (dry skin with rash). Apply to [place]. 10/06/19   Ria Bush, MD  metoprolol succinate (TOPROL-XL) 100 MG 24 hr tablet 1/2 by mouth daily 12/12/12 02/12/13  Hendricks Limes, MD    Physical Exam:  Constitutional: Elderly male who is lethargic and does not really respond to verbal commands, but withdraws to noxious stimuli Vitals:   01/06/20 0530 01/06/20 0623 01/06/20 0700 01/06/20 0802  BP: (!) 133/54 122/64 139/69 105/71  Pulse: 90 86 79 84  Resp: 20 20  (!) 25  Temp: 99.6 F (37.6 C)     TempSrc:      SpO2: 100% 95% 95% 97%   Eyes: PERRL, lids and conjunctivae normal ENMT: Mucous membranes are dry. Posterior pharynx clear of any exudate or lesions.  Neck: Neck stiffness  appreciated. Respiratory: clear to auscultation bilaterally, no wheezing, no crackles. Normal respiratory effort. No accessory muscle use.  Cardiovascular: Regular rate and rhythm, no murmurs / rubs / gallops. No extremity edema. 2+ pedal pulses. No carotid bruits.  Abdomen: no tenderness, no masses palpated. No hepatosplenomegaly. Bowel sounds positive.  Musculoskeletal: no clubbing / cyanosis. No joint deformity upper and lower extremities. Good ROM, no contractures. Normal muscle tone.  Skin: decubitus ulcer of the sacrum Neurologic: CN 2-12 grossly intact.  Psychiatric: Unable to assess.   Labs on Admission: I have personally reviewed following labs and imaging studies  CBC: Recent Labs  Lab 01/05/20 1208 01/05/20 1618 01/06/20 0411  WBC 7.5  --  9.6  NEUTROABS 4.9  --   --   HGB 13.2 12.9* 13.9  HCT 41.3 38.0* 42.0  MCV 96.5  --  94.4  PLT 116*  --  539*   Basic Metabolic Panel: Recent Labs  Lab 01/05/20 1208 01/05/20 1618  NA 137 139  K 4.6 4.2  CL 104  --   CO2 22  --   GLUCOSE 87  --   BUN 12  --   CREATININE 1.05  --   CALCIUM 8.6*  --    GFR: CrCl cannot be calculated (Unknown ideal weight.). Liver Function Tests: Recent Labs  Lab 01/05/20 1208  AST 38  ALT 25  ALKPHOS 49  BILITOT 2.4*  PROT 5.9*  ALBUMIN 3.3*   No results for input(s): LIPASE, AMYLASE in the last 168 hours. Recent Labs  Lab 01/05/20 1613  AMMONIA 17   Coagulation Profile: Recent Labs  Lab 01/04/20 0000  INR 3.5*   Cardiac Enzymes: No results for input(s): CKTOTAL, CKMB, CKMBINDEX, TROPONINI in the last 168 hours. BNP (last 3 results) No results for input(s): PROBNP in the last 8760 hours. HbA1C: No results for input(s): HGBA1C in the last 72 hours. CBG: Recent Labs  Lab 01/05/20 1206  GLUCAP 75   Lipid Profile: No results for input(s): CHOL, HDL, LDLCALC, TRIG, CHOLHDL, LDLDIRECT in the last 72 hours. Thyroid Function Tests: No  results for input(s): TSH,  T4TOTAL, FREET4, T3FREE, THYROIDAB in the last 72 hours. Anemia Panel: No results for input(s): VITAMINB12, FOLATE, FERRITIN, TIBC, IRON, RETICCTPCT in the last 72 hours. Urine analysis:    Component Value Date/Time   COLORURINE YELLOW 01/05/2020 1719   APPEARANCEUR CLEAR 01/05/2020 1719   LABSPEC 1.016 01/05/2020 1719   PHURINE 5.0 01/05/2020 1719   GLUCOSEU NEGATIVE 01/05/2020 1719   HGBUR SMALL (A) 01/05/2020 1719   BILIRUBINUR NEGATIVE 01/05/2020 1719   BILIRUBINUR negative 09/19/2018 1250   KETONESUR 20 (A) 01/05/2020 1719   PROTEINUR NEGATIVE 01/05/2020 1719   UROBILINOGEN 0.2 09/19/2018 1250   UROBILINOGEN 0.2 08/31/2015 1405   NITRITE NEGATIVE 01/05/2020 1719   LEUKOCYTESUR NEGATIVE 01/05/2020 1719   Sepsis Labs: No results found for this or any previous visit (from the past 240 hour(s)).   Radiological Exams on Admission: DG Chest 2 View  Result Date: 01/05/2020 CLINICAL DATA:  Altered mental status, weakness EXAM: CHEST - 2 VIEW COMPARISON:  02/16/2017 FINDINGS: The heart size and mediastinal contours are within normal limits. Both lungs are clear. Disc degenerative disease of the thoracic spine. IMPRESSION: No acute abnormality of the lungs. Electronically Signed   By: Eddie Candle M.D.   On: 01/05/2020 12:33   CT Head Wo Contrast  Result Date: 01/05/2020 CLINICAL DATA:  74 year old male with history of head trauma presenting with headache. EXAM: CT HEAD WITHOUT CONTRAST TECHNIQUE: Contiguous axial images were obtained from the base of the skull through the vertex without intravenous contrast. COMPARISON:  Head CT 11/24/2019. FINDINGS: Brain: Mild cerebral atrophy. Patchy and confluent areas of decreased attenuation are noted throughout the deep and periventricular white matter of the cerebral hemispheres bilaterally, compatible with chronic microvascular ischemic disease. More well-defined area of low attenuation in the occipital lobe white matter compatible with an area of  encephalomalacia/gliosis related to old left occipital infarct. No evidence of acute infarction, hemorrhage, hydrocephalus, extra-axial collection or mass lesion/mass effect. Vascular: No hyperdense vessel or unexpected calcification. Skull: Normal. Negative for fracture or focal lesion. Sinuses/Orbits: No acute finding. Other: None. IMPRESSION: 1. No evidence of significant acute traumatic injury to the skull or brain. 2. Mild cerebral atrophy with chronic microvascular ischemic changes in the cerebral white matter and old left occipital lobe infarct, similar to the prior study, as above. Electronically Signed   By: Vinnie Langton M.D.   On: 01/05/2020 14:47   CT ABDOMEN PELVIS W CONTRAST  Result Date: 01/06/2020 CLINICAL DATA:  Back pain and urinary incontinence EXAM: CT ABDOMEN AND PELVIS WITH CONTRAST TECHNIQUE: Multidetector CT imaging of the abdomen and pelvis was performed using the standard protocol following bolus administration of intravenous contrast. CONTRAST:  120m OMNIPAQUE IOHEXOL 300 MG/ML  SOLN COMPARISON:  None. FINDINGS: Lower chest: The visualized heart size within normal limits. No pericardial fluid/thickening. No hiatal hernia. The visualized portions of the lungs are clear. Hepatobiliary: The liver is normal in density without focal abnormality.The main portal vein is patent. No evidence of calcified gallstones, gallbladder wall thickening or biliary dilatation. Pancreas: Unremarkable. No pancreatic ductal dilatation or surrounding inflammatory changes. Spleen: Normal in size without focal abnormality. Adrenals/Urinary Tract: Both adrenal glands appear normal. The kidneys and collecting system appear normal without evidence of urinary tract calculus or hydronephrosis. There is slight hyperenhancement of the bladder wall. Stomach/Bowel: The stomach, small bowel, and colon are normal in appearance. A moderate amount of stool seen within the rectum. No inflammatory changes, wall thickening,  or obstructive findings.The appendix is normal.  Vascular/Lymphatic: There are no enlarged mesenteric, retroperitoneal, or pelvic lymph nodes. Scattered aortic atherosclerotic calcifications are seen without aneurysmal dilatation. Reproductive: The prostate is unremarkable. Other: No evidence of abdominal wall mass or hernia. Musculoskeletal: No acute or significant osseous findings. Slight superior chronic compression deformities of the L1 and L4 vertebral bodies is seen. Grade 1 anterolisthesis of L5 on S1 with chronic bilateral pars defects. IMPRESSION: Mild hyperenhancement of the bladder wall which is nonspecific, however could be due to mild cystitis. Please correlate with patient's laboratory exam. Moderate amount of rectal stool. Aortic Atherosclerosis (ICD10-I70.0). Electronically Signed   By: Prudencio Pair M.D.   On: 01/06/2020 05:59   CT L-SPINE NO CHARGE  Result Date: 01/06/2020 CLINICAL DATA:  Back pain EXAM: CT LUMBAR SPINE WITHOUT CONTRAST TECHNIQUE: Multidetector CT imaging of the lumbar spine was performed without intravenous contrast administration. Multiplanar CT image reconstructions were also generated. COMPARISON:  November 03, 2008 FINDINGS: Segmentation: There are 5 non-rib bearing lumbar type vertebral bodies with the last intervertebral disc space labeled as L5-S1. Alignment: There is a grade 1 anterolisthesis of L5 on S1 measuring 8 mm with chronic bilateral pars defects. Vertebrae: There is chronic slight superior compression deformity of the L1 vertebral body with less than 25% loss in height. Large anterior bridging osteophytes seen at T12-L1. There is been interval worsening of the superior compression deformity of the L4 vertebral body with approximately 40% loss in vertebral body height. There is calcifications seen along the anterior longitudinal ligament at L4-L5. No acute fracture, malalignment, or pathologic osseous lesions seen. Paraspinal and other soft tissues: The paraspinal  soft tissues and visualized retroperitoneal structures are unremarkable. Scattered vascular calcifications are noted. The sacroiliac joints are intact. Disc levels: T12-L1:  No significant canal or neural foraminal narrowing. L1-L2:   No significant canal or neural foraminal narrowing. L2-L3:   No significant canal or neural foraminal narrowing. L3-L4: Facet arthrosis and a broad-based disc bulge are present which causes mild bilateral neural foraminal narrowing. L4-L5: There is facet arthrosis and a broad-based disc bulge which causes mild bilateral neural foraminal narrowing. L5-S1: There is disc uncovering with facet arthrosis which causes moderate left and mild right neural foraminal narrowing. IMPRESSION: Grade 1 anterolisthesis of L5 on S1 with chronic bilateral pars defects. Slight interval progression in the anterior superior compression deformities of the L1 and L4 vertebral bodies. No retropulsion of fragments. Lumbar spine spondylosis most notable at L5-S1 with moderate left neural foraminal narrowing. Electronically Signed   By: Prudencio Pair M.D.   On: 01/06/2020 05:22    EKG: Independently reviewed.  Sinus rhythm at 67 bpm with background artifact otherwise unchanged.  Assessment/Plan Acute encephalopathy: Patient currently altered and not necessarily responding to verbal commands.  Question underlying infection versus uncontrolled pain as cause of patient's symptoms. -Admit to a medical telemetry bed -Neurochecks -Check MRI of the brain  Fever: Acute.  On admission patient noted to have fever up to 101.1 F.  Chest x-ray otherwise noted to be clear and urinalysis was not necessarily concerning for infection.  Point-of-care COVID-19 screening was negative, but confirmatory test was pending.  Patient had been started on empiric antibiotics of Rocephin.  Given neck rigidity question possibility of meningitis.  -Check blood and urine cultures -Check ESR, CRP, and procalcitonin -Follow-up  COVID-19 confirmatory testing -Add-on respiratory virus panel  -Acetaminophen suppository as needed for fever -Change antibiotics to vancomycin and Rocephin to treat for possibility of meningitis as unable to obtain lumbar puncture at this time  due to coagulopathy -Neurology formally consulted  Dysphagia: Patient not in large enough at this time to follow commands to check swallowing. -N.p.o. -Aspiration precaution -Speech therapy to evaluate   Hematuria: Acute.  Urinalysis positive for blood, but negative for signs of infection. -Follow-up urine culture  Supratherapeutic INR: On admission INR elevated up to 3.5. -Hold Coumadin -Anticoagulation per pharmacy  Weakness -PT/OT to evaluate and treat  Chronic back pain with lumbar radiculopathy: Medications include gabapentin 300 mg twice daily.  CT scan of the lumbar spine noted progressive changes that could be contributing to patient's pain. -Continue gabapentin -Morphine IV as needed pain  Essential hypertension: Blood pressures initially noted to be elevated up to 171/137.  Home medications include amlodipine 2.5 mg daily and metoprolol 12.5 mg twice daily.  -Continue home regimen when able  Sacral decubitus ulcer -Low air loss mattress replacement -Wound care consult   Vascular dementia: Home medications include Namenda 10 mg twice daily. -Continue Namenda  Anxiety: Patient on 0.25-0.5 mg Ativan daily as needed for anxiety. -Xanax as needed IV  Thrombocytopenia: Platelet count 132 on recheck this morning.  Review of records shows patient's platelet counts have entered mainly been low in the past. -Continue to monitor  BPH: Home medications include finasteride 5 mg daily -Continue finasteride  Obesity: BMI 33.2 kg/m  DVT prophylaxis: Therapeutic INR Code Status: Full Family Communication: Discussed plan of care with the patient's wife Disposition Plan: To be determined Consults called: None Admission status:  Inpatient  Norval Morton MD [facility] Pager [phone]   If 7PM-7AM, please contact night-coverage [email or web address] Password [facility]  01/06/2020, 8:55 AM

## 2020-01-07 ENCOUNTER — Ambulatory Visit: Payer: PPO

## 2020-01-07 DIAGNOSIS — R509 Fever, unspecified: Secondary | ICD-10-CM

## 2020-01-07 DIAGNOSIS — R791 Abnormal coagulation profile: Secondary | ICD-10-CM

## 2020-01-07 DIAGNOSIS — Z515 Encounter for palliative care: Secondary | ICD-10-CM

## 2020-01-07 DIAGNOSIS — G9340 Encephalopathy, unspecified: Secondary | ICD-10-CM

## 2020-01-07 DIAGNOSIS — R531 Weakness: Secondary | ICD-10-CM

## 2020-01-07 DIAGNOSIS — Z7189 Other specified counseling: Secondary | ICD-10-CM

## 2020-01-07 DIAGNOSIS — F0151 Vascular dementia with behavioral disturbance: Secondary | ICD-10-CM

## 2020-01-07 LAB — CBC
HCT: 38.6 % — ABNORMAL LOW (ref 39.0–52.0)
Hemoglobin: 13.1 g/dL (ref 13.0–17.0)
MCH: 31.3 pg (ref 26.0–34.0)
MCHC: 33.9 g/dL (ref 30.0–36.0)
MCV: 92.1 fL (ref 80.0–100.0)
Platelets: 120 10*3/uL — ABNORMAL LOW (ref 150–400)
RBC: 4.19 MIL/uL — ABNORMAL LOW (ref 4.22–5.81)
RDW: 12.8 % (ref 11.5–15.5)
WBC: 11 10*3/uL — ABNORMAL HIGH (ref 4.0–10.5)
nRBC: 0 % (ref 0.0–0.2)

## 2020-01-07 LAB — BASIC METABOLIC PANEL
Anion gap: 14 (ref 5–15)
BUN: 17 mg/dL (ref 8–23)
CO2: 21 mmol/L — ABNORMAL LOW (ref 22–32)
Calcium: 8.4 mg/dL — ABNORMAL LOW (ref 8.9–10.3)
Chloride: 103 mmol/L (ref 98–111)
Creatinine, Ser: 1.02 mg/dL (ref 0.61–1.24)
GFR calc Af Amer: >60 mL/min (ref >60)
GFR calc non Af Amer: >60 mL/min (ref >60)
Glucose, Bld: 119 mg/dL — ABNORMAL HIGH (ref 70–99)
Potassium: 3.7 mmol/L (ref 3.5–5.1)
Sodium: 138 mmol/L (ref 135–145)

## 2020-01-07 LAB — URINE CULTURE: Culture: NO GROWTH

## 2020-01-07 LAB — PROTIME-INR
INR: 4.3 (ref 0.8–1.2)
Prothrombin Time: 41.4 seconds — ABNORMAL HIGH (ref 11.4–15.2)

## 2020-01-07 MED ORDER — VITAMIN K1 10 MG/ML IJ SOLN
5.0000 mg | Freq: Once | INTRAVENOUS | Status: AC
  Administered 2020-01-07: 5 mg via INTRAVENOUS
  Filled 2020-01-07: qty 0.5

## 2020-01-07 MED ORDER — CHLORHEXIDINE GLUCONATE CLOTH 2 % EX PADS
6.0000 | MEDICATED_PAD | Freq: Every day | CUTANEOUS | Status: DC
  Administered 2020-01-07: 6 via TOPICAL

## 2020-01-07 MED ORDER — VANCOMYCIN HCL IN DEXTROSE 1-5 GM/200ML-% IV SOLN
1000.0000 mg | Freq: Two times a day (BID) | INTRAVENOUS | Status: DC
  Administered 2020-01-07 – 2020-01-08 (×2): 1000 mg via INTRAVENOUS
  Filled 2020-01-07 (×3): qty 200

## 2020-01-07 MED ORDER — ZINC OXIDE 40 % EX OINT
TOPICAL_OINTMENT | Freq: Two times a day (BID) | CUTANEOUS | Status: DC
  Filled 2020-01-07: qty 57

## 2020-01-07 NOTE — Progress Notes (Signed)
ANTICOAGULATION CONSULT NOTE  Pharmacy Consult for warfarin Indication: history of DVT   Assessment: 79 yom with history of DVT on warfarin presenting acute encephalopathy, fever. Pharmacy consulted to dose warfarin inpatient. INR still elevated at 4.3   PTA warfarin dose: 2.5mg  daily except 5mg  on Monday (last dose 2/27 PTA)  Goal of Therapy:  INR 2-3 Monitor platelets by anticoagulation protocol: Yes   Plan:  Hold warfarin tonight Daily INR Monitor CBC, s/sx bleeding  Thank you  3/27, PharmD 01/07/2020 11:26 AM

## 2020-01-07 NOTE — Progress Notes (Signed)
Advance care planning. Again met with patient's wife at the bedside, patient's niece Angie who is also helping with decision-making was on the speaker phone.  Patient is mostly unresponsive. I reviewed patient's current care, palliative care discussions.  After careful consideration, following decision was made.  -Changed to DNR/DNI.  Agreed by patient's wife and agreed by care team. -We will see how he responds to supportive treatment including antibiotics for next 24 to 48 hours. -If any signs of improvement, we may attempt lumbar puncture to establish a diagnosis, however if patient does not have any improvement by tomorrow, family prefers not to subject him through lumbar puncture and further invasive diagnostic testing rather focus on comfort. - hold off on any tube feeding/ NG tube placement today.

## 2020-01-07 NOTE — Progress Notes (Signed)
PMT consult received and chart reviewed. Discussed with Dr. Sloan Leiter. Met with wife at bedside this afternoon. Continue current plan of care and medical management. No decisions yet today regarding code status. Wife wishes to further process this conversation, review her husband's advance directive/living will, and read Hard Choices booklet. Wife does acknowledge she would not wish for Logan Valdez to suffer or be in misery if he did not show meaningful improvement.  PMT provider will follow-up with wife tomorrow regarding goals of care. Updated Dr. Sloan Leiter.   Full palliative note to follow.   NO CHARGE  Ihor Dow, [place], FNP-C Palliative Medicine Team  Phone: [phone] Fax: [phone]

## 2020-01-07 NOTE — Progress Notes (Signed)
OT Cancellation Note  Patient Details Name: Logan Valdez. MRN: [id number] DOB: 1946-09-29   Cancelled Treatment:    Reason Eval/Treat Not Completed: Patient's level of consciousness. Per RN pt obtunded and not appropriate for therapy this date. Pt potentially transitioning to comfort care. Palliative care consult still pending. OT will hold this date and follow up after palliative care consult.   Peterson Ao 01/07/2020, 12:23 PM

## 2020-01-07 NOTE — Progress Notes (Signed)
Received lab result for INR 4.3. Notified Greg PharmD.

## 2020-01-07 NOTE — Progress Notes (Signed)
Nutrition Brief Note  Chart reviewed. Pt likely transitioning to comfort care. Palliative care consult pending; pt obtunded NPO. No further nutrition interventions warranted at this time.  Please re-consult as needed.   Levada Schilling, RD, LDN, CDCES Registered Dietitian II Certified Diabetes Care and Education Specialist Please refer to [facility] for RD and/or RD on-call/weekend/after hours pager

## 2020-01-07 NOTE — Procedures (Signed)
Patient Name: Logan Valdez.  MRN: [id number]  Epilepsy Attending: Charlsie Quest  Referring Physician/Provider: Dr Georgiana Spinner [place] Date: 01/06/2020 Duration: 23.35 mins  Patient history: 74yo M with dementia, right PCA stroke, history of PE and DVT on Coumadin presents with 2 days progressive lethargy. EEG to evaluate for seizure.   Level of alertness: lethargy  AEDs during EEG study: None  Technical aspects: This EEG study was done with scalp electrodes positioned according to the 10-20 International system of electrode placement. Electrical activity was acquired at a sampling rate of 500Hz  and reviewed with a high frequency filter of 70Hz  and a low frequency filter of 1Hz . EEG data were recorded continuously and digitally stored.   DESCRIPTION: No clear posterior dominant rhythm was seen.  EEG showed continuous generalized polymorphic 3 to 5 Hz theta-delta slowing.  Sharp waves were seen in left parieto-occipital region, maximal P7/01.  Hyperventilation photic stimulation were not performed.  Abnormality -Sharp wave, left parieto-occipital region maximal P7/01 - Continuous slow, generalized  IMPRESSION: This study showed evidence of epileptogenicity in left parieto-occipital region as well as moderate diffuse encephalopathy, nonspecific to etiology. No seizures were seen throughout the recording.  [name] 

## 2020-01-07 NOTE — Progress Notes (Signed)
PROGRESS NOTE    Logan Logan.  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  According to the reports, at baseline patient has been able to walk few weeks ago.  He was looking more lethargic and more confused.  Also had few falls at home.  Patient was noted to be sleeping more and not eating or taking any of his medications.  Due to these symptoms she called EMS and patient was brought to the ER.  In the emergency department, temperature was 101.1.  Blood pressure is normal.  INR 3.5.  Bilirubin 2.4.  Ammonia 17.  Urinalysis was without evidence of infection.  Chest x-ray was clear.  CT scan of the head and spine did not show any acute injuries.  CT scan of the abdomen and pelvis revealed bladder wall thickening.  Patient was started on empiric antibiotics and admitted.   Assessment & Plan:   Principal Problem:   Acute encephalopathy Active Problems:   Obesity (BMI 30.0-34.9)   Lumbar radiculopathy   HTN (hypertension)   Vascular dementia (HCC)   Fever   Supratherapeutic INR   Thrombocytopenia (HCC)  Acute metabolic encephalopathy in the setting of underlying vascular dementia: Suspect systemic infection versus CNS infection with meningitis. Patient remains persistently encephalopathic, maintaining his airway.  NPO until awake.  Maintenance IV fluids. Treating with broad-spectrum antibiotics with Rocephin, vancomycin, acyclovir and ampicillin to also cover for meningitis encephalitis.  Received 1 dose of Decadron. INR is more than 4, unable to undergo lumbar puncture. MRI of the brain with no evidence of acute findings, shows old stroke. EEG with no evidence of acute seizure  episodes. Continue symptomatic treatment.  Will reverse INR in an attempt to do lumbar puncture tomorrow to establish or rule out CNS infection. Appreciate neurology recommendations.   History of DVT PE on Coumadin: No history of recent DVT PE within 6 months.  Will reverse Coumadin with vitamin K today in an attempt to establish CNS infection diagnosis.  History of hypertension: Blood pressure stable.  On as needed medications.  Advance care planning: Palliative medicine following.  Patient with advanced dementia now with persistent encephalopathy.  He may not have favorable outcome. Discussing with his spouse and family about goals of care. Appreciate palliative care involvement.  Keep NPO.  IV fluids.  Continue all supportive measures.  Will attempt lumbar puncture after INR less than 1.6. If no appropriate improvement, will recommend hospice.  DVT prophylaxis: SCDs Code Status: Full code.  Palliative involved. Family Communication: Wife at the bedside. Disposition Plan: patient is from home.. Anticipated DC to unknown., Barriers to discharge encephalopathic, on active treatment with unknown outcome.   Consultants:   Neurology  Palliative medicine  Procedures:   None  Antimicrobials:  Anti-infectives (From admission, onward)   Start     Dose/Rate Route Frequency Ordered Stop   01/07/20 2000  vancomycin (VANCOREADY) IVPB 1250 mg/250 mL  Status:  Discontinued     1,250 mg 166.7 mL/hr over 90 Minutes Intravenous Every 24 hours 01/06/20 1656 01/07/20 1125   01/07/20 1200  vancomycin (VANCOCIN) IVPB 1000 mg/200 mL premix     1,000 mg 200 mL/hr over 60 Minutes Intravenous Every 12 hours 01/07/20 1125     01/07/20  0630  cefTRIAXone (ROCEPHIN) 1 g in sodium chloride 0.9 % 100 mL IVPB  Status:  Discontinued     1 g 200 mL/hr over 30 Minutes Intravenous Every 24 hours 01/06/20 0935 01/06/20 1628   01/06/20 2200  ampicillin (OMNIPEN) 2 g in sodium chloride 0.9 % 100 mL IVPB      2 g 300 mL/hr over 20 Minutes Intravenous Every 4 hours 01/06/20 1656     01/06/20 2100  acyclovir (ZOVIRAX) 780 mg in dextrose 5 % 150 mL IVPB     10 mg/kg  78.1 kg (Adjusted) 165.6 mL/hr over 60 Minutes Intravenous Every 8 hours 01/06/20 2000     01/06/20 1730  cefTRIAXone (ROCEPHIN) 2 g in sodium chloride 0.9 % 100 mL IVPB     2 g 200 mL/hr over 30 Minutes Intravenous Every 12 hours 01/06/20 1628     01/06/20 1730  ampicillin (OMNIPEN) 2 g in sodium chloride 0.9 % 100 mL IVPB     2 g 300 mL/hr over 20 Minutes Intravenous STAT 01/06/20 1628 01/06/20 1831   01/06/20 1730  vancomycin (VANCOREADY) IVPB 2000 mg/400 mL     2,000 mg 200 mL/hr over 120 Minutes Intravenous  Once 01/06/20 1642 01/06/20 2038   01/06/20 1630  vancomycin (VANCOCIN) IVPB 1000 mg/200 mL premix  Status:  Discontinued     1,000 mg 200 mL/hr over 60 Minutes Intravenous  Once 01/06/20 1628 01/06/20 1642   01/06/20 0615  cefTRIAXone (ROCEPHIN) 1 g in sodium chloride 0.9 % 100 mL IVPB     1 g 200 mL/hr over 30 Minutes Intravenous  Once 01/06/20 0606 01/06/20 0718         Subjective: Patient was seen and examined.  He remains persistently obtunded, he moans when stimulated.  Unable to participate on conversation. Seen with wife at bedside.  Objective: Vitals:   01/06/20 1147 01/06/20 2224 01/07/20 0556 01/07/20 1518  BP: (!) 150/71 (!) 142/69 116/66 (!) 151/68  Pulse: 83 96 86 92  Resp: 18 18 18 18   Temp: 99.2 F (37.3 C) 97.8 F (36.6 C) 98.1 F (36.7 C) 100.1 F (37.8 C)  TempSrc: Axillary Oral Oral Oral  SpO2: 94% 96% 93% 96%  Weight: 96.2 kg     Height: 5\' 7"  (1.702 m)       Intake/Output Summary (Last 24 hours) at 01/07/2020 1552 Last data filed at 01/07/2020 03/08/2020 Gross per 24 hour  Intake 1110.39 ml  Output 1100 ml  Net 10.39 ml   Filed Weights   01/06/20 1147  Weight: 96.2 kg    Examination:  General exam: Sick looking gentleman, obtunded and unable to participate on  interview. Respiratory system: Clear to auscultation.  Some conducted airway sounds. Cardiovascular system: S1 & S2 heard, RRR. No JVD, murmurs, rubs, gallops or clicks. No pedal edema. Gastrointestinal system: Abdomen is nondistended, soft and nontender. No organomegaly or masses felt. Normal bowel sounds heard. Central nervous system: Patient is lethargic, obtunded, unable to participate.  Moans on strong stimulation. Neck rigidity present.  Also has generalized rigidity and spasticity. Patient has resting tremors upper extremities.    Data Reviewed: I have personally reviewed following labs and imaging studies  CBC: Recent Labs  Lab 01/05/20 1208 01/05/20 1618 01/06/20 0411 01/07/20 0147  WBC 7.5  --  9.6 11.0*  NEUTROABS 4.9  --   --   --   HGB 13.2 12.9* 13.9 13.1  HCT 41.3 38.0* 42.0 38.6*  MCV 96.5  --  94.4  92.1  PLT 116*  --  132* 120*   Basic Metabolic Panel: Recent Labs  Lab 01/05/20 1208 01/05/20 1618 01/06/20 1605 01/07/20 0147  NA 137 139  --  138  K 4.6 4.2  --  3.7  CL 104  --   --  103  CO2 22  --   --  21*  GLUCOSE 87  --   --  119*  BUN 12  --   --  17  CREATININE 1.05  --   --  1.02  CALCIUM 8.6*  --   --  8.4*  MG  --   --  1.7  --    GFR: Estimated Creatinine Clearance: 70.2 mL/min (by C-G formula based on SCr of 1.02 mg/dL). Liver Function Tests: Recent Labs  Lab 01/05/20 1208  AST 38  ALT 25  ALKPHOS 49  BILITOT 2.4*  PROT 5.9*  ALBUMIN 3.3*   No results for input(s): LIPASE, AMYLASE in the last 168 hours. Recent Labs  Lab 01/05/20 1613  AMMONIA 17   Coagulation Profile: Recent Labs  Lab 01/04/20 0000 01/06/20 1036 01/07/20 0147  INR 3.5* 3.4* 4.3*   Cardiac Enzymes: No results for input(s): CKTOTAL, CKMB, CKMBINDEX, TROPONINI in the last 168 hours. BNP (last 3 results) No results for input(s): PROBNP in the last 8760 hours. HbA1C: No results for input(s): HGBA1C in the last 72 hours. CBG: Recent Labs  Lab  01/05/20 1206  GLUCAP 75   Lipid Profile: No results for input(s): CHOL, HDL, LDLCALC, TRIG, CHOLHDL, LDLDIRECT in the last 72 hours. Thyroid Function Tests: Recent Labs    01/06/20 1036  TSH 1.535   Anemia Panel: No results for input(s): VITAMINB12, FOLATE, FERRITIN, TIBC, IRON, RETICCTPCT in the last 72 hours. Sepsis Labs: Recent Labs  Lab 01/06/20 0411 01/06/20 1036  PROCALCITON  --  <0.10  LATICACIDVEN 1.3  --     Recent Results (from the past 240 hour(s))  Culture, Urine     Status: None   Collection Time: 01/05/20  5:19 PM   Specimen: Urine, Random  Result Value Ref Range Status   Specimen Description URINE, RANDOM  Final   Special Requests NONE  Final   Culture   Final    NO GROWTH Performed at [facility], [address] [place], [place] [address]    Report Status 01/07/2020 FINAL  Final  SARS CORONAVIRUS 2 (TAT 6-24 HRS)     Status: None   Collection Time: 01/06/20  4:50 AM  Result Value Ref Range Status   SARS Coronavirus 2 NEGATIVE NEGATIVE Final    Comment: (NOTE) SARS-CoV-2 target nucleic acids are NOT DETECTED. The SARS-CoV-2 RNA is generally detectable in upper and lower respiratory specimens during the acute phase of infection. Negative results do not preclude SARS-CoV-2 infection, do not rule out co-infections with other pathogens, and should not be used as the sole basis for treatment or other patient management decisions. Negative results must be combined with clinical observations, patient history, and epidemiological information. The expected result is Negative. Fact Sheet for Patients: HairSlick.no Fact Sheet for Healthcare Providers: quierodirigir.com This test is not yet approved or cleared by the [place] FDA and  has been authorized for detection and/or diagnosis of SARS-CoV-2 by FDA under an Emergency Use Authorization (EUA). This EUA will remain  in effect (meaning  this test can be used) for the duration of the COVID-19 declaration under Section 56 4(b)(1) of the Act, 21 U.S.C. section 360bbb-3(b)(1),  unless the authorization is terminated or revoked sooner. Performed at [facility], [address], [place], [place] [address]   Respiratory Panel by PCR     Status: None   Collection Time: 01/06/20  9:30 AM   Specimen: Nasopharyngeal Swab; Respiratory  Result Value Ref Range Status   Adenovirus NOT DETECTED NOT DETECTED Final   Coronavirus 229E NOT DETECTED NOT DETECTED Final    Comment: (NOTE) The Coronavirus on the Respiratory Panel, DOES NOT test for the novel  Coronavirus (2019 nCoV)    Coronavirus HKU1 NOT DETECTED NOT DETECTED Final   Coronavirus NL63 NOT DETECTED NOT DETECTED Final   Coronavirus OC43 NOT DETECTED NOT DETECTED Final   Metapneumovirus NOT DETECTED NOT DETECTED Final   Rhinovirus / Enterovirus NOT DETECTED NOT DETECTED Final   Influenza A NOT DETECTED NOT DETECTED Final   Influenza B NOT DETECTED NOT DETECTED Final   Parainfluenza Virus 1 NOT DETECTED NOT DETECTED Final   Parainfluenza Virus 2 NOT DETECTED NOT DETECTED Final   Parainfluenza Virus 3 NOT DETECTED NOT DETECTED Final   Parainfluenza Virus 4 NOT DETECTED NOT DETECTED Final   Respiratory Syncytial Virus NOT DETECTED NOT DETECTED Final   Bordetella pertussis NOT DETECTED NOT DETECTED Final   Chlamydophila pneumoniae NOT DETECTED NOT DETECTED Final   Mycoplasma pneumoniae NOT DETECTED NOT DETECTED Final    Comment: Performed at [facility], [address] [place], [place] [address]  Culture, blood (routine x 2)     Status: None (Preliminary result)   Collection Time: 01/06/20 10:19 AM   Specimen: BLOOD RIGHT HAND  Result Value Ref Range Status   Specimen Description BLOOD RIGHT HAND  Final   Special Requests   Final    BOTTLES DRAWN AEROBIC AND ANAEROBIC Blood Culture results may not be optimal due to an inadequate volume of blood received in  culture bottles   Culture   Final    NO GROWTH 1 DAY Performed at [facility], [address] [place], [place] [address]    Report Status PENDING  Incomplete  Culture, blood (routine x 2)     Status: None (Preliminary result)   Collection Time: 01/06/20 10:19 AM   Specimen: BLOOD  Result Value Ref Range Status   Specimen Description BLOOD RIGHT ANTECUBITAL  Final   Special Requests   Final    BOTTLES DRAWN AEROBIC AND ANAEROBIC Blood Culture adequate volume   Culture   Final    NO GROWTH 1 DAY Performed at [facility], [address] [place], [place] [address]    Report Status PENDING  Incomplete         Radiology Studies: MR BRAIN WO CONTRAST  Result Date: 01/07/2020 CLINICAL DATA:  Initial evaluation for acute encephalopathy, weakness and confusion. EXAM: MRI HEAD WITHOUT CONTRAST TECHNIQUE: Multiplanar, multiecho pulse sequences of the brain and surrounding structures were obtained without intravenous contrast. COMPARISON:  Comparison made with prior CT from 01/05/2020 as well as previous MRI from 08/28/2008. FINDINGS: Brain: Diffuse prominence of the CSF containing spaces compatible with generalized age-related cerebral atrophy. Mild scattered patchy T2/FLAIR hyperintensity within the periventricular deep white matter both cerebral hemispheres most likely related chronic microvascular disease, mild for age. Encephalomalacia and gliosis within the left temporal occipital region consistent with chronic left PCA territory infarct. No abnormal foci of restricted diffusion to suggest acute or subacute ischemia. Gray-white matter differentiation maintained. No other areas of remote cortical infarction. No foci of susceptibility artifact  to suggest acute or chronic intracranial hemorrhage. No mass lesion, midline shift or mass effect. Mild ventricular prominence related global parenchymal volume loss without hydrocephalus. No extra-axial fluid collection. Pituitary gland  suprasellar region within normal limits. Midline structures intact. Vascular: Major intracranial vascular flow voids are maintained. Skull and upper cervical spine: Craniocervical junction normal. Upper cervical spine within normal limits. Bone marrow signal intensity normal. No scalp soft tissue abnormality. Sinuses/Orbits: Globes and orbital soft tissues within normal limits. Mild scattered mucosal thickening noted within the ethmoidal air cells and sphenoid sinuses. Paranasal sinuses are otherwise clear. No significant mastoid effusion. Inner ear structures grossly normal. Other: None. IMPRESSION: 1. No acute intracranial abnormality. 2. Chronic left PCA territory infarct. 3. Underlying age-related cerebral atrophy with mild chronic microvascular ischemic disease. Electronically Signed   By: Rise Mu M.D.   On: 01/07/2020 01:47   CT ABDOMEN PELVIS W CONTRAST  Result Date: 01/06/2020 CLINICAL DATA:  Back pain and urinary incontinence EXAM: CT ABDOMEN AND PELVIS WITH CONTRAST TECHNIQUE: Multidetector CT imaging of the abdomen and pelvis was performed using the standard protocol following bolus administration of intravenous contrast. CONTRAST:  OMNIPAQUE IOHEXOL 300 MG/ML  SOLN COMPARISON:  None. FINDINGS: Lower chest: The visualized heart size within normal limits. No pericardial fluid/thickening. No hiatal hernia. The visualized portions of the lungs are clear. Hepatobiliary: The liver is normal in density without focal abnormality.The main portal vein is patent. No evidence of calcified gallstones, gallbladder wall thickening or biliary dilatation. Pancreas: Unremarkable. No pancreatic ductal dilatation or surrounding inflammatory changes. Spleen: Normal in size without focal abnormality. Adrenals/Urinary Tract: Both adrenal glands appear normal. The kidneys and collecting system appear normal without evidence of urinary tract calculus or hydronephrosis. There is slight hyperenhancement of  the bladder wall. Stomach/Bowel: The stomach, small bowel, and colon are normal in appearance. A moderate amount of stool seen within the rectum. No inflammatory changes, wall thickening, or obstructive findings.The appendix is normal. Vascular/Lymphatic: There are no enlarged mesenteric, retroperitoneal, or pelvic lymph nodes. Scattered aortic atherosclerotic calcifications are seen without aneurysmal dilatation. Reproductive: The prostate is unremarkable. Other: No evidence of abdominal wall mass or hernia. Musculoskeletal: No acute or significant osseous findings. Slight superior chronic compression deformities of the L1 and L4 vertebral bodies is seen. Grade 1 anterolisthesis of L5 on S1 with chronic bilateral pars defects. IMPRESSION: Mild hyperenhancement of the bladder wall which is nonspecific, however could be due to mild cystitis. Please correlate with patient's laboratory exam. Moderate amount of rectal stool. Aortic Atherosclerosis (ICD10-I70.0). Electronically Signed   By: Jonna Clark M.D.   On: 01/06/2020 05:59   CT L-SPINE NO CHARGE  Result Date: 01/06/2020 CLINICAL DATA:  Back pain EXAM: CT LUMBAR SPINE WITHOUT CONTRAST TECHNIQUE: Multidetector CT imaging of the lumbar spine was performed without intravenous contrast administration. Multiplanar CT image reconstructions were also generated. COMPARISON:  November 03, 2008 FINDINGS: Segmentation: There are 5 non-rib bearing lumbar type vertebral bodies with the last intervertebral disc space labeled as L5-S1. Alignment: There is a grade 1 anterolisthesis of L5 on S1 measuring 8 mm with chronic bilateral pars defects. Vertebrae: There is chronic slight superior compression deformity of the L1 vertebral body with less than 25% loss in height. Large anterior bridging osteophytes seen at T12-L1. There is been interval worsening of the superior compression deformity of the L4 vertebral body with approximately 40% loss in vertebral body height. There is  calcifications seen along the anterior longitudinal ligament at L4-L5. No acute fracture, malalignment, or  pathologic osseous lesions seen. Paraspinal and other soft tissues: The paraspinal soft tissues and visualized retroperitoneal structures are unremarkable. Scattered vascular calcifications are noted. The sacroiliac joints are intact. Disc levels: T12-L1:  No significant canal or neural foraminal narrowing. L1-L2:   No significant canal or neural foraminal narrowing. L2-L3:   No significant canal or neural foraminal narrowing. L3-L4: Facet arthrosis and a broad-based disc bulge are present which causes mild bilateral neural foraminal narrowing. L4-L5: There is facet arthrosis and a broad-based disc bulge which causes mild bilateral neural foraminal narrowing. L5-S1: There is disc uncovering with facet arthrosis which causes moderate left and mild right neural foraminal narrowing. IMPRESSION: Grade 1 anterolisthesis of L5 on S1 with chronic bilateral pars defects. Slight interval progression in the anterior superior compression deformities of the L1 and L4 vertebral bodies. No retropulsion of fragments. Lumbar spine spondylosis most notable at L5-S1 with moderate left neural foraminal narrowing. Electronically Signed   By: Jonna ClarkBindu  Avutu M.D.   On: 01/06/2020 05:22        Scheduled Meds: . Chlorhexidine Gluconate Cloth  6 each Topical Daily  . dexamethasone (DECADRON) injection  10 mg Intravenous Once  . liver oil-zinc oxide   Topical BID  . sodium chloride flush  3 mL Intravenous Q12H   Continuous Infusions: . sodium chloride 75 mL/hr at 01/07/20 0600  . acyclovir 780 mg (01/07/20 1323)  . ampicillin (OMNIPEN) IV 2 g (01/07/20 1326)  . cefTRIAXone (ROCEPHIN)  IV 2 g (01/07/20 1020)  . vancomycin 1,000 mg (01/07/20 1213)     LOS: 1 day    Time spent: 30 minutes    Dorcas CarrowKuber Haunani Dickard, MD [facility] Pager [phone]

## 2020-01-07 NOTE — Plan of Care (Signed)
  Problem: Education: Goal: Knowledge of General Education information will improve Description Including pain rating scale, medication(s)/side effects and non-pharmacologic comfort measures Outcome: Progressing   

## 2020-01-07 NOTE — Progress Notes (Signed)
Visited with Miklos's room with his wife at bedside. She requested that we pray together for Logan Valdez, which we did. We also discussed decisions for end of life care. Will continue to be available for spiritual care as needed.  Rev. Margaretann Loveless Chaplain M. Div.

## 2020-01-07 NOTE — Progress Notes (Addendum)
Reason for consult: Encephalopathy  Subjective: No significant change in exam, remains obtunded.  Mouth open/ scream when I touch his neck-withdraws minimally to noxious stimulus.   ROS: negative except above   Examination  Vital signs in last 24 hours: Temp:  [97.8 F (36.6 C)-100.1 F (37.8 C)] 98.9 F (37.2 C) (03/03 1700) Pulse Rate:  [86-96] 92 (03/03 1518) Resp:  [18] 18 (03/03 1518) BP: (116-151)/(66-69) 151/68 (03/03 1518) SpO2:  [93 %-96 %] 96 % (03/03 1518)  General: lying in bed CVS: pulse-normal rate and rhythm RS: breathing comfortably Extremities: normal   Neuro: Mental status: Lethargic, nonverbal, moans to noxious stimulus,   Does not follow any commands. CN: Pupils are equal and sluggish, no obvious facial droop, no forced gaze deviation. Motor: Withdraws in all 4 extremities to noxious stimulus Plantars: Downgoing bilaterally Unable to assess gait and coordination  Gait: not tested  Basic Metabolic Panel: Recent Labs  Lab 01/05/20 1208 01/05/20 1618 01/06/20 1605 01/07/20 0147  NA 137 139  --  138  K 4.6 4.2  --  3.7  CL 104  --   --  103  CO2 22  --   --  21*  GLUCOSE 87  --   --  119*  BUN 12  --   --  17  CREATININE 1.05  --   --  1.02  CALCIUM 8.6*  --   --  8.4*  MG  --   --  1.7  --     CBC: Recent Labs  Lab 01/05/20 1208 01/05/20 1618 01/06/20 0411 01/07/20 0147  WBC 7.5  --  9.6 11.0*  NEUTROABS 4.9  --   --   --   HGB 13.2 12.9* 13.9 13.1  HCT 41.3 38.0* 42.0 38.6*  MCV 96.5  --  Logan.4 92.1  PLT 116*  --  132* 120*     Coagulation Studies: Recent Labs    01/06/20 1036 01/07/20 0147  LABPROT 33.9* 41.4*  INR 3.4* 4.3*    Imaging Reviewed:  MRI brain w/o contrast : No acute stroke    ASSESSMENT AND PLAN  Logan Valdez wit dementia, right PCA stroke, history of PE and DVT on Coumadin presents with 2 days progressive lethargy, noted to be febrile and have neck stiffness-neurology consulted for possible meningitis  evaluation.  No other source of fever identified.  Ammonia 17.  VPA level 12 ( depakote for mood, 125mg  daily) .   EEG was performed yesterday-showed no epileptiform discharges/seizures.  MRI brain without contrast performed today shows no acute findings.  Patient has not shown significant improvement despite broad-spectrum empiric antibiotics, acyclovir was added yesterday.  Neck remains rigid/tender-unclear if this is from meningismus versus cervicalgia. Fever has improved- had recorded temp is 100.1  Acute on chronic encephalopathy Delirium on dementia Possible meningitis  Recommendations -Continue antibiotics, acyclovir -Agree with reversal of INR with vitamin K, if INR less than 1.5 will perform LP to rule out meningitis so that we can DC acyclovir and and broad-spectrum antibiotics -Continue treating underlying metabolic conditions -Agree with palliative care consult, patient made DNR/DNI  Neurology will continue to follow.  Eldonna Neuenfeldt [facility] Pager Number Georgiana Spinner For questions after 7pm please refer to AMION to reach the Neurologist on call

## 2020-01-07 NOTE — Social Work (Signed)
Per morning report GOC pending for pt.   CSW continuing to follow for support with disposition when medically appropriate.  Octavio Graves, MSW, LCSW [facility] Clinical Social Work

## 2020-01-07 NOTE — Consult Note (Signed)
Consultation Note Date: 01/07/20  Patient Name: Logan Valdez.  DOB: 01/07/1946  MRN: [id number]  Age / Sex: 74 y.o., male  PCP: Ria Bush, MD Referring Physician: Barb Merino, MD  Reason for Consultation: Establishing goals of care  HPI/Patient Profile: 74 y.o. 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 Patient with nuchal rigidity and possibility of meningitis with no other clear source for infection. CT/MRI negative for acute findings, with atrophy and chronic microvascular ischemic changes. Neurology following. LP contraindicated at this time with coagulopathy (INR 4.3). Antibiotics initiated. EEG with epileptogenicity in left parieto-occipital region, moderate diffuse encephalopathy nonspecific to etiology, but no seizures. Palliative medicine consultation for goals of care.   Clinical Assessment and Goals of Care:  I have reviewed medical records, discussed with Dr. Sloan Leiter, and assessed the patient at bedside. He is lethargic. Will wake to stimuli or voice and moans. Protecting airway. Does not appear to be in pain or discomfort.   This afternoon, met with wife Logan Valdez at bedside to discuss goals of care.   I introduced Palliative Medicine as specialized medical care for people living with serious illness. It focuses on providing relief from the symptoms and stress of a serious illness. The goal is to improve quality of life for both the patient and the family.  We discussed a brief life review of the patient. Married to [place] for almost 50 years! He has four daughters from a previous marriage and one step-daughter. Wife is primary support system at home and shares that it has been more challenging to care for him. CVA ~12 years ago and  progressive dementia, especially in the last few months. Chronic back pain.   Discussed events leading up to hospitalization and course of hospitalization including diagnoses, interventions, plan of care. Discussed disease trajectory of dementia and likely significant decline with possible acute infection. Discussed concern with lack of clinical improvement despite IVF and antibiotics.   I attempted to elicit values and goals of care important to the patient and wife. Advanced directives, concepts specific to code status, artifical feeding and hydration, and rehospitalization were considered and discussed. Wife shares that he has a documented living will and they have previously spoken of his EOL wishes (only one page of HCPOA paperwork scanned into EMR). Requested copy. Logan Valdez believes his documentation expresses his wishes against life-prolonging interventions if terminally ill. Logan Valdez share he would not wish to be "a vegetable" or dependent on others. She shares hopes that he may show improvement with antibiotics and IVF but that she does not wish to see him in "misery" or to "suffer." He has endured chronic back pain for many years.   The difference between aggressive medical intervention and comfort care was considered in light of the patient's goals of care.   Discussed MOST form and recommendation for DNR/DNI with underlying age, frailty, chronic conditions, and poor chance of meaningful survival.  Logan Valdez is leaning against aggressive interventions but not ready to make decisions yet today. She wishes to process this conversation, read Hard Choices booklet, review his living will documentation, and discuss with family.  Hospice and Palliative Care services outpatient were explained and offered.  Questions and concerns were addressed.  Hard Choices booklet left for review. PMT contact information given. Reassured of PMT follow-up tomorrow.    SUMMARY OF RECOMMENDATIONS    Continue current plan of  care and medical management.  No decisions today regarding code status, but wife leaning towards DNR/DNI and against aggressive medical interventions including feeding tube or LP.   Wife needs more time to process our conversation, read Hard choices booklet, and plans to review his living will documentation before making decisions.   PMT provider will f/u with wife in AM. Updated Dr. Sloan Leiter.   Code Status/Advance Care Planning:  Full code  Symptom Management:   Per attending  Palliative Prophylaxis:   Aspiration, Delirium Protocol, Frequent Pain Assessment, Oral Care and Turn Reposition  Psycho-social/Spiritual:   Desire for further Chaplaincy support:yes  Additional Recommendations: Caregiving  Support/Resources, Compassionate Wean Education and Education on Hospice  Prognosis:   Guarded to poor  Discharge Planning: To Be Determined      Primary Diagnoses: Present on Admission: . Acute encephalopathy . Vascular dementia ([place]) . Obesity (BMI 30.0-34.9) . Supratherapeutic INR . Thrombocytopenia ([place]) . Lumbar radiculopathy . HTN (hypertension) . Fever   I have reviewed the medical record, interviewed the patient and family, and examined the patient. The following aspects are pertinent.  Past Medical History:  Diagnosis Date  . Chronic lower back pain    [place]  . DDD (degenerative disc disease), lumbar   . Dysmetabolic syndrome X   . Gout, unspecified   . History of DVT (deep vein thrombosis)   . History of pulmonary embolism   . History of stroke 2009   [place]  . History of thrombophlebitis   . HTN (hypertension)   . Memory loss   . Mixed hyperlipidemia   . Obesity   . PFO (patent foramen ovale)   . Primary hypercoagulable state ([place]) 05/14/2008   Personal h/o DVT, PE, PFO, and lupus AC +   . Stage I pressure ulcer of left buttock 04/25/2019  . Stroke ([place])   . Type II or unspecified type diabetes mellitus without mention of complication, uncontrolled    . Unspecified sleep apnea    Social History   Socioeconomic History  . Marital status: Married    Spouse name: Not on file  . Number of children: 4  . Years of education: 8 th  . Highest education level: Not on file  Occupational History  . Not on file  Tobacco Use  . Smoking status: Never Smoker  . Smokeless tobacco: Never Used  Substance and Sexual Activity  . Alcohol use: No    Alcohol/week: 0.0 standard drinks  . Drug use: No  . Sexual activity: Not Currently  Other Topics Concern  . Not on file  Social History Narrative   Patient is married with 4 children.   Patient is right handed.   Patient has 8 th grade education.   Patient drinks 4-5 cups daily.   Social Determinants of Health   Financial Resource Strain:   . Difficulty of Paying Living Expenses: Not on file  Food Insecurity:   . Worried About Charity fundraiser in the Last Year: Not on file  . Ran Out of Food in  the Last Year: Not on file  Transportation Needs:   . Lack of Transportation (Medical): Not on file  . Lack of Transportation (Non-Medical): Not on file  Physical Activity:   . Days of Exercise per Week: Not on file  . Minutes of Exercise per Session: Not on file  Stress:   . Feeling of Stress : Not on file  Social Connections:   . Frequency of Communication with Friends and Family: Not on file  . Frequency of Social Gatherings with Friends and Family: Not on file  . Attends Religious Services: Not on file  . Active Member of Clubs or Organizations: Not on file  . Attends Archivist Meetings: Not on file  . Marital Status: Not on file   Family History  Problem Relation Age of Onset  . Emphysema Father   . Diabetes Brother   . Hypertension Brother   . Heart attack Brother 25  . Skin cancer Brother   . Lung cancer Brother   . Cancer Maternal Aunt        ? Type   . Stroke Neg Hx    Scheduled Meds: . Chlorhexidine Gluconate Cloth  6 each Topical Daily  . dexamethasone  (DECADRON) injection  10 mg Intravenous Once  . liver oil-zinc oxide   Topical BID  . sodium chloride flush  3 mL Intravenous Q12H   Continuous Infusions: . sodium chloride 75 mL/hr at 01/08/20 0529  . acyclovir 780 mg (01/08/20 0531)  . ampicillin (OMNIPEN) IV 2 g (01/08/20 0528)  . cefTRIAXone (ROCEPHIN)  IV 2 g (01/08/20 0752)  . magnesium sulfate bolus IVPB    . potassium chloride 10 mEq (01/08/20 0759)  . vancomycin 1,000 mg (01/08/20 0057)   PRN Meds:.acetaminophen, guaiFENesin, ipratropium-albuterol, labetalol, morphine injection, triamcinolone cream Medications Prior to Admission:  Prior to Admission medications   Medication Sig Start Date End Date Taking? Authorizing Provider  acetaminophen (TYLENOL) 500 MG tablet Take 500 mg by mouth 3 (three) times daily.   Yes [provider]  amLODipine (NORVASC) 2.5 MG tablet Take 1 tablet by mouth once daily Patient taking differently: Take 2.5 mg by mouth daily.  07/04/19  Yes Ria Bush, MD  atorvastatin (LIPITOR) 40 MG tablet Take 1 tablet (40 mg total) by mouth daily. 06/19/19  Yes Ria Bush, MD  bacitracin ointment Apply 1 application topically 2 (two) times daily. 11/25/19  Yes Varney Biles, MD  clotrimazole (LOTRIMIN) 1 % cream Apply 1 application topically 2 (two) times daily. 09/19/18  Yes Ria Bush, MD  cyanocobalamin 500 MCG tablet Take 500 mcg by mouth daily. Vitamin B12   Yes [provider]  dextromethorphan (DELSYM) 30 MG/5ML liquid Take 15-60 mg by mouth 2 (two) times daily as needed for cough.   Yes [provider]  divalproex (DEPAKOTE SPRINKLE) 125 MG capsule Take 1 capsule by mouth at bedtime 12/08/19  Yes McCue, Janett Billow, NP  docusate sodium (COLACE) 100 MG capsule Take 100 mg by mouth daily as needed for mild constipation.   Yes [provider]  finasteride (PROSCAR) 5 MG tablet Take 1 tablet by mouth once daily Patient taking differently: Take 5 mg by mouth  daily.  07/04/19  Yes Ria Bush, MD  FOLIC ACID PO Take 3 tablets by mouth 2 (two) times daily.   Yes [provider]  gabapentin (NEURONTIN) 300 MG capsule Take 1 capsule (300 mg total) by mouth 2 (two) times daily. 12/12/19  Yes Ria Bush, MD  guaiFENesin [facility])  600 MG 12 hr tablet Take 600-1,200 mg by mouth 2 (two) times daily as needed for cough or to loosen phlegm.   Yes [provider]  LORazepam (ATIVAN) 0.5 MG tablet Take 0.5-1 tablets (0.25-0.5 mg total) by mouth daily as needed for anxiety. 09/19/18  Yes Ria Bush, MD  memantine (NAMENDA) 10 MG tablet Take 1 tablet (10 mg total) by mouth 2 (two) times daily. 12/15/19  Yes Frann Rider, NP  metoprolol tartrate (LOPRESSOR) 25 MG tablet Take 1/2 (one-half) tablet by mouth twice daily Patient taking differently: Take 12.5 mg by mouth 2 (two) times daily.  11/14/19  Yes Ria Bush, MD  polyethylene glycol powder (GLYCOLAX/MIRALAX) powder DISSOLVE 1 CAPFUL (17GM) INTO 4 TO 8 OUNCES OF FLUID AND TAKE BY MOUTH ONCE DAILY AS DIRECTED 05/01/18  Yes Ria Bush, MD  Pyridoxine HCl (VITAMIN B-6 PO) Take 1 tablet by mouth 2 (two) times daily.   Yes [provider]  triamcinolone cream (KENALOG) 0.1 % Apply 1 application topically 2 (two) times daily as needed (dry skin with rash). Apply to [place]. 10/06/19  Yes Ria Bush, MD  warfarin (COUMADIN) 5 MG tablet TAKE AS DIRECTED BY  COUMADIN  CLINIC Patient taking differently: Take 2.5-5 mg by mouth See admin instructions. TAKE AS DIRECTED BY COUMADIN CLINIC; 5 mg (5 mg x 1) every Mon, Thu; 2.5 mg (5 mg x 0.5) all other days 07/04/19  Yes Ria Bush, MD  Hydroactive Dressings (TEGADERM HYDROCOLLOID) MISC Apply 1 each topically once a week. 06/30/19   Ria Bush, MD  ipratropium-albuterol (DUONEB) 0.5-2.5 (3) MG/3ML SOLN Take 3 mLs by nebulization every 2 (two) hours as needed. Patient not taking: Reported on 01/06/2020 02/19/17   Theodis Blaze, MD  metoprolol succinate (TOPROL-XL) 100 MG 24 hr tablet 1/2 by mouth daily 12/12/12 02/12/13  Hendricks Limes, MD   Allergies  Allergen Reactions  . Hydrochlorothiazide Other (See Comments)    Mental changes   . Indomethacin     syncope  . Pravastatin Sodium     REACTION: rash 11/09  . Exelon [Rivastigmine Tartrate] Other (See Comments)    Had watery mucousy diarrhea, and skin reaction.  . Lisinopril     REACTION: HA  . Micardis [Telmisartan]     REACTION: HA   Review of Systems  Unable to perform ROS: Acuity of condition   Physical Exam Vitals and nursing note reviewed.  Constitutional:      Appearance: He is ill-appearing.  HENT:     Head: Normocephalic and atraumatic.  Pulmonary:     Effort: No tachypnea, accessory muscle usage or respiratory distress.  Skin:    General: Skin is warm and dry.  Neurological:     Mental Status: He is lethargic.     Comments: Will wake to stimuli/voice, moans  Psychiatric:        Attention and Perception: He is inattentive.        Cognition and Memory: Cognition is impaired.    Vital Signs: BP (!) 124/100 (BP Location: Right Arm)   Pulse 77   Temp 99.5 F (37.5 C) (Axillary)   Resp 17   Ht _0  (1.702 m)   Wt 96.2 kg   SpO2 98%   BMI 33.20 kg/m  Pain Scale: PAINAD   Pain Score: Asleep   SpO2: SpO2: 98 % O2 Device:SpO2: 98 % O2 Flow Rate: .   IO: Intake/output summary:   Intake/Output Summary (Last 24 hours) at 01/08/2020 8828 Last data filed  at 01/08/2020 8850 Gross per 24 hour  Intake 1618 ml  Output 750 ml  Net 868 ml    LBM: Last BM Date: 01/05/20 Baseline Weight: Weight: 96.2 kg Most recent weight: Weight: 96.2 kg     Palliative Assessment/Data: PPS 20%   Flowsheet Rows     Most Recent Value  Intake Tab  Referral Department  Hospitalist  Palliative Care Primary Diagnosis  Neurology  Palliative Care Type  New Palliative care  Date first seen by Palliative Care  01/07/20  Clinical Assessment   Palliative Performance Scale Score  20%  Psychosocial & Spiritual Assessment  Palliative Care Outcomes  Patient/Family meeting held?  Yes  Who was at the meeting?  wife  Palliative Care Outcomes  Clarified goals of care, Counseled regarding hospice, Provided end of life care assistance, Provided psychosocial or spiritual support, ACP counseling assistance      Time In/Out: 1040-1055, 2774-1287 Time Total: 90 Greater than 50%  of this time was spent counseling and coordinating care related to the above assessment and plan.  Signed by:  Ihor Dow, DNP, FNP-C Palliative Medicine Team  Phone: [phone] Fax: [phone]   Please contact Palliative Medicine Team phone at [phone] for questions and concerns.  For individual provider: See Shea Evans

## 2020-01-07 NOTE — Progress Notes (Signed)
SLP Cancellation Note  Patient Details Name: Logan Valdez. MRN: [id number] DOB: December 17, 1945   Cancelled treatment:       Reason Eval/Treat Not Completed: Patient's level of consciousness. Per RN pt obtunded and likely transitioning to full comfort measures. Will sign off at this time.    Emmalene Kattner, Riley Nearing 01/07/2020, 9:25 AM

## 2020-01-07 NOTE — Consult Note (Signed)
WOC Nurse Consult Note: Patient receiving care in [facility] [phone].  Assisted with turning by Arna Medici, NT.  Patient's spouse in room. Reason for Consult: "Decubitus ulcer" Wound type: NO wound present to buttocks.  It is apparent from assessing the site that the patient has an old, healed, MASD Incontinence associated area on bilateral buttocks.  The wife states she has been taking care of the areas for weeks.  They look good, with some peeling, but no open areas. Pressure Injury POA: Yes/No/NA Measurement: Wound bed: some peeling to buttocks Drainage (amount, consistency, odor) none Periwound: intact Dressing procedure/placement/frequency: Desitin has been ordered from pharmacy:  Apply to buttocks twice daily and with each episode of pericare.  I have also ordered bilateral Prevalon boots to prevent heel injury. Thank you for the consult.  Discussed plan of care with the patient and bedside nurse Nikita.  WOC nurse will not follow at this time.  Please re-consult the WOC team if needed.  Helmut Muster, RN, MSN, CWOCN, CNS-BC, pager [phone]

## 2020-01-08 LAB — COMPREHENSIVE METABOLIC PANEL
ALT: 25 U/L (ref 0–44)
AST: 46 U/L — ABNORMAL HIGH (ref 15–41)
Albumin: 2.6 g/dL — ABNORMAL LOW (ref 3.5–5.0)
Alkaline Phosphatase: 42 U/L (ref 38–126)
Anion gap: 12 (ref 5–15)
BUN: 11 mg/dL (ref 8–23)
CO2: 23 mmol/L (ref 22–32)
Calcium: 8.1 mg/dL — ABNORMAL LOW (ref 8.9–10.3)
Chloride: 104 mmol/L (ref 98–111)
Creatinine, Ser: 0.86 mg/dL (ref 0.61–1.24)
GFR calc Af Amer: >60 mL/min (ref >60)
GFR calc non Af Amer: >60 mL/min (ref >60)
Glucose, Bld: 102 mg/dL — ABNORMAL HIGH (ref 70–99)
Potassium: 3.3 mmol/L — ABNORMAL LOW (ref 3.5–5.1)
Sodium: 139 mmol/L (ref 135–145)
Total Bilirubin: 1.7 mg/dL — ABNORMAL HIGH (ref 0.3–1.2)
Total Protein: 5.3 g/dL — ABNORMAL LOW (ref 6.5–8.1)

## 2020-01-08 LAB — CBC WITH DIFFERENTIAL/PLATELET
Abs Immature Granulocytes: 0.03 10*3/uL (ref 0.00–0.07)
Basophils Absolute: 0 10*3/uL (ref 0.0–0.1)
Basophils Relative: 0 %
Eosinophils Absolute: 0.1 10*3/uL (ref 0.0–0.5)
Eosinophils Relative: 2 %
HCT: 36.1 % — ABNORMAL LOW (ref 39.0–52.0)
Hemoglobin: 12.1 g/dL — ABNORMAL LOW (ref 13.0–17.0)
Immature Granulocytes: 0 %
Lymphocytes Relative: 19 %
Lymphs Abs: 1.6 10*3/uL (ref 0.7–4.0)
MCH: 31 pg (ref 26.0–34.0)
MCHC: 33.5 g/dL (ref 30.0–36.0)
MCV: 92.6 fL (ref 80.0–100.0)
Monocytes Absolute: 1.1 10*3/uL — ABNORMAL HIGH (ref 0.1–1.0)
Monocytes Relative: 12 %
Neutro Abs: 5.9 10*3/uL (ref 1.7–7.7)
Neutrophils Relative %: 67 %
Platelets: 117 10*3/uL — ABNORMAL LOW (ref 150–400)
RBC: 3.9 MIL/uL — ABNORMAL LOW (ref 4.22–5.81)
RDW: 12.5 % (ref 11.5–15.5)
WBC: 8.8 10*3/uL (ref 4.0–10.5)
nRBC: 0 % (ref 0.0–0.2)

## 2020-01-08 LAB — PHOSPHORUS: Phosphorus: 2.4 mg/dL — ABNORMAL LOW (ref 2.5–4.6)

## 2020-01-08 LAB — MAGNESIUM: Magnesium: 1.6 mg/dL — ABNORMAL LOW (ref 1.7–2.4)

## 2020-01-08 LAB — PROTIME-INR
INR: 1.5 — ABNORMAL HIGH (ref 0.8–1.2)
Prothrombin Time: 18.1 seconds — ABNORMAL HIGH (ref 11.4–15.2)

## 2020-01-08 MED ORDER — MAGNESIUM SULFATE 2 GM/50ML IV SOLN
2.0000 g | Freq: Once | INTRAVENOUS | Status: DC
  Filled 2020-01-08: qty 50

## 2020-01-08 MED ORDER — LORAZEPAM 2 MG/ML PO CONC: 1.0000 mg | ORAL | Status: DC | PRN

## 2020-01-08 MED ORDER — GLYCOPYRROLATE 0.2 MG/ML IJ SOLN: 0.2000 mg | INTRAMUSCULAR | Status: DC | PRN

## 2020-01-08 MED ORDER — LORAZEPAM 2 MG/ML IJ SOLN: 1.0000 mg | INTRAMUSCULAR | Status: DC | PRN

## 2020-01-08 MED ORDER — GLYCOPYRROLATE 0.2 MG/ML IJ SOLN
0.2000 mg | INTRAMUSCULAR | Status: DC | PRN
  Administered 2020-01-08 – 2020-01-09 (×2): 0.2 mg via INTRAVENOUS
  Filled 2020-01-08 (×2): qty 1

## 2020-01-08 MED ORDER — POTASSIUM CHLORIDE 10 MEQ/100ML IV SOLN
10.0000 meq | INTRAVENOUS | Status: DC
  Administered 2020-01-08: 10 meq via INTRAVENOUS
  Filled 2020-01-08: qty 100

## 2020-01-08 MED ORDER — HALOPERIDOL LACTATE 5 MG/ML IJ SOLN: 0.5000 mg | INTRAMUSCULAR | Status: DC | PRN

## 2020-01-08 MED ORDER — HALOPERIDOL 0.5 MG PO TABS
0.5000 mg | ORAL_TABLET | ORAL | Status: DC | PRN
  Filled 2020-01-08: qty 1

## 2020-01-08 MED ORDER — HYDROMORPHONE HCL 1 MG/ML IJ SOLN
0.5000 mg | INTRAMUSCULAR | Status: DC | PRN
  Administered 2020-01-08 – 2020-01-09 (×6): 0.5 mg via INTRAVENOUS
  Filled 2020-01-08 (×6): qty 1

## 2020-01-08 MED ORDER — LORAZEPAM 1 MG PO TABS: 1.0000 mg | ORAL_TABLET | ORAL | Status: DC | PRN

## 2020-01-08 MED ORDER — ONDANSETRON 4 MG PO TBDP: 4.0000 mg | ORAL_TABLET | Freq: Four times a day (QID) | ORAL | Status: DC | PRN

## 2020-01-08 MED ORDER — ONDANSETRON HCL 4 MG/2ML IJ SOLN: 4.0000 mg | Freq: Four times a day (QID) | INTRAMUSCULAR | Status: DC | PRN

## 2020-01-08 MED ORDER — GLYCOPYRROLATE 1 MG PO TABS
1.0000 mg | ORAL_TABLET | ORAL | Status: DC | PRN
  Filled 2020-01-08: qty 1

## 2020-01-08 MED ORDER — HALOPERIDOL LACTATE 2 MG/ML PO CONC
0.5000 mg | ORAL | Status: DC | PRN
  Filled 2020-01-08: qty 0.3

## 2020-01-08 NOTE — Progress Notes (Signed)
Daily Progress Note   Patient Name: Logan Valdez.       Date: 01/08/2020 DOB: Dec 06, 1945  Age: 74 y.o. MRN#: [id number] Attending Physician: Dorcas Carrow, MD Primary Care Physician: Eustaquio Boyden, MD Admit Date: 01/05/2020  Reason for Consultation/Follow-up: Establishing goals of care and Terminal Care  Subjective: Patient lethargic. Will not wake to voice. Does not follow commands. Appears comfortable.   GOC:  F/u with wife, Logan Valdez at bedside.  Logan Valdez is very appreciative of our conversation yesterday as well as conversations with attending and neurology. After further thought and discussion with family, Logan Valdez confirms her decision to transition to comfort focused care. She confirms decision for DNR/DNI, no feeding tube and also no LP, sharing she does not wish to put Demarco through pain or discomfort. He has endured chronic back pain as well as progressive dementia. She does not wish to see him suffer and it has been important for her to keep him out of a nursing facility.   Discussed transition to comfort measures only explaining that interventions not aimed at comfort will be discontinued including IVF/ABX. Educated on medications to assist with comfort and symptom management. Discussed EOL expectations and continuing good oral care.   Discussed hospice options and philosophy. Logan Valdez prefers hospice facility, understanding poor prognosis of likely weeks if not less.   Therapeutic listening as Logan Valdez shares many stories of her husband. She has great support through their church community. Emotional/spiritual support provided. Logan Valdez has PMT contact information and understands she can call with questions or concerns.   Length of Stay: 2  Current Medications: Scheduled Meds:  .  dexamethasone (DECADRON) injection  10 mg Intravenous Once    Continuous Infusions: . sodium chloride 75 mL/hr at 01/08/20 0529    PRN Meds: acetaminophen, glycopyrrolate **OR** glycopyrrolate **OR** glycopyrrolate, haloperidol **OR** haloperidol **OR** haloperidol lactate, HYDROmorphone (DILAUDID) injection, ipratropium-albuterol, LORazepam **OR** LORazepam **OR** LORazepam, ondansetron **OR** ondansetron (ZOFRAN) IV  Physical Exam Vitals and nursing note reviewed.  Constitutional:      Appearance: He is ill-appearing.  HENT:     Head: Normocephalic and atraumatic.  Pulmonary:     Effort: No tachypnea, accessory muscle usage or respiratory distress.  Skin:    General: Skin is warm and dry.  Neurological:     Mental Status: He is lethargic.  Vital Signs: BP (!) 124/100 (BP Location: Right Arm)   Pulse 77   Temp 99.5 F (37.5 C) (Axillary)   Resp 17   Ht 5\' 7"  (1.702 m)   Wt 96.2 kg   SpO2 98%   BMI 33.20 kg/m  SpO2: SpO2: 98 % O2 Device: O2 Device: Room Air O2 Flow Rate:    Intake/output summary:   Intake/Output Summary (Last 24 hours) at 01/08/2020 3716 Last data filed at 01/08/2020 9678 Gross per 24 hour  Intake 1618 ml  Output 750 ml  Net 868 ml   LBM: Last BM Date: 01/05/20 Baseline Weight: Weight: 96.2 kg Most recent weight: Weight: 96.2 kg       Palliative Assessment/Data: PPS 20%    Flowsheet Rows     Most Recent Value  Intake Tab  Referral Department  Hospitalist  Palliative Care Primary Diagnosis  Neurology  Palliative Care Type  New Palliative care  Date first seen by Palliative Care  01/07/20  Clinical Assessment  Palliative Performance Scale Score  20%  Psychosocial & Spiritual Assessment  Palliative Care Outcomes  Patient/Family meeting held?  Yes  Who was at the meeting?  wife  Palliative Care Outcomes  Clarified goals of care, Counseled regarding hospice, Provided end of life care assistance, Provided psychosocial or spiritual  support, ACP counseling assistance      Patient Active Problem List   Diagnosis Date Noted  . Palliative care by specialist   . Generalized weakness   . Acute encephalopathy 01/06/2020  . Supratherapeutic INR 01/06/2020  . Thrombocytopenia ([place]) 01/06/2020  . Xerosis of skin 10/06/2019  . Balanitis 09/19/2018  . Scrotal swelling 09/19/2018  . Daytime somnolence 09/19/2018  . Agitation 05/31/2018  . Nocturia 05/01/2018  . Goals of care, counseling/discussion 01/07/2018  . Long term (current) use of anticoagulants 11/15/2017  . Encounter for long-term (current) use of antiplatelets/antithrombotics 10/18/2017  . COPD (chronic obstructive pulmonary disease) ([place]) 03/05/2017  . Fever 02/16/2017  . Poor urinary stream 05/06/2016  . Urge incontinence 05/06/2016  . History of stroke 01/12/2016  . Chronic back pain 09/07/2015  . Leg cramps 06/22/2015  . Drug-induced constipation with proper administration 04/12/2015  . 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 Heme positive stool 01/13/2015  . Medicare annual wellness visit, subsequent 12/22/2014  . Advanced care planning/counseling discussion 12/22/2014  . B12 deficiency 12/13/2014  . Vascular dementia ([place]) 12/09/2014  . Encounter for therapeutic drug monitoring 12/17/2013  . 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 HTN (hypertension) 09/18/2012  . PFO (patent foramen ovale) 04/08/2009  . OSA (obstructive sleep apnea) 04/08/2009  . COMPRESSION FRACTURE, L4 VERTEBRA 11/04/2008  . Diabetes mellitus type 2, controlled, with complications ([place]) 93/81/0175  . Primary hypercoagulable state ([place]) 05/14/2008  . Lumbar radiculopathy 05/14/2008  . Gout 12/09/2007  . METABOLIC SYNDROME X 08/30/8526  . Obesity (BMI 30.0-34.9) 12/09/2007  . PULMONARY EMBOLISM, HX OF 12/09/2007  . DEEP VENOUS THROMBOPHLEBITIS, HX OF 12/09/2007  . HYPERLIPIDEMIA 12/05/2007    Palliative Care  Assessment & Plan   Patient Profile: 74 y.o. 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 Patient with nuchal rigidity and possibility of meningitis with no other clear source for infection. CT/MRI negative for acute findings, with atrophy and chronic microvascular ischemic changes. Neurology following. LP contraindicated at this time with coagulopathy (INR 4.3).  Antibiotics initiated. EEG with epileptogenicity in left parieto-occipital region, moderate diffuse encephalopathy nonspecific to etiology, but no seizures. Palliative medicine consultation for goals of care.   Assessment: Acute metabolic encephalopathy Vascular dementia Possible meningitis Hx of CVA Hx of DVT/PE on coumadin Adult failure to thrive  Recommendations/Plan:  After further goals of care discussions, wife wishes for transition to comfort measures only.   DNR/DNI, NO feeding tube. Wife declines LP.   Comfort meds on MAR per attending.   Continue frequent oral care and repositioning.   SW following for hospice facility placement. Wife prefers [facility] facility.   Goals of Care and Additional Recommendations:  Limitations on Scope of Treatment: Full Comfort Care  Code Status: DNR/DNI   Code Status Orders  (From admission, onward)         Start     Ordered   01/08/20 0931  Do not attempt resuscitation (DNR)  Continuous    Question Answer Comment  In the event of cardiac or respiratory ARREST Do not call a "code blue"   In the event of cardiac or respiratory ARREST Do not perform Intubation, CPR, defibrillation or ACLS   In the event of cardiac or respiratory ARREST Use medication by any route, position, wound care, and other measures to relive pain and suffering. May use oxygen, suction and manual treatment of airway obstruction as needed for  comfort.      01/08/20 0932        Code Status History    Date Active Date Inactive Code Status Order ID Comments User Context   01/07/2020 1637 01/08/2020 0932 DNR [id number]  Dorcas Carrow, MD Inpatient   01/06/2020 0935 01/07/2020 1637 Full Code [id number]  Clydie Braun, MD ED   02/16/2017 0422 02/19/2017 1551 Full Code [id number]  Danford, Earl Lites, MD Inpatient   Advance Care Planning Activity    Advance Directive Documentation     Most Recent Value  Type of Advance Directive  Healthcare Power of Attorney  Pre-existing out of facility DNR order (yellow form or pink MOST form)  -  "MOST" Form in Place?  -       Prognosis:  Less than two weeks if not days  Discharge Planning:  Hospice facility  Care plan was discussed with wife, Dr. Jerral Ralph, SW  Thank you for allowing the Palliative Medicine Team to assist in the care of this patient.   Time In: 0945 Time Out: 1055 Total Time 70 Prolonged Time Billed  no      Greater than 50%  of this time was spent counseling and coordinating care related to the above assessment and plan.  Vennie Homans, DNP, FNP-C Palliative Medicine Team  Phone: [phone] Fax: [phone]  Please contact Palliative Medicine Team phone at [phone] for questions and concerns.

## 2020-01-08 NOTE — TOC Progression Note (Addendum)
Transition of Care (TOC) - Progression Note    Patient Details  Name: Logan Valdez. MRN: [id number] Date of Birth: 1946/07/24  Transition of Care [facility]) CM/SW Contact  Doy Hutching, [place] Phone Number: 01/08/2020, 10:57 AM  Clinical Narrative:    CSW spoke with pt wife at bedside. Vennie Homans with PMT also present. Allowed pt wife space to talk about pt and her supports in the community. Pt wife familiar with the set up of hospice home care. Pt wife preference is for , if they do not have a bed she is open for [facility] referral, as we discussed they are managed both by [facility].   Referral made to [facility] with [facility].    Expected Discharge Plan: Hospice Medical Facility Barriers to Discharge: Hospice Bed not available  Expected Discharge Plan and Services Expected Discharge Plan: Hospice Medical Facility In-house Referral: Clinical Social Work, Hospice / Palliative Care Discharge Planning Services: CM Consult Post Acute Care Choice: Hospice Living arrangements for the past 2 months: Single Family Home     Readmission Risk Interventions Readmission Risk Prevention Plan 01/06/2020  Post Dischage Appt Complete  Medication Screening Complete  Transportation Screening Complete  Some recent data might be hidden

## 2020-01-08 NOTE — Progress Notes (Signed)
ANTICOAGULATION CONSULT NOTE  Pharmacy Consult for warfarin Indication: history of DVT   Assessment: 56 yom with history of DVT on warfarin presenting acute encephalopathy, fever. Pharmacy consulted to dose warfarin inpatient.  MD noted No history of recent DVT PE within 6 months INR down to 1.5 s/p Vitamin K 5mg  IV x1 given on 3/3. Hgb low/stable, pltc 120>117 Diet: NPO Patient is unable to take po's, pt remains obtunded per RN.   Palliative care consulted.    PTA warfarin dose: 2.5mg  daily except 5mg  on Monday (last dose 2/27 PTA)  Goal of Therapy:  INR 2-3 Monitor platelets by anticoagulation protocol: Yes   Plan:  No warfarin at this time as patient is unable to take po's.  Will f/u with [facility] to consider discontinuing the warfarin pharmacy consult.  Daily INR Monitor CBC, s/sx bleeding  Thank you  Friday, RPh Clinical Pharmacist 01/08/2020 8:53 AM

## 2020-01-08 NOTE — Progress Notes (Signed)
OT Cancellation Note  Patient Details Name: Logan Valdez. MRN: [id number] DOB: 1946/02/02   Cancelled Treatment:    Reason Eval/Treat Not Completed: Patient's level of consciousness. Consulted with RN. Pt continues to be obtunded. Per recent neurology note, pt unable to follow commands and minimally responsive to noxious stimuli. RN requested therapy to hold this date. Per palliative care note, no decisions have been made yet regarding comfort care. OT will continue to follow acutely.   Peterson Ao 01/08/2020, 7:38 AM

## 2020-01-08 NOTE — Progress Notes (Signed)
Reason for consult: Acute on chronic encephalopathy  Subjective: Patient remains nonverbal, moans to noxious stimulus   ROS: negative except above Examination  Vital signs in last 24 hours: Temp:  [98.9 F (37.2 C)-100.1 F (37.8 C)] 99.5 F (37.5 C) (03/04 0543) Pulse Rate:  [77-92] 77 (03/04 0543) Resp:  [15-18] 17 (03/04 0543) BP: (124-151)/(64-100) 124/100 (03/04 0543) SpO2:  [95 %-98 %] 98 % (03/04 0543)  General: lying in bed CVS: pulse-normal rate and rhythm RS: breathing comfortably Extremities: normal   Neuro: MS: Lethargic, nonverbal, moans to noxious stimulus and does not follow any commands CN: pupils equal and reactive, face symmetric, tongue midline Motor: Withdraws to noxious stimulus Gait: not tested  Basic Metabolic Panel: Recent Labs  Lab 01/05/20 1208 01/05/20 1618 01/06/20 1605 01/07/20 0147 01/08/20 0254  NA 137 139  --  138 139  K 4.6 4.2  --  3.7 3.3*  CL 104  --   --  103 104  CO2 22  --   --  21* 23  GLUCOSE 87  --   --  119* 102*  BUN 12  --   --  17 11  CREATININE 1.05  --   --  1.02 0.86  CALCIUM 8.6*  --   --  8.4* 8.1*  MG  --   --  1.7  --  1.6*  PHOS  --   --   --   --  2.4*    CBC: Recent Labs  Lab 01/05/20 1208 01/05/20 1618 01/06/20 0411 01/07/20 0147 01/08/20 0254  WBC 7.5  --  9.6 11.0* 8.8  NEUTROABS 4.9  --   --   --  5.9  HGB 13.2 12.9* 13.9 13.1 12.1*  HCT 41.3 38.0* 42.0 38.6* 36.1*  MCV 96.5  --  94.4 92.1 92.6  PLT 116*  --  132* 120* 117*     Coagulation Studies: Recent Labs    01/06/20 1036 01/07/20 0147 01/08/20 0254  LABPROT 33.9* 41.4* 18.1*  INR 3.4* 4.3* 1.5*       ASSESSMENT AND PLAN   36 y male wit dementia, right PCA stroke, history of PE and DVT on Coumadin presents with 2 days progressive lethargy,noted to be febrile and have neck stiffness-neurology consulted for possible meningitis evaluation.No other source of fever identified.  Ammonia 17.  VPA level 12 ( depakote for  mood, 125mg  daily) .   EEG was performed yesterday-showed no epileptiform discharges/seizures.  MRI brain without contrast performed today shows no acute findings.  Patient has not shown significant improvement despite broad-spectrum empiric antibiotics/acyclovir.  Neck remains rigid/tender-unclear if this is from meningismus versus cervicalgia. Fever has improved- had recorded temp is 100.1  Acute on chronic encephalopathy Delirium on dementia Possible meningitis   Arrived to room, discussed with wife regarding plans for LP. However, patient's wife refused, said she was leaning towards  comfort care after discussion with palliative care team.  Patient is currently DNR/DNI.  I explained to her an LP would help rule out meningitis so we can minimize antibiotics/discontinuing acyclovir-as I am not certain the patient does have meningitis.  Patient's wife aware, but states that she does not want to do anything that this painful. Patient has history of dementia and has been declining even prior to admission, and has been having issues with pain even prior to admission including chronic back pain.  Would recommend minimizing antibiotics/restarting Depakote. Neurology will be available as needed.    Tamra Koos [place] 03-06-2004  Number [phone] For questions after 7pm please refer to AMION to reach the Neurologist on call

## 2020-01-08 NOTE — Progress Notes (Signed)
PROGRESS NOTE    Logan Valdez.  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  According to the reports, at baseline patient has been able to walk few weeks ago.  He was looking more lethargic and more confused.  Also had few falls at home.  Patient was noted to be sleeping more and not eating or taking any of his medications.  Due to these symptoms she called EMS and patient was brought to the ER.  In the emergency department, temperature was 101.1.  Blood pressure was normal.  INR 3.5.  Bilirubin 2.4.  Ammonia 17.  Urinalysis was without evidence of infection.  Chest x-ray was clear.  CT scan of the head and spine did not show any acute injuries.  CT scan of the abdomen and pelvis revealed bladder wall thickening.  Patient was started on empiric antibiotics and admitted.   Assessment & Plan:   Principal Problem:   Acute encephalopathy Active Problems:   Obesity (BMI 30.0-34.9)   Lumbar radiculopathy   HTN (hypertension)   Vascular dementia (HCC)   Fever   Encounter for end of life care   Supratherapeutic INR   Thrombocytopenia (HCC)   Palliative care by specialist   Generalized weakness  Acute metabolic encephalopathy in the setting of underlying vascular dementia: Suspect systemic infection versus CNS infection with meningitis. Persistent encephalopathic state with no meaningful recovery. Underlying severe dementia and multiple medical problems.  Patient remains persistently encephalopathic with no or minimal chances of recovery.  He has poor underlying functional status and oral intake.  Conservative treatment for last 3 days with no meaningful improvement on his  mentation.  Patient had desired no artificial feeding or artificial airway. Due to progressive decline and poor chances of meaningful recovery, hospice and comfort care was desired.  Plan: No further lab draws.  No further invasive intervention. All comfort care medications including IV narcotics, anxiolytics, antisecretory and antinausea medications available. RN to pronounce death if occurs in the hospital. Look for hospice facilities for inpatient hospice transfer. Until patient gets inpatient hospice bed, we will provide him with all available end-of-life care.  DVT prophylaxis: SCDs Code Status: Comfort care Family Communication: Wife at the bedside/daughter on the phone Disposition Plan: patient is from home.. Anticipated DC to inpatient hospice when bed available..   Consultants:   Neurology  Palliative medicine  Procedures:   None  Antimicrobials:  Anti-infectives (From admission, onward)   Start     Dose/Rate Route Frequency Ordered Stop   01/07/20 2000  vancomycin (VANCOREADY) IVPB 1250 mg/250 mL  Status:  Discontinued     1,250 mg 166.7 mL/hr over 90 Minutes Intravenous Every 24 hours 01/06/20 1656 01/07/20 1125   01/07/20 1200  vancomycin (VANCOCIN) IVPB 1000 mg/200 mL premix  Status:  Discontinued     1,000 mg 200 mL/hr over 60 Minutes Intravenous Every 12 hours 01/07/20 1125 01/08/20 0929   01/07/20 0630  cefTRIAXone (ROCEPHIN) 1 g in sodium chloride 0.9 % 100 mL IVPB  Status:  Discontinued     1 g 200 mL/hr over 30 Minutes Intravenous Every 24 hours 01/06/20 0935 01/06/20 1628   01/06/20 2200  ampicillin (OMNIPEN) 2 g in sodium chloride 0.9 % 100 mL  IVPB  Status:  Discontinued     2 g 300 mL/hr over 20 Minutes Intravenous Every 4 hours 01/06/20 1656 01/08/20 0929   01/06/20 2100  acyclovir (ZOVIRAX) 780 mg in dextrose 5 % 150 mL IVPB  Status:  Discontinued     10 mg/kg  78.1 kg (Adjusted) 165.6 mL/hr over 60 Minutes Intravenous Every 8 hours 01/06/20 2000  01/08/20 0929   01/06/20 1730  cefTRIAXone (ROCEPHIN) 2 g in sodium chloride 0.9 % 100 mL IVPB  Status:  Discontinued     2 g 200 mL/hr over 30 Minutes Intravenous Every 12 hours 01/06/20 1628 01/08/20 0929   01/06/20 1730  ampicillin (OMNIPEN) 2 g in sodium chloride 0.9 % 100 mL IVPB     2 g 300 mL/hr over 20 Minutes Intravenous STAT 01/06/20 1628 01/06/20 1831   01/06/20 1730  vancomycin (VANCOREADY) IVPB 2000 mg/400 mL     2,000 mg 200 mL/hr over 120 Minutes Intravenous  Once 01/06/20 1642 01/06/20 2038   01/06/20 1630  vancomycin (VANCOCIN) IVPB 1000 mg/200 mL premix  Status:  Discontinued     1,000 mg 200 mL/hr over 60 Minutes Intravenous  Once 01/06/20 1628 01/06/20 1642   01/06/20 0615  cefTRIAXone (ROCEPHIN) 1 g in sodium chloride 0.9 % 100 mL IVPB     1 g 200 mL/hr over 30 Minutes Intravenous  Once 01/06/20 0606 01/06/20 0718         Subjective: Patient seen and examined early morning.  Wife was at the bedside. Patient himself is been persistently obtunded, he moans on noxious stimuli.  Objective: Vitals:   01/07/20 1700 01/07/20 2252 01/07/20 2252 01/08/20 0543  BP:  129/64 129/64 (!) 124/100  Pulse:  84 84 77  Resp:  15 15 17   Temp: 98.9 F (37.2 C) 100 F (37.8 C) 100 F (37.8 C) 99.5 F (37.5 C)  TempSrc: Axillary Axillary Oral Axillary  SpO2:  95% 95% 98%  Weight:      Height:        Intake/Output Summary (Last 24 hours) at 01/08/2020 1414 Last data filed at 01/08/2020 0900 Gross per 24 hour  Intake 1618 ml  Output 925 ml  Net 693 ml   Filed Weights   01/06/20 1147  Weight: 96.2 kg    Examination: Physical Exam  Constitutional:  Sick looking gentleman, obtunded with only responds to painful stimuli.  HENT:  Head: Normocephalic.  Neck:  rigid  Cardiovascular: Normal rate.  Pulmonary/Chest:  Conducted airway sounds.  Neurological:  Obtunded, lethargic, not following commands.    Data Reviewed: I have personally reviewed following labs and  imaging studies  CBC: Recent Labs  Lab 01/05/20 1208 01/05/20 1618 01/06/20 0411 01/07/20 0147 01/08/20 0254  WBC 7.5  --  9.6 11.0* 8.8  NEUTROABS 4.9  --   --   --  5.9  HGB 13.2 12.9* 13.9 13.1 12.1*  HCT 41.3 38.0* 42.0 38.6* 36.1*  MCV 96.5  --  94.4 92.1 92.6  PLT 116*  --  132* 120* 117*   Basic Metabolic Panel: Recent Labs  Lab 01/05/20 1208 01/05/20 1618 01/06/20 1605 01/07/20 0147 01/08/20 0254  NA 137 139  --  138 139  K 4.6 4.2  --  3.7 3.3*  CL 104  --   --  103 104  CO2 22  --   --  21* 23  GLUCOSE 87  --   --  119* 102*  BUN 12  --   --  17 11  CREATININE 1.05  --   --  1.02 0.86  CALCIUM 8.6*  --   --  8.4* 8.1*  MG  --   --  1.7  --  1.6*  PHOS  --   --   --   --  2.4*   GFR: Estimated Creatinine Clearance: 83.2 mL/min (by C-G formula based on SCr of 0.86 mg/dL). Liver Function Tests: Recent Labs  Lab 01/05/20 1208 01/08/20 0254  AST 38 46*  ALT 25 25  ALKPHOS 49 42  BILITOT 2.4* 1.7*  PROT 5.9* 5.3*  ALBUMIN 3.3* 2.6*   No results for input(s): LIPASE, AMYLASE in the last 168 hours. Recent Labs  Lab 01/05/20 1613  AMMONIA 17   Coagulation Profile: Recent Labs  Lab 01/04/20 0000 01/06/20 1036 01/07/20 0147 01/08/20 0254  INR 3.5* 3.4* 4.3* 1.5*   Cardiac Enzymes: No results for input(s): CKTOTAL, CKMB, CKMBINDEX, TROPONINI in the last 168 hours. BNP (last 3 results) No results for input(s): PROBNP in the last 8760 hours. HbA1C: No results for input(s): HGBA1C in the last 72 hours. CBG: Recent Labs  Lab 01/05/20 1206  GLUCAP 75   Lipid Profile: No results for input(s): CHOL, HDL, LDLCALC, TRIG, CHOLHDL, LDLDIRECT in the last 72 hours. Thyroid Function Tests: Recent Labs    01/06/20 1036  TSH 1.535   Anemia Panel: No results for input(s): VITAMINB12, FOLATE, FERRITIN, TIBC, IRON, RETICCTPCT in the last 72 hours. Sepsis Labs: Recent Labs  Lab 01/06/20 0411 01/06/20 1036  PROCALCITON  --  <0.10  LATICACIDVEN 1.3   --     Recent Results (from the past 240 hour(s))  Culture, Urine     Status: None   Collection Time: 01/05/20  5:19 PM   Specimen: Urine, Random  Result Value Ref Range Status   Specimen Description URINE, RANDOM  Final   Special Requests NONE  Final   Culture   Final    NO GROWTH Performed at [facility], [address], [place], [place] [address]    Report Status 01/07/2020 FINAL  Final  SARS CORONAVIRUS 2 (TAT 6-24 HRS)     Status: None   Collection Time: 01/06/20  4:50 AM  Result Value Ref Range Status   SARS Coronavirus 2 NEGATIVE NEGATIVE Final    Comment: (NOTE) SARS-CoV-2 target nucleic acids are NOT DETECTED. The SARS-CoV-2 RNA is generally detectable in upper and lower respiratory specimens during the acute phase of infection. Negative results do not preclude SARS-CoV-2 infection, do not rule out co-infections with other pathogens, and should not be used as the sole basis for treatment or other patient management decisions. Negative results must be combined with clinical observations, patient history, and epidemiological information. The expected result is Negative. Fact Sheet for Patients: HairSlick.no Fact Sheet for Healthcare Providers: quierodirigir.com This test is not yet approved or cleared by the [place] FDA and  has been authorized for detection and/or diagnosis of SARS-CoV-2 by FDA under an Emergency Use Authorization (EUA). This EUA will remain  in effect (meaning this test can be used) for the duration of the COVID-19 declaration under Section 56 4(b)(1) of the Act, 21 U.S.C. section 360bbb-3(b)(1), unless the authorization is terminated or revoked sooner. Performed at [facility], [address], [place], [place] [address]   Respiratory Panel by PCR     Status: None   Collection Time: 01/06/20  9:30 AM   Specimen: Nasopharyngeal Swab; Respiratory  Result Value Ref Range  Status   Adenovirus NOT DETECTED NOT DETECTED Final   Coronavirus 229E NOT DETECTED NOT DETECTED Final    Comment: (NOTE) The Coronavirus on the Respiratory Panel, DOES NOT test for the novel  Coronavirus (2019 nCoV)    Coronavirus HKU1 NOT DETECTED NOT DETECTED Final   Coronavirus NL63 NOT DETECTED NOT DETECTED Final   Coronavirus OC43 NOT DETECTED NOT DETECTED Final   Metapneumovirus NOT DETECTED NOT DETECTED Final   Rhinovirus / Enterovirus NOT DETECTED NOT DETECTED Final   Influenza A NOT DETECTED NOT DETECTED Final   Influenza B NOT DETECTED NOT DETECTED Final   Parainfluenza Virus 1 NOT DETECTED NOT DETECTED Final   Parainfluenza Virus 2 NOT DETECTED NOT DETECTED Final   Parainfluenza Virus 3 NOT DETECTED NOT DETECTED Final   Parainfluenza Virus 4 NOT DETECTED NOT DETECTED Final   Respiratory Syncytial Virus NOT DETECTED NOT DETECTED Final   Bordetella pertussis NOT DETECTED NOT DETECTED Final   Chlamydophila pneumoniae NOT DETECTED NOT DETECTED Final   Mycoplasma pneumoniae NOT DETECTED NOT DETECTED Final    Comment: Performed at [facility], [place] [address], [place], [place] [address]  Culture, blood (routine x 2)     Status: None (Preliminary result)   Collection Time: 01/06/20 10:19 AM   Specimen: BLOOD RIGHT HAND  Result Value Ref Range Status   Specimen Description BLOOD RIGHT HAND  Final   Special Requests   Final    BOTTLES DRAWN AEROBIC AND ANAEROBIC Blood Culture results may not be optimal due to an inadequate volume of blood received in culture bottles   Culture   Final    NO GROWTH 2 DAYS Performed at [facility], [place] [address] [place], [place] [address]    Report Status PENDING  Incomplete  Culture, blood (routine x 2)     Status: None (Preliminary result)   Collection Time: 01/06/20 10:19 AM   Specimen: BLOOD  Result Value Ref Range Status   Specimen Description BLOOD RIGHT ANTECUBITAL  Final   Special Requests   Final    BOTTLES DRAWN  AEROBIC AND ANAEROBIC Blood Culture adequate volume   Culture   Final    NO GROWTH 2 DAYS Performed at [facility], [place] [address], [place], [place] [address]    Report Status PENDING  Incomplete         Radiology Studies: MR BRAIN WO CONTRAST  Result Date: 01/07/2020 CLINICAL DATA:  Initial evaluation for acute encephalopathy, weakness and confusion. EXAM: MRI HEAD WITHOUT CONTRAST TECHNIQUE: Multiplanar, multiecho pulse sequences of the brain and surrounding structures were obtained without intravenous contrast. COMPARISON:  Comparison made with prior CT from 01/05/2020 as well as previous MRI from 08/28/2008. FINDINGS: Brain: Diffuse prominence of the CSF containing spaces compatible with generalized age-related cerebral atrophy. Mild scattered patchy T2/FLAIR hyperintensity within the periventricular deep white matter both cerebral hemispheres most likely related chronic microvascular disease, mild for age. Encephalomalacia and gliosis within the left temporal occipital region consistent with chronic left PCA territory infarct. No abnormal foci of restricted diffusion to suggest acute or subacute ischemia. Gray-white matter differentiation maintained. No other areas of remote cortical infarction. No foci of susceptibility artifact to suggest acute or chronic intracranial hemorrhage. No mass lesion, midline shift or mass effect. Mild ventricular prominence related global parenchymal volume loss without hydrocephalus. No extra-axial fluid collection. Pituitary gland suprasellar region within normal limits. Midline structures intact. Vascular: Major intracranial vascular flow voids are maintained. Skull and upper cervical spine:  Craniocervical junction normal. Upper cervical spine within normal limits. Bone marrow signal intensity normal. No scalp soft tissue abnormality. Sinuses/Orbits: Globes and orbital soft tissues within normal limits. Mild scattered mucosal thickening noted within the  ethmoidal air cells and sphenoid sinuses. Paranasal sinuses are otherwise clear. No significant mastoid effusion. Inner ear structures grossly normal. Other: None. IMPRESSION: 1. No acute intracranial abnormality. 2. Chronic left PCA territory infarct. 3. Underlying age-related cerebral atrophy with mild chronic microvascular ischemic disease. Electronically Signed   By: Rise Mu M.D.   On: 01/07/2020 01:47        Scheduled Meds: . dexamethasone (DECADRON) injection  10 mg Intravenous Once   Continuous Infusions: . sodium chloride 10 mL/hr at 01/08/20 1237     LOS: 2 days    Time spent: 35 minutes    Dorcas Carrow, MD [facility] Pager [phone]

## 2020-01-08 NOTE — Progress Notes (Signed)
Manufacturing systems engineer [facility]) Hospital Liaison note.    Received request from [facility] manager Octavio Graves, CSW  for family interest in [facility]. Spoke with wife [name] to confirm interest and explain services. Patient's information has been forwarded to [facility] for eligibility review. Hospital liaison will notify [facility] manager and family if room is available.   A Please do not hesitate to call with questions.    Thank you,    Elsie Saas, RN, CCM      [facility] Liaison (listed on [facility] under [facility])    [phone]

## 2020-01-08 NOTE — Progress Notes (Signed)
Full note to follow . Discussed with wife at bedside and daughter Marcelino Duster on phone. All family agreed for comfort care and hospice as he is not having any meaningful recovery.   Plan: comfort care orders. No lab draws, stop antibiotics. Consult hospice for inpatient hospice care

## 2020-01-08 NOTE — Plan of Care (Signed)
  Problem: Clinical Measurements: Goal: Respiratory complications will improve Outcome: Progressing Goal: Cardiovascular complication will be avoided Outcome: Progressing   

## 2020-01-09 MED ORDER — CHLORHEXIDINE GLUCONATE CLOTH 2 % EX PADS: 6.0000 | MEDICATED_PAD | Freq: Every day | CUTANEOUS | Status: DC

## 2020-01-09 MED ORDER — CHLORHEXIDINE GLUCONATE 0.12 % MT SOLN
15.0000 mL | Freq: Two times a day (BID) | OROMUCOSAL | Status: DC
  Administered 2020-01-09 (×2): 15 mL via OROMUCOSAL

## 2020-01-09 MED ORDER — ORAL CARE MOUTH RINSE
15.0000 mL | Freq: Two times a day (BID) | OROMUCOSAL | Status: DC
  Administered 2020-01-09 (×2): 15 mL via OROMUCOSAL

## 2020-01-09 NOTE — Progress Notes (Signed)
Civil engineer, contracting [facility]) Hospital Liaison Note  Spoke to patient's spouse [name] to confirm continued interest in [facility] and offer a bed for this evening (to arrive after 8:30pm). Answered questions and supported patient spouse. Spouse will complete registration paperwork this afternoon with [facility] SW and will update this note once complete. CM/TOC made aware of above.  Please have bedside nurse call report to [facility] at [phone].  And Please fax discharge summary to [phone].  Thank you,  Roda Shutters, RN [facility] (on [place]) [phone]

## 2020-01-09 NOTE — Progress Notes (Signed)
Report called to karen at [facility]

## 2020-01-09 NOTE — Progress Notes (Signed)
Pt discharged per [facility]. Pt to travel with peripheral IV's on both arms, both saline locked and with coude catheter. Pt's vitals within normal limits. Pt given pain med prior to transport.

## 2020-01-09 NOTE — Progress Notes (Signed)
PROGRESS NOTE    Logan Valdez.  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  According to the reports, at baseline patient has been able to walk few weeks ago.  He was looking more lethargic and more confused.  Also had few falls at home.  Patient was noted to be sleeping more and not eating or taking any of his medications.  Due to these symptoms she called EMS and patient was brought to the ER.  In the emergency department, temperature was 101.1.  Blood pressure was normal.  INR 3.5.  Bilirubin 2.4.  Ammonia 17.  Urinalysis was without evidence of infection.  Chest x-ray was clear.  CT scan of the head and spine did not show any acute injuries.  CT scan of the abdomen and pelvis revealed bladder wall thickening.  Patient was started on empiric antibiotics and admitted.   Assessment & Plan:   Principal Problem:   Acute encephalopathy Active Problems:   Obesity (BMI 30.0-34.9)   Lumbar radiculopathy   HTN (hypertension)   Vascular dementia (HCC)   Fever   Encounter for end of life care   Supratherapeutic INR   Thrombocytopenia (HCC)   Palliative care by specialist   Generalized weakness  Acute metabolic encephalopathy in the setting of underlying vascular dementia: Suspect systemic infection versus CNS infection with meningitis. Persistent encephalopathic state with no meaningful recovery. Underlying severe dementia and multiple medical problems.  Patient remains persistently encephalopathic with no or minimal chances of recovery.  He has poor underlying functional status and oral intake.  Conservative treatment for last 3 days with no meaningful improvement on his  mentation.  Patient had desired no artificial feeding or artificial airway. Due to progressive decline and poor chances of meaningful recovery, hospice and comfort care was desired.  Plan: No further lab draws.  No further invasive intervention. All comfort care medications including IV narcotics, anxiolytics, antisecretory and antinausea medications available. RN to pronounce death if occurs in the hospital. Look for hospice facilities for inpatient hospice transfer. Until patient gets inpatient hospice bed, we will provide him with all available end-of-life care.  DVT prophylaxis: SCDs Code Status: Comfort care Family Communication:  Disposition Plan: patient is from home.. Anticipated DC to inpatient hospice when bed available..   Consultants:   Neurology  Palliative medicine  Procedures:   None  Antimicrobials:  Anti-infectives (From admission, onward)   Start     Dose/Rate Route Frequency Ordered Stop   01/07/20 2000  vancomycin (VANCOREADY) IVPB 1250 mg/250 mL  Status:  Discontinued     1,250 mg 166.7 mL/hr over 90 Minutes Intravenous Every 24 hours 01/06/20 1656 01/07/20 1125   01/07/20 1200  vancomycin (VANCOCIN) IVPB 1000 mg/200 mL premix  Status:  Discontinued     1,000 mg 200 mL/hr over 60 Minutes Intravenous Every 12 hours 01/07/20 1125 01/08/20 0929   01/07/20 0630  cefTRIAXone (ROCEPHIN) 1 g in sodium chloride 0.9 % 100 mL IVPB  Status:  Discontinued     1 g 200 mL/hr over 30 Minutes Intravenous Every 24 hours 01/06/20 0935 01/06/20 1628   01/06/20 2200  ampicillin (OMNIPEN) 2 g in sodium chloride 0.9 % 100 mL IVPB  Status:  Discontinued  2 g 300 mL/hr over 20 Minutes Intravenous Every 4 hours 01/06/20 1656 01/08/20 0929   01/06/20 2100  acyclovir (ZOVIRAX) 780 mg in dextrose 5 % 150 mL IVPB  Status:  Discontinued     10 mg/kg  78.1 kg (Adjusted) 165.6 mL/hr over 60 Minutes Intravenous Every 8 hours 01/06/20 2000 01/08/20 0929   01/06/20 1730  cefTRIAXone  (ROCEPHIN) 2 g in sodium chloride 0.9 % 100 mL IVPB  Status:  Discontinued     2 g 200 mL/hr over 30 Minutes Intravenous Every 12 hours 01/06/20 1628 01/08/20 0929   01/06/20 1730  ampicillin (OMNIPEN) 2 g in sodium chloride 0.9 % 100 mL IVPB     2 g 300 mL/hr over 20 Minutes Intravenous STAT 01/06/20 1628 01/06/20 1831   01/06/20 1730  vancomycin (VANCOREADY) IVPB 2000 mg/400 mL     2,000 mg 200 mL/hr over 120 Minutes Intravenous  Once 01/06/20 1642 01/06/20 2038   01/06/20 1630  vancomycin (VANCOCIN) IVPB 1000 mg/200 mL premix  Status:  Discontinued     1,000 mg 200 mL/hr over 60 Minutes Intravenous  Once 01/06/20 1628 01/06/20 1642   01/06/20 0615  cefTRIAXone (ROCEPHIN) 1 g in sodium chloride 0.9 % 100 mL IVPB     1 g 200 mL/hr over 30 Minutes Intravenous  Once 01/06/20 0606 01/06/20 0718         Subjective: Patient seen and examined.  Obtunded, recently received IV Dilaudid.  Looks comfortable.  Unable to interact.  Objective: Vitals:   01/07/20 2252 01/08/20 0543 01/08/20 1447 01/08/20 2143  BP: 129/64 (!) 124/100 (!) 153/60 (!) 134/114  Pulse: 84 77 67 81  Resp: 15 17 (!) 22 18  Temp: 100 F (37.8 C) 99.5 F (37.5 C) 99 F (37.2 C) 99.3 F (37.4 C)  TempSrc: Oral Axillary Oral   SpO2: 95% 98% 100% 97%  Weight:      Height:        Intake/Output Summary (Last 24 hours) at 01/09/2020 1306 Last data filed at 01/09/2020 0408 Gross per 24 hour  Intake 462.29 ml  Output 650 ml  Net -187.71 ml   Filed Weights   01/06/20 1147  Weight: 96.2 kg    Examination: Physical Exam  Constitutional:  Sick looking gentleman, obtunded, not responding to painful stimuli.  Neck:  rigid  Cardiovascular: Normal rate.  Pulmonary/Chest:  Upper airway sounds.  Comfortable on room air.  Neurological:  Obtunded, lethargic, not following commands.    Data Reviewed: I have personally reviewed following labs and imaging studies  CBC: Recent Labs  Lab 01/05/20 1208 01/05/20  1618 01/06/20 0411 01/07/20 0147 01/08/20 0254  WBC 7.5  --  9.6 11.0* 8.8  NEUTROABS 4.9  --   --   --  5.9  HGB 13.2 12.9* 13.9 13.1 12.1*  HCT 41.3 38.0* 42.0 38.6* 36.1*  MCV 96.5  --  94.4 92.1 92.6  PLT 116*  --  132* 120* 117*   Basic Metabolic Panel: Recent Labs  Lab 01/05/20 1208 01/05/20 1618 01/06/20 1605 01/07/20 0147 01/08/20 0254  NA 137 139  --  138 139  K 4.6 4.2  --  3.7 3.3*  CL 104  --   --  103 104  CO2 22  --   --  21* 23  GLUCOSE 87  --   --  119* 102*  BUN 12  --   --  17 11  CREATININE 1.05  --   --  1.02  0.86  CALCIUM 8.6*  --   --  8.4* 8.1*  MG  --   --  1.7  --  1.6*  PHOS  --   --   --   --  2.4*   GFR: Estimated Creatinine Clearance: 83.2 mL/min (by C-G formula based on SCr of 0.86 mg/dL). Liver Function Tests: Recent Labs  Lab 01/05/20 1208 01/08/20 0254  AST 38 46*  ALT 25 25  ALKPHOS 49 42  BILITOT 2.4* 1.7*  PROT 5.9* 5.3*  ALBUMIN 3.3* 2.6*   No results for input(s): LIPASE, AMYLASE in the last 168 hours. Recent Labs  Lab 01/05/20 1613  AMMONIA 17   Coagulation Profile: Recent Labs  Lab 01/04/20 0000 01/06/20 1036 01/07/20 0147 01/08/20 0254  INR 3.5* 3.4* 4.3* 1.5*   Cardiac Enzymes: No results for input(s): CKTOTAL, CKMB, CKMBINDEX, TROPONINI in the last 168 hours. BNP (last 3 results) No results for input(s): PROBNP in the last 8760 hours. HbA1C: No results for input(s): HGBA1C in the last 72 hours. CBG: Recent Labs  Lab 01/05/20 1206  GLUCAP 75   Lipid Profile: No results for input(s): CHOL, HDL, LDLCALC, TRIG, CHOLHDL, LDLDIRECT in the last 72 hours. Thyroid Function Tests: No results for input(s): TSH, T4TOTAL, FREET4, T3FREE, THYROIDAB in the last 72 hours. Anemia Panel: No results for input(s): VITAMINB12, FOLATE, FERRITIN, TIBC, IRON, RETICCTPCT in the last 72 hours. Sepsis Labs: Recent Labs  Lab 01/06/20 0411 01/06/20 1036  PROCALCITON  --  <0.10  LATICACIDVEN 1.3  --     Recent Results  (from the past 240 hour(s))  Culture, Urine     Status: None   Collection Time: 01/05/20  5:19 PM   Specimen: Urine, Random  Result Value Ref Range Status   Specimen Description URINE, RANDOM  Final   Special Requests NONE  Final   Culture   Final    NO GROWTH Performed at [facility], [address] [place], [place] [address]    Report Status 01/07/2020 FINAL  Final  SARS CORONAVIRUS 2 (TAT 6-24 HRS)     Status: None   Collection Time: 01/06/20  4:50 AM  Result Value Ref Range Status   SARS Coronavirus 2 NEGATIVE NEGATIVE Final    Comment: (NOTE) SARS-CoV-2 target nucleic acids are NOT DETECTED. The SARS-CoV-2 RNA is generally detectable in upper and lower respiratory specimens during the acute phase of infection. Negative results do not preclude SARS-CoV-2 infection, do not rule out co-infections with other pathogens, and should not be used as the sole basis for treatment or other patient management decisions. Negative results must be combined with clinical observations, patient history, and epidemiological information. The expected result is Negative. Fact Sheet for Patients: SugarRoll.be Fact Sheet for Healthcare Providers: https://www.woods-mathews.com/ This test is not yet approved or cleared by the [place] FDA and  has been authorized for detection and/or diagnosis of SARS-CoV-2 by FDA under an Emergency Use Authorization (EUA). This EUA will remain  in effect (meaning this test can be used) for the duration of the COVID-19 declaration under Section 56 4(b)(1) of the Act, 21 U.S.C. section 360bbb-3(b)(1), unless the authorization is terminated or revoked sooner. Performed at [facility], [place] [address] [place], [place] [address]   Respiratory Panel by PCR     Status: None   Collection Time: 01/06/20  9:30 AM   Specimen: Nasopharyngeal Swab; Respiratory  Result Value Ref Range Status   Adenovirus NOT  DETECTED NOT DETECTED Final  Coronavirus 229E NOT DETECTED NOT DETECTED Final    Comment: (NOTE) The Coronavirus on the Respiratory Panel, DOES NOT test for the novel  Coronavirus (2019 nCoV)    Coronavirus HKU1 NOT DETECTED NOT DETECTED Final   Coronavirus NL63 NOT DETECTED NOT DETECTED Final   Coronavirus OC43 NOT DETECTED NOT DETECTED Final   Metapneumovirus NOT DETECTED NOT DETECTED Final   Rhinovirus / Enterovirus NOT DETECTED NOT DETECTED Final   Influenza A NOT DETECTED NOT DETECTED Final   Influenza B NOT DETECTED NOT DETECTED Final   Parainfluenza Virus 1 NOT DETECTED NOT DETECTED Final   Parainfluenza Virus 2 NOT DETECTED NOT DETECTED Final   Parainfluenza Virus 3 NOT DETECTED NOT DETECTED Final   Parainfluenza Virus 4 NOT DETECTED NOT DETECTED Final   Respiratory Syncytial Virus NOT DETECTED NOT DETECTED Final   Bordetella pertussis NOT DETECTED NOT DETECTED Final   Chlamydophila pneumoniae NOT DETECTED NOT DETECTED Final   Mycoplasma pneumoniae NOT DETECTED NOT DETECTED Final    Comment: Performed at [facility], [address], [place], [place] [address]  Culture, blood (routine x 2)     Status: None (Preliminary result)   Collection Time: 01/06/20 10:19 AM   Specimen: BLOOD RIGHT HAND  Result Value Ref Range Status   Specimen Description BLOOD RIGHT HAND  Final   Special Requests   Final    BOTTLES DRAWN AEROBIC AND ANAEROBIC Blood Culture results may not be optimal due to an inadequate volume of blood received in culture bottles   Culture   Final    NO GROWTH 3 DAYS Performed at [facility], [address], [place], [place] [address]    Report Status PENDING  Incomplete  Culture, blood (routine x 2)     Status: None (Preliminary result)   Collection Time: 01/06/20 10:19 AM   Specimen: BLOOD  Result Value Ref Range Status   Specimen Description BLOOD RIGHT ANTECUBITAL  Final   Special Requests   Final    BOTTLES DRAWN AEROBIC AND ANAEROBIC Blood  Culture adequate volume   Culture   Final    NO GROWTH 3 DAYS Performed at [facility], [address], [place], [place] [address]    Report Status PENDING  Incomplete         Radiology Studies: No results found.      Scheduled Meds: . chlorhexidine  15 mL Mouth Rinse BID  . Chlorhexidine Gluconate Cloth  6 each Topical Daily  . dexamethasone (DECADRON) injection  10 mg Intravenous Once  . mouth rinse  15 mL Mouth Rinse q12n4p   Continuous Infusions: . sodium chloride 20 mL/hr at 01/08/20 1500     LOS: 3 days    Time spent: 30 minutes    Dorcas Carrow, MD [facility] Pager [phone]

## 2020-01-09 NOTE — Discharge Summary (Signed)
Physician Discharge Summary  Logan FlankEdward C [name] DOB: May 09, 1946 DOA: 01/05/2020  PCP: Eustaquio BoydenGutierrez, Javier, MD  Admit date: 01/05/2020 Discharge date: 01/09/2020  Admitted From: Home Disposition: Inpatient hospice   Discharge Condition: Serious CODE STATUS: DNR, comfort care Diet recommendation: NPO.  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  According to the reports, at baseline patient has been able to walk few weeks ago.  He was looking more lethargic and more confused.  Also had few falls at home.  Patient was noted to be sleeping more and not eating or taking any of his medications.  Due to these symptoms she called EMS and patient was brought to the ER.  In the emergency department, temperature was 101.1.  Blood pressure was normal.  INR 3.5.  Bilirubin 2.4.  Ammonia 17.  Urinalysis was without evidence of infection.  Chest x-ray was clear.  CT scan of the head and spine did not show any acute injuries.  CT scan of the abdomen and pelvis revealed bladder wall thickening.  Patient was started on empiric antibiotics and admitted.  Acute metabolic encephalopathy in the setting of underlying vascular dementia: Suspect systemic infection versus CNS infection with meningitis. Persistent encephalopathic state with no meaningful recovery. Underlying severe dementia and multiple medical problems.  Patient remained persistently encephalopathic with no or minimal chances of recovery.  He has poor underlying functional status and oral intake.  Conservative treatment with no meaningful improvement on his mentation.  Patient had desired no artificial feeding or artificial airway. Due to progressive decline and poor chances of meaningful recovery,  hospice and comfort care was desired.  Plan: No further lab draws.  No further invasive intervention. All comfort care medications including IV narcotics, anxiolytics, antisecretory and antinausea medications available. Patient will be medicated for comfort. Bed available at inpatient hospice, transferred today. Until patient gets inpatient hospice bed, we will provide him with all available end-of-life care.   Discharge Diagnoses:  Principal Problem:   Acute encephalopathy Active Problems:   Obesity (BMI 30.0-34.9)   Lumbar radiculopathy   HTN (hypertension)   Vascular dementia (HCC)   Fever   Encounter for end of life care   Supratherapeutic INR   Thrombocytopenia (HCC)   Palliative care by specialist   Generalized weakness    Discharge Instructions   Allergies as of 01/09/2020      Reactions   Hydrochlorothiazide Other (See Comments)   Mental changes    Indomethacin    syncope   Pravastatin Sodium    REACTION: rash 11/09   Exelon [rivastigmine Tartrate] Other (See Comments)   Had watery mucousy diarrhea, and skin reaction.   Lisinopril    REACTION: HA   Micardis [telmisartan]    REACTION: HA      Medication List    STOP taking these medications   acetaminophen 500 MG tablet Commonly known as: TYLENOL   amLODipine 2.5 MG tablet Commonly known as: NORVASC   atorvastatin 40 MG tablet Commonly known as: LIPITOR   bacitracin ointment   clotrimazole 1 % cream Commonly known as: LOTRIMIN   Delsym 30 MG/5ML liquid Generic drug: dextromethorphan   divalproex 125 MG capsule Commonly known as: DEPAKOTE SPRINKLE   docusate sodium 100 MG capsule Commonly known as: COLACE   finasteride 5 MG tablet Commonly known  as: PROSCAR   FOLIC ACID PO   gabapentin 297 MG capsule Commonly known as: NEURONTIN   guaiFENesin 600 MG 12 hr tablet Commonly known as: MUCINEX   ipratropium-albuterol 0.5-2.5 (3) MG/3ML Soln Commonly known as: DUONEB   LORazepam 0.5  MG tablet Commonly known as: ATIVAN   memantine 10 MG tablet Commonly known as: NAMENDA   metoprolol tartrate 25 MG tablet Commonly known as: LOPRESSOR   polyethylene glycol powder 17 GM/SCOOP powder Commonly known as: GLYCOLAX/MIRALAX   Tegaderm Hydrocolloid Misc   triamcinolone cream 0.1 % Commonly known as: KENALOG   vitamin B-12 500 MCG tablet Commonly known as: CYANOCOBALAMIN   VITAMIN B-6 PO   warfarin 5 MG tablet Commonly known as: COUMADIN       Allergies  Allergen Reactions  . Hydrochlorothiazide Other (See Comments)    Mental changes   . Indomethacin     syncope  . Pravastatin Sodium     REACTION: rash 11/09  . Exelon [Rivastigmine Tartrate] Other (See Comments)    Had watery mucousy diarrhea, and skin reaction.  . Lisinopril     REACTION: HA  . Micardis [Telmisartan]     REACTION: HA    Consultations:  Neurology  Palliative medicine   Procedures/Studies: DG Chest 2 View  Result Date: 01/05/2020 CLINICAL DATA:  Altered mental status, weakness EXAM: CHEST - 2 VIEW COMPARISON:  02/16/2017 FINDINGS: The heart size and mediastinal contours are within normal limits. Both lungs are clear. Disc degenerative disease of the thoracic spine. IMPRESSION: No acute abnormality of the lungs. Electronically Signed   By: Lauralyn Primes M.D.   On: 01/05/2020 12:33   CT Head Wo Contrast  Result Date: 01/05/2020 CLINICAL DATA:  74 year old male with history of head trauma presenting with headache. EXAM: CT HEAD WITHOUT CONTRAST TECHNIQUE: Contiguous axial images were obtained from the base of the skull through the vertex without intravenous contrast. COMPARISON:  Head CT 11/24/2019. FINDINGS: Brain: Mild cerebral atrophy. Patchy and confluent areas of decreased attenuation are noted throughout the deep and periventricular white matter of the cerebral hemispheres bilaterally, compatible with chronic microvascular ischemic disease. More well-defined area of low attenuation  in the occipital lobe white matter compatible with an area of encephalomalacia/gliosis related to old left occipital infarct. No evidence of acute infarction, hemorrhage, hydrocephalus, extra-axial collection or mass lesion/mass effect. Vascular: No hyperdense vessel or unexpected calcification. Skull: Normal. Negative for fracture or focal lesion. Sinuses/Orbits: No acute finding. Other: None. IMPRESSION: 1. No evidence of significant acute traumatic injury to the skull or brain. 2. Mild cerebral atrophy with chronic microvascular ischemic changes in the cerebral white matter and old left occipital lobe infarct, similar to the prior study, as above. Electronically Signed   By: Trudie Reed M.D.   On: 01/05/2020 14:47   MR BRAIN WO CONTRAST  Result Date: 01/07/2020 CLINICAL DATA:  Initial evaluation for acute encephalopathy, weakness and confusion. EXAM: MRI HEAD WITHOUT CONTRAST TECHNIQUE: Multiplanar, multiecho pulse sequences of the brain and surrounding structures were obtained without intravenous contrast. COMPARISON:  Comparison made with prior CT from 01/05/2020 as well as previous MRI from 08/28/2008. FINDINGS: Brain: Diffuse prominence of the CSF containing spaces compatible with generalized age-related cerebral atrophy. Mild scattered patchy T2/FLAIR hyperintensity within the periventricular deep white matter both cerebral hemispheres most likely related chronic microvascular disease, mild for age. Encephalomalacia and gliosis within the left temporal occipital region consistent with chronic left PCA territory infarct. No abnormal foci of restricted diffusion to suggest acute or  subacute ischemia. Gray-white matter differentiation maintained. No other areas of remote cortical infarction. No foci of susceptibility artifact to suggest acute or chronic intracranial hemorrhage. No mass lesion, midline shift or mass effect. Mild ventricular prominence related global parenchymal volume loss without  hydrocephalus. No extra-axial fluid collection. Pituitary gland suprasellar region within normal limits. Midline structures intact. Vascular: Major intracranial vascular flow voids are maintained. Skull and upper cervical spine: Craniocervical junction normal. Upper cervical spine within normal limits. Bone marrow signal intensity normal. No scalp soft tissue abnormality. Sinuses/Orbits: Globes and orbital soft tissues within normal limits. Mild scattered mucosal thickening noted within the ethmoidal air cells and sphenoid sinuses. Paranasal sinuses are otherwise clear. No significant mastoid effusion. Inner ear structures grossly normal. Other: None. IMPRESSION: 1. No acute intracranial abnormality. 2. Chronic left PCA territory infarct. 3. Underlying age-related cerebral atrophy with mild chronic microvascular ischemic disease. Electronically Signed   By: Rise Mu M.D.   On: 01/07/2020 01:47   CT ABDOMEN PELVIS W CONTRAST  Result Date: 01/06/2020 CLINICAL DATA:  Back pain and urinary incontinence EXAM: CT ABDOMEN AND PELVIS WITH CONTRAST TECHNIQUE: Multidetector CT imaging of the abdomen and pelvis was performed using the standard protocol following bolus administration of intravenous contrast. CONTRAST:  OMNIPAQUE IOHEXOL 300 MG/ML  SOLN COMPARISON:  None. FINDINGS: Lower chest: The visualized heart size within normal limits. No pericardial fluid/thickening. No hiatal hernia. The visualized portions of the lungs are clear. Hepatobiliary: The liver is normal in density without focal abnormality.The main portal vein is patent. No evidence of calcified gallstones, gallbladder wall thickening or biliary dilatation. Pancreas: Unremarkable. No pancreatic ductal dilatation or surrounding inflammatory changes. Spleen: Normal in size without focal abnormality. Adrenals/Urinary Tract: Both adrenal glands appear normal. The kidneys and collecting system appear normal without evidence of urinary tract  calculus or hydronephrosis. There is slight hyperenhancement of the bladder wall. Stomach/Bowel: The stomach, small bowel, and colon are normal in appearance. A moderate amount of stool seen within the rectum. No inflammatory changes, wall thickening, or obstructive findings.The appendix is normal. Vascular/Lymphatic: There are no enlarged mesenteric, retroperitoneal, or pelvic lymph nodes. Scattered aortic atherosclerotic calcifications are seen without aneurysmal dilatation. Reproductive: The prostate is unremarkable. Other: No evidence of abdominal wall mass or hernia. Musculoskeletal: No acute or significant osseous findings. Slight superior chronic compression deformities of the L1 and L4 vertebral bodies is seen. Grade 1 anterolisthesis of L5 on S1 with chronic bilateral pars defects. IMPRESSION: Mild hyperenhancement of the bladder wall which is nonspecific, however could be due to mild cystitis. Please correlate with patient's laboratory exam. Moderate amount of rectal stool. Aortic Atherosclerosis (ICD10-I70.0). Electronically Signed   By: Jonna Clark M.D.   On: 01/06/2020 05:59   CT L-SPINE NO CHARGE  Result Date: 01/06/2020 CLINICAL DATA:  Back pain EXAM: CT LUMBAR SPINE WITHOUT CONTRAST TECHNIQUE: Multidetector CT imaging of the lumbar spine was performed without intravenous contrast administration. Multiplanar CT image reconstructions were also generated. COMPARISON:  November 03, 2008 FINDINGS: Segmentation: There are 5 non-rib bearing lumbar type vertebral bodies with the last intervertebral disc space labeled as L5-S1. Alignment: There is a grade 1 anterolisthesis of L5 on S1 measuring 8 mm with chronic bilateral pars defects. Vertebrae: There is chronic slight superior compression deformity of the L1 vertebral body with less than 25% loss in height. Large anterior bridging osteophytes seen at T12-L1. There is been interval worsening of the superior compression deformity of the L4 vertebral body  with approximately 40% loss in vertebral  body height. There is calcifications seen along the anterior longitudinal ligament at L4-L5. No acute fracture, malalignment, or pathologic osseous lesions seen. Paraspinal and other soft tissues: The paraspinal soft tissues and visualized retroperitoneal structures are unremarkable. Scattered vascular calcifications are noted. The sacroiliac joints are intact. Disc levels: T12-L1:  No significant canal or neural foraminal narrowing. L1-L2:   No significant canal or neural foraminal narrowing. L2-L3:   No significant canal or neural foraminal narrowing. L3-L4: Facet arthrosis and a broad-based disc bulge are present which causes mild bilateral neural foraminal narrowing. L4-L5: There is facet arthrosis and a broad-based disc bulge which causes mild bilateral neural foraminal narrowing. L5-S1: There is disc uncovering with facet arthrosis which causes moderate left and mild right neural foraminal narrowing. IMPRESSION: Grade 1 anterolisthesis of L5 on S1 with chronic bilateral pars defects. Slight interval progression in the anterior superior compression deformities of the L1 and L4 vertebral bodies. No retropulsion of fragments. Lumbar spine spondylosis most notable at L5-S1 with moderate left neural foraminal narrowing. Electronically Signed   By: Jonna Clark M.D.   On: 01/06/2020 05:22     Subjective: Patient seen and examined.  Remains obtunded.  He is medicated for comfort.  No response.   Discharge Exam: Vitals:   01/08/20 1447 01/08/20 2143  BP: (!) 153/60 (!) 134/114  Pulse: 67 81  Resp: (!) 22 18  Temp: 99 F (37.2 C) 99.3 F (37.4 C)  SpO2: 100% 97%   Vitals:   01/07/20 2252 01/08/20 0543 01/08/20 1447 01/08/20 2143  BP: 129/64 (!) 124/100 (!) 153/60 (!) 134/114  Pulse: 84 77 67 81  Resp: 15 17 (!) 22 18  Temp: 100 F (37.8 C) 99.5 F (37.5 C) 99 F (37.2 C) 99.3 F (37.4 C)  TempSrc: Oral Axillary Oral   SpO2: 95% 98% 100% 97%  Weight:       Height:        General: Pt is lethargic and obtunded.  No response. Cardiovascular: RRR, S1/S2  Respiratory: CTA bilaterally, conducted airway sounds. Abdominal: Soft, NT, ND, bowel sounds + Extremities: no edema, no cyanosis    The results of significant diagnostics from this hospitalization (including imaging, microbiology, ancillary and laboratory) are listed below for reference.     Microbiology: Recent Results (from the past 240 hour(s))  Culture, Urine     Status: None   Collection Time: 01/05/20  5:19 PM   Specimen: Urine, Random  Result Value Ref Range Status   Specimen Description URINE, RANDOM  Final   Special Requests NONE  Final   Culture   Final    NO GROWTH Performed at [facility], [address], [place], [place] [address]    Report Status 01/07/2020 FINAL  Final  SARS CORONAVIRUS 2 (TAT 6-24 HRS)     Status: None   Collection Time: 01/06/20  4:50 AM  Result Value Ref Range Status   SARS Coronavirus 2 NEGATIVE NEGATIVE Final    Comment: (NOTE) SARS-CoV-2 target nucleic acids are NOT DETECTED. The SARS-CoV-2 RNA is generally detectable in upper and lower respiratory specimens during the acute phase of infection. Negative results do not preclude SARS-CoV-2 infection, do not rule out co-infections with other pathogens, and should not be used as the sole basis for treatment or other patient management decisions. Negative results must be combined with clinical observations, patient history, and epidemiological information. The expected result is Negative. Fact Sheet for Patients: HairSlick.no Fact Sheet for Healthcare Providers: quierodirigir.com This test  is not yet approved or cleared by the [place] and  has been authorized for detection and/or diagnosis of SARS-CoV-2 by FDA under an Emergency Use Authorization (EUA). This EUA will remain  in effect (meaning this test can be used)  for the duration of the COVID-19 declaration under Section 56 4(b)(1) of the Act, 21 U.S.C. section 360bbb-3(b)(1), unless the authorization is terminated or revoked sooner. Performed at [facility], [place] [address] [place], [place] [address]   Respiratory Panel by PCR     Status: None   Collection Time: 01/06/20  9:30 AM   Specimen: Nasopharyngeal Swab; Respiratory  Result Value Ref Range Status   Adenovirus NOT DETECTED NOT DETECTED Final   Coronavirus 229E NOT DETECTED NOT DETECTED Final    Comment: (NOTE) The Coronavirus on the Respiratory Panel, DOES NOT test for the novel  Coronavirus (2019 nCoV)    Coronavirus HKU1 NOT DETECTED NOT DETECTED Final   Coronavirus NL63 NOT DETECTED NOT DETECTED Final   Coronavirus OC43 NOT DETECTED NOT DETECTED Final   Metapneumovirus NOT DETECTED NOT DETECTED Final   Rhinovirus / Enterovirus NOT DETECTED NOT DETECTED Final   Influenza A NOT DETECTED NOT DETECTED Final   Influenza B NOT DETECTED NOT DETECTED Final   Parainfluenza Virus 1 NOT DETECTED NOT DETECTED Final   Parainfluenza Virus 2 NOT DETECTED NOT DETECTED Final   Parainfluenza Virus 3 NOT DETECTED NOT DETECTED Final   Parainfluenza Virus 4 NOT DETECTED NOT DETECTED Final   Respiratory Syncytial Virus NOT DETECTED NOT DETECTED Final   Bordetella pertussis NOT DETECTED NOT DETECTED Final   Chlamydophila pneumoniae NOT DETECTED NOT DETECTED Final   Mycoplasma pneumoniae NOT DETECTED NOT DETECTED Final    Comment: Performed at [facility], [place]. [address], [place], [place] [address]  Culture, blood (routine x 2)     Status: None (Preliminary result)   Collection Time: 01/06/20 10:19 AM   Specimen: BLOOD RIGHT HAND  Result Value Ref Range Status   Specimen Description BLOOD RIGHT HAND  Final   Special Requests   Final    BOTTLES DRAWN AEROBIC AND ANAEROBIC Blood Culture results may not be optimal due to an inadequate volume of blood received in culture bottles   Culture    Final    NO GROWTH 3 DAYS Performed at [facility], [place] [address], [place], [place] [address]    Report Status PENDING  Incomplete  Culture, blood (routine x 2)     Status: None (Preliminary result)   Collection Time: 01/06/20 10:19 AM   Specimen: BLOOD  Result Value Ref Range Status   Specimen Description BLOOD RIGHT ANTECUBITAL  Final   Special Requests   Final    BOTTLES DRAWN AEROBIC AND ANAEROBIC Blood Culture adequate volume   Culture   Final    NO GROWTH 3 DAYS Performed at [facility], [place] [address], [place], [place] [address]    Report Status PENDING  Incomplete     Labs: BNP (last 3 results) No results for input(s): BNP in the last 8760 hours. Basic Metabolic Panel: Recent Labs  Lab 01/05/20 1208 01/05/20 1618 01/06/20 1605 01/07/20 0147 01/08/20 0254  NA 137 139  --  138 139  K 4.6 4.2  --  3.7 3.3*  CL 104  --   --  103 104  CO2 22  --   --  21* 23  GLUCOSE 87  --   --  119*  102*  BUN 12  --   --  17 11  CREATININE 1.05  --   --  1.02 0.86  CALCIUM 8.6*  --   --  8.4* 8.1*  MG  --   --  1.7  --  1.6*  PHOS  --   --   --   --  2.4*   Liver Function Tests: Recent Labs  Lab 01/05/20 1208 01/08/20 0254  AST 38 46*  ALT 25 25  ALKPHOS 49 42  BILITOT 2.4* 1.7*  PROT 5.9* 5.3*  ALBUMIN 3.3* 2.6*   No results for input(s): LIPASE, AMYLASE in the last 168 hours. Recent Labs  Lab 01/05/20 1613  AMMONIA 17   CBC: Recent Labs  Lab 01/05/20 1208 01/05/20 1618 01/06/20 0411 01/07/20 0147 01/08/20 0254  WBC 7.5  --  9.6 11.0* 8.8  NEUTROABS 4.9  --   --   --  5.9  HGB 13.2 12.9* 13.9 13.1 12.1*  HCT 41.3 38.0* 42.0 38.6* 36.1*  MCV 96.5  --  94.4 92.1 92.6  PLT 116*  --  132* 120* 117*   Cardiac Enzymes: No results for input(s): CKTOTAL, CKMB, CKMBINDEX, TROPONINI in the last 168 hours. BNP: Invalid input(s): POCBNP CBG: Recent Labs  Lab 01/05/20 1206  GLUCAP 75   D-Dimer No results for input(s): DDIMER in the last 72  hours. Hgb A1c No results for input(s): HGBA1C in the last 72 hours. Lipid Profile No results for input(s): CHOL, HDL, LDLCALC, TRIG, CHOLHDL, LDLDIRECT in the last 72 hours. Thyroid function studies No results for input(s): TSH, T4TOTAL, T3FREE, THYROIDAB in the last 72 hours.  Invalid input(s): FREET3 Anemia work up No results for input(s): VITAMINB12, FOLATE, FERRITIN, TIBC, IRON, RETICCTPCT in the last 72 hours. Urinalysis    Component Value Date/Time   COLORURINE YELLOW 01/05/2020 1719   APPEARANCEUR CLEAR 01/05/2020 1719   LABSPEC 1.016 01/05/2020 1719   PHURINE 5.0 01/05/2020 1719   GLUCOSEU NEGATIVE 01/05/2020 1719   HGBUR SMALL (A) 01/05/2020 1719   BILIRUBINUR NEGATIVE 01/05/2020 1719   BILIRUBINUR negative 09/19/2018 1250   KETONESUR 20 (A) 01/05/2020 1719   PROTEINUR NEGATIVE 01/05/2020 1719   UROBILINOGEN 0.2 09/19/2018 1250   UROBILINOGEN 0.2 08/31/2015 1405   NITRITE NEGATIVE 01/05/2020 1719   LEUKOCYTESUR NEGATIVE 01/05/2020 1719   Sepsis Labs Invalid input(s): PROCALCITONIN,  WBC,  LACTICIDVEN Microbiology Recent Results (from the past 240 hour(s))  Culture, Urine     Status: None   Collection Time: 01/05/20  5:19 PM   Specimen: Urine, Random  Result Value Ref Range Status   Specimen Description URINE, RANDOM  Final   Special Requests NONE  Final   Culture   Final    NO GROWTH Performed at [facility], [address], [place], [place] [address]    Report Status 01/07/2020 FINAL  Final  SARS CORONAVIRUS 2 (TAT 6-24 HRS)     Status: None   Collection Time: 01/06/20  4:50 AM  Result Value Ref Range Status   SARS Coronavirus 2 NEGATIVE NEGATIVE Final    Comment: (NOTE) SARS-CoV-2 target nucleic acids are NOT DETECTED. The SARS-CoV-2 RNA is generally detectable in upper and lower respiratory specimens during the acute phase of infection. Negative results do not preclude SARS-CoV-2 infection, do not rule out co-infections with other pathogens,  and should not be used as the sole basis for treatment or other patient management decisions. Negative results must be combined with clinical observations, patient history, and  epidemiological information. The expected result is Negative. Fact Sheet for Patients: HairSlick.nohttps://www.fda.gov/media/138098/download Fact Sheet for Healthcare Providers: quierodirigir.comhttps://www.fda.gov/media/138095/download This test is not yet approved or cleared by the [place] FDA and  has been authorized for detection and/or diagnosis of SARS-CoV-2 by FDA under an Emergency Use Authorization (EUA). This EUA will remain  in effect (meaning this test can be used) for the duration of the COVID-19 declaration under Section 56 4(b)(1) of the Act, 21 U.S.C. section 360bbb-3(b)(1), unless the authorization is terminated or revoked sooner. Performed at [facility], [address] [place], [place] [address]   Respiratory Panel by PCR     Status: None   Collection Time: 01/06/20  9:30 AM   Specimen: Nasopharyngeal Swab; Respiratory  Result Value Ref Range Status   Adenovirus NOT DETECTED NOT DETECTED Final   Coronavirus 229E NOT DETECTED NOT DETECTED Final    Comment: (NOTE) The Coronavirus on the Respiratory Panel, DOES NOT test for the novel  Coronavirus (2019 nCoV)    Coronavirus HKU1 NOT DETECTED NOT DETECTED Final   Coronavirus NL63 NOT DETECTED NOT DETECTED Final   Coronavirus OC43 NOT DETECTED NOT DETECTED Final   Metapneumovirus NOT DETECTED NOT DETECTED Final   Rhinovirus / Enterovirus NOT DETECTED NOT DETECTED Final   Influenza A NOT DETECTED NOT DETECTED Final   Influenza B NOT DETECTED NOT DETECTED Final   Parainfluenza Virus 1 NOT DETECTED NOT DETECTED Final   Parainfluenza Virus 2 NOT DETECTED NOT DETECTED Final   Parainfluenza Virus 3 NOT DETECTED NOT DETECTED Final   Parainfluenza Virus 4 NOT DETECTED NOT DETECTED Final   Respiratory Syncytial Virus NOT DETECTED NOT DETECTED Final   Bordetella  pertussis NOT DETECTED NOT DETECTED Final   Chlamydophila pneumoniae NOT DETECTED NOT DETECTED Final   Mycoplasma pneumoniae NOT DETECTED NOT DETECTED Final    Comment: Performed at [facility], [address] [place], [place] [address]  Culture, blood (routine x 2)     Status: None (Preliminary result)   Collection Time: 01/06/20 10:19 AM   Specimen: BLOOD RIGHT HAND  Result Value Ref Range Status   Specimen Description BLOOD RIGHT HAND  Final   Special Requests   Final    BOTTLES DRAWN AEROBIC AND ANAEROBIC Blood Culture results may not be optimal due to an inadequate volume of blood received in culture bottles   Culture   Final    NO GROWTH 3 DAYS Performed at [facility], [address] [address] [place], [place] [address]    Report Status PENDING  Incomplete  Culture, blood (routine x 2)     Status: None (Preliminary result)   Collection Time: 01/06/20 10:19 AM   Specimen: BLOOD  Result Value Ref Range Status   Specimen Description BLOOD RIGHT ANTECUBITAL  Final   Special Requests   Final    BOTTLES DRAWN AEROBIC AND ANAEROBIC Blood Culture adequate volume   Culture   Final    NO GROWTH 3 DAYS Performed at [facility], [address] [place], [place] [address]    Report Status PENDING  Incomplete     Time coordinating discharge:  40 minutes  SIGNED:   Dorcas CarrowKuber Madalynne Gutmann, MD  [facility] 01/09/2020, 1:45 PM

## 2020-01-09 NOTE — TOC Transition Note (Signed)
Transition of Care [facility]) - CM/SW Discharge Note   Patient Details  Name: Logan Valdez. MRN: [id number] Date of Birth: 02-05-1946  Transition of Care [facility]) CM/SW Contact:  Doy Hutching, LCSW Phone Number: 01/09/2020, 5:19 PM   Clinical Narrative:    Discharge summary faxed to [facility]. RN has called report, PTAR scheduled as requested by [facility] for 8:30pm. Pt DNR is signed on chart along with PTAR papers.    Final next level of care: Hospice Medical Facility Barriers to Discharge: Barriers Resolved   Patient Goals and CMS Choice Patient states their goals for this hospitalization and ongoing recovery are:: comfort focus at this time CMS Medicare.gov Compare Post Acute Care list provided to:: Patient Represenative (must comment)(pt wife Logan Valdez) Choice offered to / list presented to : Spouse  Discharge Placement              Patient chooses bed at: Other - please specify in the comment section below:([facility]) Patient to be transferred to facility by: PTAR Name of family member notified: pt wife Logan Valdez Patient and family notified of of transfer: 01/09/20  Discharge Plan and Services In-house Referral: Clinical Social Work, Hospice / Palliative Care Discharge Planning Services: CM Consult Post Acute Care Choice: Hospice           Readmission Risk Interventions Readmission Risk Prevention Plan 01/06/2020  Post Dischage Appt Complete  Medication Screening Complete  Transportation Screening Complete  Some recent data might be hidden

## 2020-01-11 LAB — CULTURE, BLOOD (ROUTINE X 2)
Culture: NO GROWTH
Culture: NO GROWTH
Special Requests: ADEQUATE

## 2020-01-13 ENCOUNTER — Telehealth: Payer: Self-pay | Admitting: Family Medicine

## 2020-01-13 NOTE — Telephone Encounter (Signed)
Pt passed away this morning.  Called wife and expressed my condolences.

## 2020-01-21 ENCOUNTER — Telehealth: Payer: Self-pay

## 2020-01-21 NOTE — Telephone Encounter (Signed)
Received fax that pt was overdue for INR result. Contacted Christine at [employer] and advised pt has passed away. Christine verbalized understanding and reported meter will not need to be returned.

## 2020-02-05 DEATH — deceased

## 2020-03-02 ENCOUNTER — Telehealth: Payer: Self-pay

## 2020-03-02 NOTE — Telephone Encounter (Signed)
Logan Valdez was f/u about the faxes the clinic was receiving from [employer] after the pt had passed away. Advised the faxes have stopped.

## 2020-03-02 NOTE — Telephone Encounter (Signed)
Wynona Canes called to ask if Carollee Herter or the family has received any more notices from the company and to discuss some things with her. Please call back at [phone].
